# Patient Record
Sex: Female | Born: 1935 | Race: White | Hispanic: No | Smoking: Never smoker
Health system: Southern US, Community
[De-identification: ages and names within clinical notes are randomized; demographics above are authoritative.]

## PROBLEM LIST (undated history)

## (undated) DIAGNOSIS — I10 Essential (primary) hypertension: Secondary | ICD-10-CM

## (undated) DIAGNOSIS — G20A1 Parkinson's disease without dyskinesia, without mention of fluctuations: Secondary | ICD-10-CM

## (undated) DIAGNOSIS — G20 Parkinson's disease: Secondary | ICD-10-CM

## (undated) DIAGNOSIS — F0280 Dementia in other diseases classified elsewhere without behavioral disturbance: Secondary | ICD-10-CM

## (undated) DIAGNOSIS — M4800 Spinal stenosis, site unspecified: Secondary | ICD-10-CM

## (undated) DIAGNOSIS — F329 Major depressive disorder, single episode, unspecified: Secondary | ICD-10-CM

## (undated) DIAGNOSIS — Z8739 Personal history of other diseases of the musculoskeletal system and connective tissue: Secondary | ICD-10-CM

## (undated) DIAGNOSIS — M159 Polyosteoarthritis, unspecified: Secondary | ICD-10-CM

## (undated) DIAGNOSIS — F32A Depression, unspecified: Secondary | ICD-10-CM

## (undated) DIAGNOSIS — E039 Hypothyroidism, unspecified: Secondary | ICD-10-CM

## (undated) HISTORY — DX: Parkinson's disease without dyskinesia, without mention of fluctuations: G20.A1

## (undated) HISTORY — DX: Personal history of other diseases of the musculoskeletal system and connective tissue: Z87.39

## (undated) HISTORY — DX: Major depressive disorder, single episode, unspecified: F32.9

## (undated) HISTORY — DX: Parkinson's disease: G20

## (undated) HISTORY — DX: Dementia in other diseases classified elsewhere, unspecified severity, without behavioral disturbance, psychotic disturbance, mood disturbance, and anxiety: F02.80

## (undated) HISTORY — DX: Depression, unspecified: F32.A

## (undated) HISTORY — DX: Spinal stenosis, site unspecified: M48.00

## (undated) HISTORY — DX: Essential (primary) hypertension: I10

## (undated) HISTORY — DX: Hypothyroidism, unspecified: E03.9

## (undated) HISTORY — DX: Polyosteoarthritis, unspecified: M15.9

---

## 1975-01-21 HISTORY — PX: ABDOMINAL HYSTERECTOMY: SHX81

## 2004-01-21 HISTORY — PX: SPINE SURGERY: SHX786

## 2012-01-21 HISTORY — PX: SKIN GRAFT: SHX250

## 2012-01-21 HISTORY — PX: SKIN DEBRIDEMENT: SHX5235

## 2012-09-30 ENCOUNTER — Encounter: Payer: Self-pay | Admitting: *Deleted

## 2012-10-07 ENCOUNTER — Ambulatory Visit (INDEPENDENT_AMBULATORY_CARE_PROVIDER_SITE_OTHER): Payer: Medicare Other | Admitting: Internal Medicine

## 2012-10-07 ENCOUNTER — Encounter: Payer: Self-pay | Admitting: Internal Medicine

## 2012-10-07 VITALS — BP 160/78 | HR 82 | Temp 97.4°F | Resp 20 | Ht 63.19 in | Wt 217.8 lb

## 2012-10-07 DIAGNOSIS — M4800 Spinal stenosis, site unspecified: Secondary | ICD-10-CM

## 2012-10-07 DIAGNOSIS — F3289 Other specified depressive episodes: Secondary | ICD-10-CM

## 2012-10-07 DIAGNOSIS — F0280 Dementia in other diseases classified elsewhere without behavioral disturbance: Secondary | ICD-10-CM

## 2012-10-07 DIAGNOSIS — M159 Polyosteoarthritis, unspecified: Secondary | ICD-10-CM

## 2012-10-07 DIAGNOSIS — E039 Hypothyroidism, unspecified: Secondary | ICD-10-CM

## 2012-10-07 DIAGNOSIS — E669 Obesity, unspecified: Secondary | ICD-10-CM

## 2012-10-07 DIAGNOSIS — I10 Essential (primary) hypertension: Secondary | ICD-10-CM

## 2012-10-07 DIAGNOSIS — G20 Parkinson's disease: Secondary | ICD-10-CM

## 2012-10-07 DIAGNOSIS — L298 Other pruritus: Secondary | ICD-10-CM

## 2012-10-07 DIAGNOSIS — G20A1 Parkinson's disease without dyskinesia, without mention of fluctuations: Secondary | ICD-10-CM | POA: Insufficient documentation

## 2012-10-07 DIAGNOSIS — F329 Major depressive disorder, single episode, unspecified: Secondary | ICD-10-CM

## 2012-10-07 DIAGNOSIS — F32A Depression, unspecified: Secondary | ICD-10-CM

## 2012-10-07 DIAGNOSIS — F458 Other somatoform disorders: Secondary | ICD-10-CM

## 2012-10-07 DIAGNOSIS — Z23 Encounter for immunization: Secondary | ICD-10-CM

## 2012-10-07 NOTE — Progress Notes (Signed)
Patient ID: Andrea Dorsey, female   DOB: [birth year], 79 y.o.   MRN: [id number] Location:  [facility] / [place] [facility]  Code Status: DNR, daughter, Andrea Dorsey, is HCPOA and here with her today   Allergies  Allergen Reactions  . Ivp Dye [Iodinated Diagnostic Agents]     Only when intravenous, not on external skin.    Chief Complaint  Patient presents with  . Establish Care    itching in torso area,vaccine requested:flu, zostavax, pneumonia    HPI: Patient is a 79 y.o. black female seen in the office today to establish with the practice.  She previously lived in [place] and went to [facility].      Parkinson's 10-12 years ago.  Dementia only developed recently after hospitalization in March 2014 for necrotizing fasciitis--2 surgeries in 2 wks, then skin graft, then rehab for 4 mos--had considerable change in behavior.  Hallucinations--visual primarily.  Doesn't sleep hardly at all at night.  Some sundowning occurs.  Not falling lately--had 2 previous episodes of sliding down the side of the bed.  She has lived with her daughter and son in law off and on for several years.  She was staying off and on with her son, also living in [place].  Had cellulitis this past summer, as well in August--was in jaw cavity.  Had all teeth extracted.  Dr. Geryl Dorsey is dentist.  Had final check up last week.  Right now getting help with home health aide 3x weekly to help with bathing and dresssing.  PT/OT to come two times weekly.  Has nurse also coming to check on the wound which has healed.  Has made good progress with PT.  Mobility has improved by 50% per daughter, Andrea Dorsey.  Needs help dressing.  Is able to get out of lift chair and walk 50 feet on her own.  Andrea Dorsey is home health service.  Andrea Dorsey has taken her to a long term care facility to look.  She objects to this.  Her daughter is exhausted.  Biggest challenge is at night.  Does not want her on sleeping pills all night.  Having someone  there overnight and fresh resources in the day seem ideal.  House in [place] not set up for her needs, and caretaker full time could not offer adequate socialization.  Looked at [facility], [id number], [employer] in [place] (best option now--small, has memory care).    Has arthritis pain in knees.  Also has spinal stenosis--feels weak in her lower back--can't lay on her back.  Cannot pull herself up easily, but can get up using armrests.  Uses tylenol for pain.  Stopped Rx pain meds because they made her other problems (cognitive) worse.  Naproxen was affecting kidneys.    BP at home 128-132 systolic.  If over 140, is to take diovan.    Has been a while since cholesterol was checked.  Not on medication for it--had been borderline.  Sleeps only a few hours, then wakes up.  Has trouble truly sleeping again after that.  Her neurologist (Dr. Alma Dorsey at [facility]) prescribed a medication that started with c that would help her sleep.  Tried melatonin, but didn't help.  Occasionally trying to use melatonin.    Itching is diffuse.  Her daughter has tried different creams including antifungal, steroid, regular moisturizing cream of various brands without relief.  There is no rash.  There is a burning sensation with the itching and it moves around.  Her mood seems poor.  She does not have a diagnosis of depression up to this point.  I get the impression that she considers herself a burden to her family, but wants to move back to her original home and [place] and is not happy anywhere else.    Review of Systems:  Review of Systems  Constitutional: Positive for malaise/fatigue.  HENT: Positive for hearing loss. Negative for congestion and sore throat.   Eyes: Positive for blurred vision and photophobia. Negative for pain and redness.       New glasses 1.5 yrs ago "did not work" so she doesn't wear them, has excessive tearing that is worse in the mornings with light exposure  Respiratory:  Negative for cough and shortness of breath.   Cardiovascular: Positive for leg swelling. Negative for chest pain and palpitations.       Left greater than right  Gastrointestinal: Negative for heartburn, constipation, blood in stool and melena.  Genitourinary: Positive for urgency. Negative for dysuria and frequency.       Urge incontinence--uses depends sometimes--has good and bad days with urine control  Musculoskeletal: Positive for back pain and joint pain. Negative for myalgias and falls.  Skin: Positive for itching. Negative for rash.  Neurological: Positive for dizziness, tremors and weakness. Negative for sensory change, seizures, loss of consciousness and headaches.  Endo/Heme/Allergies: Does not bruise/bleed easily.  Psychiatric/Behavioral: Positive for depression and memory loss. The patient has insomnia. The patient is not nervous/anxious.     Past Medical History  Diagnosis Date  . Hypertension   . Thyroid disease   . Arthritis   . Parkinson disease   . Spinal stenosis     Past Surgical History  Procedure Laterality Date  . Skin debridement  2014    Brambhelt, MD  . Skin graft  2014    Brambhelt MD  . Spine surgery  2006    spinal stenosis  . Abdominal hysterectomy  1977    Social History:   reports that she has never smoked. She does not have any smokeless tobacco history on file. She reports that she does not drink alcohol or use illicit drugs.  Family History  Problem Relation Age of Onset  . Heart disease Mother   . Heart disease Sister   . Hypertension Sister   . Stroke Sister   . Heart disease Sister     heart attack  . Cancer Sister     colon    Medications: Patient's Medications  New Prescriptions   No medications on file  Previous Medications   ACETAMINOPHEN (TYLENOL) 500 MG TABLET    Take 500 mg by mouth every 6 (six) hours as needed for pain.   AMLODIPINE (NORVASC) 5 MG TABLET    Take 5 mg by mouth daily.   CARBIDOPA-LEVODOPA (SINEMET IR)  25-100 MG PER TABLET    Take 1 tablet by mouth 3 (three) times daily.   DM-APAP-CPM (CORICIDIN HBP FLU) 15-500-2 MG TABS    Take by mouth as needed.   ENTACAPONE (COMTAN) 200 MG TABLET    Take 200 mg by mouth 3 (three) times daily.   LEVOTHYROXINE (SYNTHROID, LEVOTHROID) 25 MCG TABLET    Take 25 mcg by mouth daily before breakfast.   METOPROLOL SUCCINATE (TOPROL-XL) 25 MG 24 HR TABLET    Take 25 mg by mouth daily.    METOPROLOL TARTRATE (LOPRESSOR) 25 MG TABLET    Take 25 mg by mouth daily.   NYSTATIN (MYCOSTATIN/NYSTOP) 100000 UNIT/GM POWD  Apply topically as needed.   RANITIDINE (ZANTAC) 150 MG CAPSULE    Take 150 mg by mouth as needed.    SILVER SULFADIAZINE (SILVADENE) 1 % CREAM    Apply topically as needed.    VALSARTAN (DIOVAN) 160 MG TABLET    Take 160 mg by mouth 2 (two) times daily.   Modified Medications   No medications on file  Discontinued Medications   MULTIPLE VITAMINS-MINERALS (MULTIVITAMIN WITH MINERALS) TABLET    Take 1 tablet by mouth daily.     Physical Exam: Filed Vitals:   10/07/12 0906  BP: 160/78  Pulse: 82  Temp: 97.4 F (36.3 C)  TempSrc: Oral  Resp: 20  Height: 5' 3.19" (1.605 m)  Weight: 217 lb 12.8 oz (98.793 kg)  SpO2: 99%  Physical Exam  Constitutional: No distress.  Obese black female in wheelchair here with her daughter  HENT:  Head: Normocephalic and atraumatic.  Right Ear: External ear normal.  Left Ear: External ear normal.  Nose: Nose normal.  Mouth/Throat: Oropharynx is clear and moist. No oropharyngeal exudate.  edentulous  Eyes: Conjunctivae and EOM are normal. Pupils are equal, round, and reactive to light. No scleral icterus.  Neck: Normal range of motion. No JVD present. No tracheal deviation present. No thyromegaly present.  Cardiovascular: Normal rate, regular rhythm, normal heart sounds and intact distal pulses.  Exam reveals no gallop and no friction rub.   No murmur heard. Pulmonary/Chest: Effort normal and breath sounds  normal. No respiratory distress.  Abdominal: Soft. Bowel sounds are normal. She exhibits no distension and no mass. There is no tenderness.  Musculoskeletal: Normal range of motion. She exhibits edema and tenderness.  Right leg with evidence of graft at ankle and tenderness there, left with 2+ pitting edema  Lymphadenopathy:    She has no cervical adenopathy.  Neurological: She is alert. No cranial nerve deficit. She exhibits abnormal muscle tone. Coordination abnormal.  Oriented to person and place, not time;  Mild cogwheeling rigidity of left upper extremity, resting tremor of left greater than right upper extremity, slight slowing and dyscoordination of left side vs. right  Skin: Skin is warm and dry. No rash noted. She is not diaphoretic.  Graft site right lower leg   Labs reviewed: No recent labs provided in records so far  Past Procedures: No recent procedures provided in records so far  Assessment/Plan 1. Parkinson disease - moderate at this time, is still able to ambulate within her home and in the office today, no dysphagia or hypersalivation - ? Whether her chronic pruritis is a neurogenic component related to her Parkinson's -referred to [facility] movement disorders specialist for chronic PD mgt--she is going to switch back to stalevo due to insurance costs at present and it is easier than two separate pills - CBC with Differential - Comprehensive metabolic panel - Vitamin D, 25-hydroxy - Ambulatory referral to Neurology  2. Spinal stenosis -chronic lumbar, worse in when lying down, no longer walks enough for it to bother her with increased activity -uses tylenol only due to adverse effects of other pain medications (see hpi)  3. Hypothyroidism -cont current synthroid dose and f/u TSH  4. Osteoarthritis, generalized -worst in knees, encouraged walking to help with pain  5. Benign essential hypertension -at goal at home--daughter monitors--advised to keep  <150/90 due to age, fall risk and suspected orthostatic hypotension at times  6. Depression - seems somewhat situational, but may also be due to her PD -not currently on medication  for this -no SI/HI -will monitor her moods due to risks of interactions with PD meds  7. Dementia in Parkinson's disease -will need to do MMSE at [facility] - Ambulatory referral to Neurology  -not on any dementia meds -has had visual hallucinations  8. Obesity (BMI 30-39.9) - will monitor weight--cannot do much exercise due to debility from PD, spinal stenosis and OA -does watch diet - Hemoglobin A1c - Lipid panel  9. Need for prophylactic vaccination and inoculation against influenza -given flu shot  10. Neurogenic pruritus -unclear etiology--seems she has no significant physical cause--I wonder if this is neurologic or psychologic at this point  Labs/tests ordered:  Cbc, cmp, flp, hba1c, tsh today Next appt: 6 wks for EV, MMSE

## 2012-10-07 NOTE — Progress Notes (Signed)
Patient ID: Andrea Dorsey, female   DOB: 15-Mar-1929, 79 y.o.   MRN: [id number] Location:  [facility]  Code Status:   Allergies  Allergen Reactions  . Ivp Dye [Iodinated Diagnostic Agents]     Only when intravenous, not on external skin.    Chief Complaint  Patient presents with  . Establish Care    itching in torso area,vaccine requested:flu, zostavax, pneumonia    HPI: Patient is a 79 y.o. black female seen in the office today to establish care.  Review of Systems:  ROS   Past Medical History  Diagnosis Date  . Hypertension   . Thyroid disease   . Arthritis   . Parkinson disease   . Spinal stenosis     Past Surgical History  Procedure Laterality Date  . Skin debridement  2014    Brambhelt, MD  . Skin graft  2014    Brambhelt MD  . Spine surgery  2006    spinal stenosis  . Abdominal hysterectomy  1977    Social History:   reports that she has never smoked. She does not have any smokeless tobacco history on file. She reports that she does not drink alcohol or use illicit drugs.  Family History  Problem Relation Age of Onset  . Heart disease Mother   . Heart disease Sister   . Hypertension Sister   . Stroke Sister   . Heart disease Sister     heart attack  . Cancer Sister     colon    Medications: Patient's Medications  New Prescriptions   No medications on file  Previous Medications   ACETAMINOPHEN (TYLENOL) 500 MG TABLET    Take 500 mg by mouth every 6 (six) hours as needed for pain.   AMLODIPINE (NORVASC) 5 MG TABLET    Take 5 mg by mouth daily.   CARBIDOPA-LEVODOPA (SINEMET IR) 25-100 MG PER TABLET    Take 1 tablet by mouth 3 (three) times daily.   DM-APAP-CPM (CORICIDIN HBP FLU) 15-500-2 MG TABS    Take by mouth as needed.   ENTACAPONE (COMTAN) 200 MG TABLET    Take 200 mg by mouth 3 (three) times daily.   LEVOTHYROXINE (SYNTHROID, LEVOTHROID) 25 MCG TABLET    Take 25 mcg by mouth daily before breakfast.    METOPROLOL SUCCINATE (TOPROL-XL) 25 MG 24 HR TABLET    Take 25 mg by mouth daily.    METOPROLOL TARTRATE (LOPRESSOR) 25 MG TABLET    Take 25 mg by mouth daily.   NYSTATIN (MYCOSTATIN/NYSTOP) 100000 UNIT/GM POWD    Apply topically as needed.   RANITIDINE (ZANTAC) 150 MG CAPSULE    Take 150 mg by mouth as needed.    SILVER SULFADIAZINE (SILVADENE) 1 % CREAM    Apply topically as needed.    VALSARTAN (DIOVAN) 160 MG TABLET    Take 160 mg by mouth 2 (two) times daily.   Modified Medications   No medications on file  Discontinued Medications   MULTIPLE VITAMINS-MINERALS (MULTIVITAMIN WITH MINERALS) TABLET    Take 1 tablet by mouth daily.     Physical Exam: Filed Vitals:   10/07/12 0906  BP: 160/78  Pulse: 82  Temp: 97.4 F (36.3 C)  TempSrc: Oral  Resp: 20  Height: 5' 3.19" (1.605 m)  Weight: 217 lb 12.8 oz (98.793 kg)  SpO2: 99%    Physical Exam   Labs reviewed:   Past Procedures:  Assessment/Plan No problem-specific assessment &  plan notes found for this encounter.    Labs/tests ordered: Next appt:

## 2012-10-07 NOTE — Patient Instructions (Signed)
May stop entacapone and sinemet and resume stalevo.   I recommend you establish with a neurologist here.   [facility] is a nice place to consider for long term care.   I recommend you only take diovan if systolic blood pressure is higher than 150 due to risk of orthostatic hypotension

## 2012-10-08 LAB — COMPREHENSIVE METABOLIC PANEL
ALT: 7 IU/L (ref 0–32)
AST: 15 IU/L (ref 0–40)
Albumin/Globulin Ratio: 1.8 (ref 1.1–2.5)
Albumin: 4 g/dL (ref 3.5–4.7)
Alkaline Phosphatase: 91 IU/L (ref 39–117)
BUN/Creatinine Ratio: 11 (ref 11–26)
BUN: 12 mg/dL (ref 8–27)
CO2: 22 mmol/L (ref 18–29)
Calcium: 9.9 mg/dL (ref 8.6–10.2)
Chloride: 104 mmol/L (ref 97–108)
Creatinine, Ser: 1.12 mg/dL — ABNORMAL HIGH (ref 0.57–1.00)
GFR calc Af Amer: 53 mL/min/{1.73_m2} — ABNORMAL LOW (ref >59)
GFR calc non Af Amer: 46 mL/min/{1.73_m2} — ABNORMAL LOW (ref >59)
Globulin, Total: 2.2 g/dL (ref 1.5–4.5)
Glucose: 105 mg/dL — ABNORMAL HIGH (ref 65–99)
Potassium: 4.2 mmol/L (ref 3.5–5.2)
Sodium: 142 mmol/L (ref 134–144)
Total Bilirubin: 0.9 mg/dL (ref 0.0–1.2)
Total Protein: 6.2 g/dL (ref 6.0–8.5)

## 2012-10-08 LAB — TSH: TSH: 3.7 u[IU]/mL (ref 0.450–4.500)

## 2012-10-08 LAB — LIPID PANEL
Chol/HDL Ratio: 3.5 ratio units (ref 0.0–4.4)
Cholesterol, Total: 207 mg/dL — ABNORMAL HIGH (ref 100–199)
HDL: 59 mg/dL (ref >39)
LDL Calculated: 133 mg/dL — ABNORMAL HIGH (ref 0–99)
Triglycerides: 74 mg/dL (ref 0–149)
VLDL Cholesterol Cal: 15 mg/dL (ref 5–40)

## 2012-10-08 LAB — CBC WITH DIFFERENTIAL/PLATELET
Basophils Absolute: 0 10*3/uL (ref 0.0–0.2)
Basos: 1 %
Eos: 3 %
Eosinophils Absolute: 0.2 10*3/uL (ref 0.0–0.4)
HCT: 39.1 % (ref 34.0–46.6)
Hemoglobin: 13.1 g/dL (ref 11.1–15.9)
Immature Grans (Abs): 0 10*3/uL (ref 0.0–0.1)
Immature Granulocytes: 0 %
Lymphocytes Absolute: 1.4 10*3/uL (ref 0.7–3.1)
Lymphs: 23 %
MCH: 31.2 pg (ref 26.6–33.0)
MCHC: 33.5 g/dL (ref 31.5–35.7)
MCV: 93 fL (ref 79–97)
Monocytes Absolute: 0.6 10*3/uL (ref 0.1–0.9)
Monocytes: 9 %
Neutrophils Absolute: 3.9 10*3/uL (ref 1.4–7.0)
Neutrophils Relative %: 64 %
RBC: 4.2 x10E6/uL (ref 3.77–5.28)
RDW: 14.7 % (ref 12.3–15.4)
WBC: 6.1 10*3/uL (ref 3.4–10.8)

## 2012-10-08 LAB — VITAMIN D 25 HYDROXY (VIT D DEFICIENCY, FRACTURES): Vit D, 25-Hydroxy: 21.6 ng/mL — ABNORMAL LOW (ref 30.0–100.0)

## 2012-10-08 LAB — HEMOGLOBIN A1C
Est. average glucose Bld gHb Est-mCnc: 114 mg/dL
Hgb A1c MFr Bld: 5.6 % (ref 4.8–5.6)

## 2012-10-11 ENCOUNTER — Ambulatory Visit (INDEPENDENT_AMBULATORY_CARE_PROVIDER_SITE_OTHER): Payer: Medicare Other | Admitting: Internal Medicine

## 2012-10-11 DIAGNOSIS — Z111 Encounter for screening for respiratory tuberculosis: Secondary | ICD-10-CM

## 2012-10-11 NOTE — Progress Notes (Signed)
Patient ID: Andrea Dorsey, female   DOB: 1929-04-16, 79 y.o.   MRN: [id number] PPD was placed today for TB screening by CMA.

## 2012-10-13 ENCOUNTER — Telehealth: Payer: Self-pay

## 2012-10-13 LAB — TB SKIN TEST
Induration: 0 mm
TB Skin Test: NEGATIVE

## 2012-10-13 NOTE — Telephone Encounter (Signed)
Please write pt's name and hospital bed on one of my blank scripts and I will sign this and add the diagnoses and reasons she needs it when I am there tomorrow so medicare will cover it.  They can pick up the prescription on Friday morning.

## 2012-10-13 NOTE — Telephone Encounter (Signed)
RX placed on ledge for provider. [name] (patient's daughter) aware to pick-up rx on Friday. [name] indicated she may decide to have rx faxed but she will call to let me know

## 2012-10-13 NOTE — Telephone Encounter (Signed)
Patient was here today for PPD Skin Test Reading. Patient's daughter request order for hospital bed. Patient's daughter would like to pick rx/order up when available. Please call, ok to leave detailed message on voicemail  Last OV 10/07/2012, please advise on request.

## 2012-10-14 NOTE — Telephone Encounter (Signed)
Spoke with patient's daughter, per patient's daughter- [facility]

## 2012-10-15 ENCOUNTER — Ambulatory Visit (INDEPENDENT_AMBULATORY_CARE_PROVIDER_SITE_OTHER): Payer: Medicare Other | Admitting: Neurology

## 2012-10-15 ENCOUNTER — Encounter: Payer: Self-pay | Admitting: Neurology

## 2012-10-15 VITALS — BP 154/72 | HR 87 | Temp 98.3°F | Ht 63.0 in | Wt 212.0 lb

## 2012-10-15 DIAGNOSIS — G4752 REM sleep behavior disorder: Secondary | ICD-10-CM

## 2012-10-15 DIAGNOSIS — G20A1 Parkinson's disease without dyskinesia, without mention of fluctuations: Secondary | ICD-10-CM

## 2012-10-15 DIAGNOSIS — G4733 Obstructive sleep apnea (adult) (pediatric): Secondary | ICD-10-CM

## 2012-10-15 DIAGNOSIS — F411 Generalized anxiety disorder: Secondary | ICD-10-CM

## 2012-10-15 DIAGNOSIS — F419 Anxiety disorder, unspecified: Secondary | ICD-10-CM

## 2012-10-15 DIAGNOSIS — F329 Major depressive disorder, single episode, unspecified: Secondary | ICD-10-CM

## 2012-10-15 DIAGNOSIS — F32A Depression, unspecified: Secondary | ICD-10-CM

## 2012-10-15 DIAGNOSIS — R413 Other amnesia: Secondary | ICD-10-CM

## 2012-10-15 DIAGNOSIS — G20 Parkinson's disease: Secondary | ICD-10-CM

## 2012-10-15 DIAGNOSIS — G4700 Insomnia, unspecified: Secondary | ICD-10-CM

## 2012-10-15 MED ORDER — QUETIAPINE FUMARATE 25 MG PO TABS: ORAL_TABLET | ORAL | Status: DC

## 2012-10-15 NOTE — Progress Notes (Signed)
Subjective:    Patient ID: Andrea Dorsey is a 79 y.o. female.  HPI  Huston Foley, MD, PhD [facility] [address] [address] [place], [place] [address]  Dear Dr. Renato Gails,  I saw your patient, Andrea Dorsey, upon your kind request in my neurologic clinic today for initial consultation of her Parkinson's disease. The patient is accompanied by her daughter, Andrea Dorsey, today. As you know, Ms. Lukasiewicz is a very pleasant 79 year old right-handed woman with an underlying medical history of spinal stenosis, hypothyroidism, osteoarthritis, hypertension, depression, obesity, who previously used to see doctors at [facility], when she lived in [place], [place]. She was diagnosed with Parkinson's about 12 years ago when she was still residing in [place]. and has had complications with memory loss, behavioral problems, hallucinations, sleep disruption. She needs assistance with her ADLs. She has been living with her daughter and son-in-law and they have looked into the possibility of a long-term care facility but the patient has been resistant. She has had problems at night including sundowning, confusion, inability to sleep. Her symptoms started on one side with tremors, but the patient is not sure which side. She is very quiet and states, she gets very nervous. She is initially not able to give much in the way of history at all, but then opens up. She has been on Stalevo for the past 2 years, and prior to that she was on C/L, and prior to that she was on Amantadine. It does not sound like she has tried a dopamine agonist or rasagiline in the past. She has been experiencing nausea with her PD medications and still has occasional nausea. In March she was in [place] visiting her son. She developed necrotizing fasciitis and needed debridement and grafting. She developed hallucinations at the time, but was on pain medications at the time, but the [facility] persisted beyond that. She also  started having memory loss then. She came back to [facility] in July, but developed cellulitis with complications in her jaw and needed all remaining teeth removed and had IV antibiotics. She has no FHx of PD, no dementia. She has no Hx of psychiatric premorbid illness. In 2006 she had back surgery. She has no exposure to chemicals, agent orange. She has been an anxious person. She is not able to sleep at night. She was tried on Ambien and amitriptyline. She has difficulty with sleep onset and sleep maintenance.  She has occasional urinary incontinence, occasional constipations. She snores, and needed to have a sleep study, but did not go. She has had some dream enactments and has slid out of bed.   She has had more anxiety and recently also some depression. She is very opposed to going into assisted living by her daughter and her son-in-law has visited multiple places and after her daughter took me aside later she revealed that they have actually finalized on an assisted living facility for the patient and that she will be transitioned in one week. The patient at times becomes tearful during her visit. She understands that she has a complex medical history and multiple issues and advanced Parkinson's disease. She understands that she is not safe to live by herself.  Her Past Medical History Is Significant For: Past Medical History  Diagnosis Date  . Benign essential hypertension   . Hypothyroidism   . Osteoarthritis, generalized   . Parkinson disease   . Spinal stenosis   . History of necrotizing fascIItis     left leg,  s/p debridement and graft  . Dementia in Parkinson's disease   . Depression     Her Past Surgical History Is Significant For: Past Surgical History  Procedure Laterality Date  . Skin debridement  2014    Brambhelt, MD  . Skin graft  2014    Brambhelt MD  . Spine surgery  2006    spinal stenosis  . Abdominal hysterectomy  1977    Her Family History Is Significant For: Family  History  Problem Relation Age of Onset  . Heart disease Mother   . Heart disease Sister   . Hypertension Sister   . Stroke Sister   . Heart disease Sister     heart attack  . Cancer Sister     colon    Her Social History Is Significant For: History   Social History  . Marital Status: Widowed    Spouse Name: N/A    Number of Children: N/A  . Years of Education: N/A   Social History Main Topics  . Smoking status: Never Smoker   . Smokeless tobacco: None  . Alcohol Use: No  . Drug Use: No  . Sexual Activity: None   Other Topics Concern  . None   Social History Narrative  . None    Her Allergies Are:  Allergies  Allergen Reactions  . Ivp Dye [Iodinated Diagnostic Agents]     Only when intravenous, not on external skin.  :   Her Current Medications Are:  Outpatient Encounter Prescriptions as of 10/15/2012  Medication Sig Dispense Refill  . acetaminophen (TYLENOL) 500 MG tablet Take 500 mg by mouth every 6 (six) hours as needed for pain.      Marland Kitchen amLODipine (NORVASC) 5 MG tablet Take 5 mg by mouth daily.      . carbidopa-levodopa-entacapone (STALEVO) 37.5-150-200 MG per tablet Take 1 tablet by mouth 4 (four) times daily.      Marland Kitchen levothyroxine (SYNTHROID, LEVOTHROID) 25 MCG tablet Take 25 mcg by mouth daily before breakfast.      . metoprolol succinate (TOPROL-XL) 25 MG 24 hr tablet Take 25 mg by mouth daily.       Marland Kitchen nystatin (MYCOSTATIN/NYSTOP) 100000 UNIT/GM POWD Apply topically as needed.      . ranitidine (ZANTAC) 150 MG capsule Take 150 mg by mouth as needed.       . entacapone (COMTAN) 200 MG tablet Take 200 mg by mouth 3 (three) times daily.      . metoprolol tartrate (LOPRESSOR) 25 MG tablet Take 25 mg by mouth daily.      . silver sulfADIAZINE (SILVADENE) 1 % cream Apply topically as needed.       . valsartan (DIOVAN) 160 MG tablet Take 160 mg by mouth 2 (two) times daily.       . [DISCONTINUED] carbidopa-levodopa (SINEMET IR) 25-100 MG per tablet Take 1 tablet  by mouth 3 (three) times daily.      . [DISCONTINUED] DM-APAP-CPM (CORICIDIN HBP FLU) 15-500-2 MG TABS Take by mouth as needed.       No facility-administered encounter medications on file as of 10/15/2012.  : Review of Systems:  Out of a complete 14 point review of systems, all are reviewed and negative with the exception of these symptoms as listed below:  Review of Systems  Constitutional: Positive for chills and activity change.  HENT: Positive for hearing loss.   Respiratory: Positive for shortness of breath.        Snoring  Cardiovascular: Positive  for leg swelling (ankles).  Gastrointestinal: Positive for nausea and constipation.       Incontinence  Endocrine: Positive for cold intolerance.  Genitourinary:       Incontinence  Musculoskeletal: Positive for myalgias, back pain, joint swelling, arthralgias and gait problem.  Skin: Positive for rash.  Neurological: Positive for dizziness, tremors and weakness.       Restless leg  Psychiatric/Behavioral: Positive for hallucinations, confusion, sleep disturbance and dysphoric mood. The patient is nervous/anxious.     Objective:  Neurologic Exam  Physical Exam Physical Examination:   Filed Vitals:   10/15/12 0830  BP: 154/72  Pulse: 87  Temp: 98.3 F (36.8 C)    General Examination: The patient is a very pleasant 79 y.o. female in no acute distress. She is obese.   HEENT: Normocephalic, atraumatic, pupils are equal, round and reactive to light and accommodation. Extraocular tracking shows moderate saccadic breakdown without nystagmus noted. There is limitation to upper gaze. There is mild decrease in eye blink rate. Hearing is impaired mildly. Tympanic membranes are clear bilaterally. Face is symmetric with moderate facial masking and normal facial sensation. There is no lip, neck or jaw tremor. Neck is moderately rigid with intact passive ROM. There are no carotid bruits on auscultation. Oropharynx exam reveals mild mouth  dryness. There is significant airway crowding noted d/t redundant soft palate and large tongue. Mallampati is class III. Tongue protrudes centrally and palate elevates symmetrically. There is mild drooling.   Chest: is clear to auscultation without wheezing, rhonchi or crackles noted.  Heart: sounds are regular and normal without murmurs, rubs or gallops noted.   Abdomen: is soft, non-tender and non-distended with normal bowel sounds appreciated on auscultation.  Extremities: There is 3+ pitting edema in the distal lower extremities bilaterally. Pedal pulses are intact. Chronic stasis-like changes are noted in the distal legs bilaterally with s/p skin grafting. There are no varicose veins.  Skin: is warm and dry with no trophic changes noted. Age-related changes are noted on the skin.   Musculoskeletal: exam reveals no obvious joint deformities, tenderness, joint swelling or erythema.  Neurologically:  Mental status: The patient is awake and alert, paying good  attention. She is able to partially provide the history. Her daughter provides details or most of the entire history. She is oriented to: person, place, time/date and situation. Her memory, attention, language and knowledge are impaired mildly. There is no aphasia, agnosia, apraxia or anomia. There is a moderate degree of bradyphrenia. Speech is moderately hypophonic with mild dysarthria noted. Mood is congruent and affect is normal.    Cranial nerves are as described above under HEENT exam. In addition, shoulder shrug is normal with equal shoulder height noted.  Motor exam: Normal bulk, and strength for age is noted. There are no dyskinesias noted.  Tone is mildly rigid with presence of cogwheeling in the right upper extremity. There is overall moderate bradykinesia. There is no drift or rebound.  There is an intermittent mild to moderate UE resting tremor.  Romberg is not tested.   Reflexes are 1+ in the upper extremities and trace in  the knees and absent in the ankle.   Fine motor skills exam: Finger taps are moderately impaired on the right and mildly impaired on the left. Hand movements are moderately impaired on the right and mildly impaired on the left. RAP (rapid alternating patting) is mildly impaired on the right and mildly impaired on the left. Foot taps are moderately impaired on the right  and moderately impaired on the left. Foot agility (in the form of heel stomping) is moderately impaired on the right and moderately impaired on the left.    Cerebellar testing shows no dysmetria or intention tremor on finger to nose testing. Heel to shin is unremarkable bilaterally. There is no truncal or gait ataxia.   Sensory exam is intact to light touch, pinprick, vibration, temperature sense and proprioception in the upper and lower extremities.   Gait, station and balance: She stands up from the seated position with mild difficulty and does need to push up with Her hands. She needs some assistance. No veering to one side is noted. She is not noted to lean to the side. Posture is moderately stooped. Stance is wide-based. She walks with decrease in stride length and pace and Has to rely on her rolling walker which she brought in today. Tandem walk is not possible. Balance is moderately impaired. She is not able to do a toe or heel stance.     Most of my 70 minute visit with them was spent in counseling and coordination of care, reviewing history and medications.  Assessment and Plan:   In summary, Heather Mckendree is a very pleasant 79 y.o.-year old female with an underlying medical history of spinal stenosis, hypothyroidism, osteoarthritis, hypertension, depression, obesity, necrotizing fasciitis of her right leg, history of cellulitis of her jaw, who presents to establish care for her Parkinson's disease which seems to be right-sided predominant and with symptoms dating back to about 12 years ago. Her PD is complicated by  hallucinations, mood disorder including anxiety and depression, sleep disorder including insomnia as well as obstructive sleep apnea as well as REM behavior disorder. I spent a very long time with the patient and her daughter today, discussing her symptoms, her advanced Parkinson's disease, or complications, were fall risk, her mood disorder and her sleep disorder at length. I suggested that she continue with Stalevo 4 times a day at this time. For her mood disorder I would like to hold off on any immune medication but have encouraged him to discuss with you next month during her appointment the possibility of getting started on low-dose antidepressant perhaps Lexapro or Effexor XR. In the interim, at this juncture, I think we are to try a small dose of Seroquel. Having said that, I am aware that antipsychotics have to be used very cautiously in the elderly. I advised her daughter in that regard as well including the slightly increased risk of death in the elderly when we use antipsychotic medications. Nevertheless, she is not a good candidate for Ambien and not a good candidate for a benzodiazepine I believe. To that end, I would like to introduce a cautious dose of Seroquel at 6.25 mg each night for a week then increase it to 12.5 mg each night thereafter. In a month or so we can consider increasing this to a whole pill which would be 25 mg. I have encouraged her daughter to call back in that regard. Down the road, we should embark on testing her for obstructive sleep apnea. She has a history of loud snoring and given her obesity and tight airway I do believe that she has some degree of obstructive sleep apnea. The patient in the past has been reluctant to pursue a sleep study. Before we pursue a sleep study at this point, I think we need to help her with her sleep. I believe the Seroquel could potentially help her with her nighttime hallucinations, her  RBD and her sleep consolidation at this moment. I would like  to see her back in 3 months from now, sooner if the need arises. She may have a difficult transition into her new living situation and that can potentially give her a setback as well. We have to be mindful of this. I do believe that she will benefit from an antidepressant with antianxiety her upper gaze. She and her daughter were in agreement. I answered all their questions today and have encouraged [name] to call in about a month to discuss her sleep and to potentially schedule a sleep study.  Thank you very much for allowing me to participate in the care of this nice patient. If I can be of any further assistance to you please do not hesitate to call me at [phone].  Sincerely,   Huston Foley, MD, PhD

## 2012-10-15 NOTE — Patient Instructions (Addendum)
I think overall you are doing fairly well but I do want to suggest a few things today:  Remember to drink plenty of fluid, eat healthy meals and do not skip any meals. Try to eat protein with a every meal and eat a healthy snack such as fruit or nuts in between meals. Try to keep a regular sleep-wake schedule and try to exercise daily, particularly in the form of walking, 20-30 minutes a day, if you can.   Engage in social activities in your community and with your family and try to keep up with current events by reading the newspaper or watching the news.   As far as your medications are concerned, I would like to suggest a trial of Seroquel 25 mg strength: Take 1/4 pill each night for 1 week, then 1/2 pill each night thereafter. We may go up to whole pill later. Side effects include sleepiness, confusion, personality changes, balance problems. Elderly on antipsychotics may have a 1.4 to 1.6 times higher change of death. Call in 1 month to report about sleep and scheduling a sleep study.   Based on your symptoms and your exam I believe you are at risk for obstructive sleep apnea or OSA, and I think we should proceed with a sleep study to determine whether you do or do not have OSA and how severe it is. If you have more than mild OSA, I want you to consider treatment with CPAP. Please remember, the risks and ramifications of moderate to severe obstructive sleep apnea or OSA are: Cardiovascular disease, including congestive heart failure, stroke, difficult to control hypertension, arrhythmias, and even type 2 diabetes has been linked to untreated OSA. Sleep apnea causes disruption of sleep and sleep deprivation in most cases, which, in turn, can cause recurrent headaches, problems with memory, mood, concentration, focus, and vigilance. Most people with untreated sleep apnea report excessive daytime sleepiness, which can affect their ability to drive. Please do not drive if you feel sleepy.  I will see you  back after your sleep study to go over the test results and where to go from there. We will call you after your sleep study and to set up an appointment at the time.    You may need an antidepressant, talk to Dr. Renato Gails about starting something like Lexapro or Effexor XR at your next visit on 11/15/12.   As far as diagnostic testing: we will do a sleep study in about a month.    I would like to see you back in 3 months, sooner if we need to. Please call us with any interim questions, concerns, problems, updates or refill requests.  Please also call us for any test results so we can go over those with you on the phone. Brett Canales is my clinical assistant and will answer any of your questions and relay your messages to me and also relay most of my messages to you.  Our phone number is [phone]. We also have an after hours call service for urgent matters and there is a physician on-call for urgent questions. For any emergencies you know to call 911 or go to the nearest emergency room.

## 2012-10-26 ENCOUNTER — Telehealth: Payer: Self-pay | Admitting: *Deleted

## 2012-10-26 NOTE — Telephone Encounter (Signed)
Patient daughter [name] called and wants to know if you finished the [place] Forms she dropped off to you last Thursday, she would like to pick these up.

## 2012-10-26 NOTE — Telephone Encounter (Signed)
I believe I did and I put them in the outgoing box.

## 2012-11-08 ENCOUNTER — Emergency Department (HOSPITAL_COMMUNITY)
Admission: EM | Admit: 2012-11-08 | Discharge: 2012-11-08 | Disposition: A | Discharge status: Home / Self Care | Payer: Medicare Other | Source: Home / Self Care | Attending: Family Medicine | Admitting: Family Medicine

## 2012-11-08 ENCOUNTER — Encounter (HOSPITAL_COMMUNITY): Payer: Self-pay | Admitting: Emergency Medicine

## 2012-11-08 ENCOUNTER — Emergency Department (HOSPITAL_COMMUNITY): Payer: Medicare Other

## 2012-11-08 ENCOUNTER — Inpatient Hospital Stay (HOSPITAL_COMMUNITY)
Admission: EM | Admit: 2012-11-08 | Discharge: 2012-11-11 | DRG: 292 | Disposition: A | Discharge status: Home Health | Payer: Medicare Other | Attending: Internal Medicine | Admitting: Internal Medicine

## 2012-11-08 DIAGNOSIS — F0280 Dementia in other diseases classified elsewhere without behavioral disturbance: Secondary | ICD-10-CM | POA: Diagnosis present

## 2012-11-08 DIAGNOSIS — B49 Unspecified mycosis: Secondary | ICD-10-CM | POA: Diagnosis present

## 2012-11-08 DIAGNOSIS — G20A1 Parkinson's disease without dyskinesia, without mention of fluctuations: Secondary | ICD-10-CM | POA: Diagnosis present

## 2012-11-08 DIAGNOSIS — F329 Major depressive disorder, single episode, unspecified: Secondary | ICD-10-CM

## 2012-11-08 DIAGNOSIS — I10 Essential (primary) hypertension: Secondary | ICD-10-CM | POA: Diagnosis present

## 2012-11-08 DIAGNOSIS — I5031 Acute diastolic (congestive) heart failure: Principal | ICD-10-CM

## 2012-11-08 DIAGNOSIS — I509 Heart failure, unspecified: Secondary | ICD-10-CM | POA: Diagnosis present

## 2012-11-08 DIAGNOSIS — N390 Urinary tract infection, site not specified: Secondary | ICD-10-CM | POA: Diagnosis present

## 2012-11-08 DIAGNOSIS — G20 Parkinson's disease: Secondary | ICD-10-CM | POA: Diagnosis present

## 2012-11-08 DIAGNOSIS — E039 Hypothyroidism, unspecified: Secondary | ICD-10-CM | POA: Diagnosis present

## 2012-11-08 DIAGNOSIS — E669 Obesity, unspecified: Secondary | ICD-10-CM

## 2012-11-08 DIAGNOSIS — F3289 Other specified depressive episodes: Secondary | ICD-10-CM | POA: Diagnosis present

## 2012-11-08 LAB — POCT URINALYSIS DIP (DEVICE)
Bilirubin Urine: NEGATIVE
Glucose, UA: NEGATIVE mg/dL
Ketones, ur: NEGATIVE mg/dL
Protein, ur: 30 mg/dL — AB
Specific Gravity, Urine: 1.025 (ref 1.005–1.030)
Urobilinogen, UA: 0.2 mg/dL (ref 0.0–1.0)
pH: 6.5 (ref 5.0–8.0)

## 2012-11-08 LAB — BASIC METABOLIC PANEL WITH GFR
BUN: 16 mg/dL (ref 6–23)
Calcium: 10.5 mg/dL (ref 8.4–10.5)
Chloride: 105 meq/L (ref 96–112)
Creatinine, Ser: 0.97 mg/dL (ref 0.50–1.10)
GFR calc Af Amer: 62 mL/min — ABNORMAL LOW (ref >90)
GFR calc non Af Amer: 53 mL/min — ABNORMAL LOW (ref >90)

## 2012-11-08 LAB — CREATININE, SERUM
Creatinine, Ser: 1.05 mg/dL (ref 0.50–1.10)
GFR calc Af Amer: 56 mL/min — ABNORMAL LOW (ref >90)
GFR calc non Af Amer: 48 mL/min — ABNORMAL LOW (ref >90)

## 2012-11-08 LAB — CBC
HCT: 38.4 % (ref 36.0–46.0)
Hemoglobin: 12.7 g/dL (ref 12.0–15.0)
MCH: 30 pg (ref 26.0–34.0)
MCH: 30.6 pg (ref 26.0–34.0)
MCHC: 33.1 g/dL (ref 30.0–36.0)
MCV: 90.6 fL (ref 78.0–100.0)
Platelets: 225 K/uL (ref 150–400)
Platelets: 205 10*3/uL (ref 150–400)
RBC: 4.24 MIL/uL (ref 3.87–5.11)
RBC: 3.92 MIL/uL (ref 3.87–5.11)
RDW: 15.1 % (ref 11.5–15.5)
WBC: 7.4 K/uL (ref 4.0–10.5)
WBC: 5.4 10*3/uL (ref 4.0–10.5)

## 2012-11-08 LAB — URINALYSIS, ROUTINE W REFLEX MICROSCOPIC
Bilirubin Urine: NEGATIVE
Glucose, UA: NEGATIVE mg/dL
Ketones, ur: NEGATIVE mg/dL
Nitrite: POSITIVE — AB
Protein, ur: NEGATIVE mg/dL
Urobilinogen, UA: 0.2 mg/dL (ref 0.0–1.0)
pH: 6.5 (ref 5.0–8.0)

## 2012-11-08 LAB — POCT I-STAT TROPONIN I: Troponin i, poc: 0.01 ng/mL (ref 0.00–0.08)

## 2012-11-08 LAB — BASIC METABOLIC PANEL
CO2: 21 mEq/L (ref 19–32)
Glucose, Bld: 102 mg/dL — ABNORMAL HIGH (ref 70–99)
Potassium: 4.4 mEq/L (ref 3.5–5.1)
Sodium: 139 mEq/L (ref 135–145)

## 2012-11-08 LAB — PRO B NATRIURETIC PEPTIDE: Pro B Natriuretic peptide (BNP): 1051 pg/mL — ABNORMAL HIGH (ref 0–450)

## 2012-11-08 LAB — URINE MICROSCOPIC-ADD ON

## 2012-11-08 LAB — TROPONIN I: Troponin I: <0.3 ng/mL (ref <0.30)

## 2012-11-08 MED ORDER — FUROSEMIDE 10 MG/ML IJ SOLN
20.0000 mg | Freq: Two times a day (BID) | INTRAMUSCULAR | Status: DC
  Administered 2012-11-09 – 2012-11-10 (×5): 20 mg via INTRAVENOUS
  Filled 2012-11-08 (×7): qty 2

## 2012-11-08 MED ORDER — DEXTROSE 5 % IV SOLN
1.0000 g | INTRAVENOUS | Status: DC
  Administered 2012-11-09 – 2012-11-10 (×2): 1 g via INTRAVENOUS
  Filled 2012-11-08 (×2): qty 10

## 2012-11-08 MED ORDER — SODIUM CHLORIDE 0.9 % IJ SOLN
3.0000 mL | Freq: Two times a day (BID) | INTRAMUSCULAR | Status: DC
  Administered 2012-11-09 – 2012-11-11 (×6): 3 mL via INTRAVENOUS

## 2012-11-08 MED ORDER — METOPROLOL SUCCINATE ER 25 MG PO TB24
25.0000 mg | ORAL_TABLET | Freq: Every day | ORAL | Status: DC
  Administered 2012-11-09 – 2012-11-11 (×3): 25 mg via ORAL
  Filled 2012-11-08 (×3): qty 1

## 2012-11-08 MED ORDER — SODIUM CHLORIDE 0.9 % IV BOLUS (SEPSIS): 1000.0000 mL | Freq: Once | INTRAVENOUS | Status: DC

## 2012-11-08 MED ORDER — ASPIRIN EC 81 MG PO TBEC
81.0000 mg | DELAYED_RELEASE_TABLET | Freq: Every day | ORAL | Status: DC
  Administered 2012-11-09 – 2012-11-11 (×3): 81 mg via ORAL
  Filled 2012-11-08 (×3): qty 1

## 2012-11-08 MED ORDER — CARBIDOPA-LEVODOPA-ENTACAPONE 37.5-150-200 MG PO TABS
1.0000 | ORAL_TABLET | Freq: Four times a day (QID) | ORAL | Status: DC
  Filled 2012-11-08 (×3): qty 1

## 2012-11-08 MED ORDER — LEVOTHYROXINE SODIUM 25 MCG PO TABS
25.0000 ug | ORAL_TABLET | Freq: Every day | ORAL | Status: DC
  Administered 2012-11-09 – 2012-11-11 (×3): 25 ug via ORAL
  Filled 2012-11-08 (×4): qty 1

## 2012-11-08 MED ORDER — ONDANSETRON HCL 4 MG/2ML IJ SOLN: 4.0000 mg | Freq: Four times a day (QID) | INTRAMUSCULAR | Status: DC | PRN

## 2012-11-08 MED ORDER — ACETAMINOPHEN 500 MG PO TABS
500.0000 mg | ORAL_TABLET | Freq: Four times a day (QID) | ORAL | Status: DC | PRN
  Administered 2012-11-10 – 2012-11-11 (×4): 500 mg via ORAL
  Filled 2012-11-08 (×4): qty 1

## 2012-11-08 MED ORDER — POTASSIUM CHLORIDE CRYS ER 20 MEQ PO TBCR: 20.0000 meq | EXTENDED_RELEASE_TABLET | Freq: Once | ORAL | Status: DC

## 2012-11-08 MED ORDER — SODIUM CHLORIDE 0.9 % IJ SOLN: 3.0000 mL | INTRAMUSCULAR | Status: DC | PRN

## 2012-11-08 MED ORDER — ENTACAPONE 200 MG PO TABS
200.0000 mg | ORAL_TABLET | Freq: Four times a day (QID) | ORAL | Status: DC
  Administered 2012-11-09 – 2012-11-11 (×10): 200 mg via ORAL
  Filled 2012-11-08 (×14): qty 1

## 2012-11-08 MED ORDER — SODIUM CHLORIDE 0.9 % IV SOLN: 250.0000 mL | INTRAVENOUS | Status: DC | PRN

## 2012-11-08 MED ORDER — ENOXAPARIN SODIUM 40 MG/0.4ML ~~LOC~~ SOLN
40.0000 mg | SUBCUTANEOUS | Status: DC
  Administered 2012-11-09 – 2012-11-10 (×3): 40 mg via SUBCUTANEOUS
  Filled 2012-11-08 (×4): qty 0.4

## 2012-11-08 MED ORDER — CARBIDOPA-LEVODOPA 25-100 MG PO TABS
1.5000 | ORAL_TABLET | Freq: Four times a day (QID) | ORAL | Status: DC
  Administered 2012-11-09 – 2012-11-11 (×11): 1.5 via ORAL
  Filled 2012-11-08 (×13): qty 1.5

## 2012-11-08 MED ORDER — FUROSEMIDE 10 MG/ML IJ SOLN
40.0000 mg | Freq: Once | INTRAMUSCULAR | Status: AC
  Administered 2012-11-08: 40 mg via INTRAVENOUS
  Filled 2012-11-08: qty 4

## 2012-11-08 MED ORDER — DEXTROSE 5 % IV SOLN
1.0000 g | INTRAVENOUS | Status: DC
  Filled 2012-11-08: qty 10

## 2012-11-08 MED ORDER — DEXTROSE 5 % IV SOLN
1.0000 g | Freq: Once | INTRAVENOUS | Status: AC
  Administered 2012-11-08: 1 g via INTRAVENOUS
  Filled 2012-11-08: qty 10

## 2012-11-08 NOTE — ED Notes (Signed)
C/o worsening BLE edema, SOB with minimal exertion x 1 week. Denies CP, cold, cough, fever, chills. Also reports dysuria & urinary frequency since Saturday.

## 2012-11-08 NOTE — ED Provider Notes (Signed)
Andrea Dorsey is a 79 y.o. female who presents to Urgent Care today for bilateral lower extremity edema associated with exertional dyspnea. Patient has had worsening lower extremity edema over the past 2 months. This week she developed exertional dyspnea. She denies any significant chest pain or shortness of breath at rest. Additionally she has had some urinary frequency urgency and burning with urination over the past week. She denies any fevers or chills nausea vomiting or diarrhea.  Her past medical history is significant due to dementia secondary to Parkinson's disease with a history of sundowning currently a resident of assisted-living.   She has had any echocardiogram in [place] approximately a year and a half ago which was reportedly normal. Additionally she has a history of necrotizing fasciitis   Past Medical History  Diagnosis Date  . Benign essential hypertension   . Hypothyroidism   . Osteoarthritis, generalized   . Parkinson disease   . Spinal stenosis   . History of necrotizing fascIItis     left leg, s/p debridement and graft  . Dementia in Parkinson's disease   . Depression    History  Substance Use Topics  . Smoking status: Never Smoker   . Smokeless tobacco: Not on file  . Alcohol Use: No   ROS as above Medications reviewed. No current facility-administered medications for this encounter.   Current Outpatient Prescriptions  Medication Sig Dispense Refill  . amLODipine (NORVASC) 5 MG tablet Take 5 mg by mouth daily.      . carbidopa-levodopa-entacapone (STALEVO) 37.5-150-200 MG per tablet Take 1 tablet by mouth 4 (four) times daily.      . entacapone (COMTAN) 200 MG tablet Take 200 mg by mouth 3 (three) times daily.      Marland Kitchen levothyroxine (SYNTHROID, LEVOTHROID) 25 MCG tablet Take 25 mcg by mouth daily before breakfast.      . metoprolol succinate (TOPROL-XL) 25 MG 24 hr tablet Take 25 mg by mouth daily.       . metoprolol tartrate (LOPRESSOR) 25 MG tablet Take  25 mg by mouth daily.      Marland Kitchen nystatin (MYCOSTATIN/NYSTOP) 100000 UNIT/GM POWD Apply topically as needed.      Marland Kitchen QUEtiapine (SEROQUEL) 25 MG tablet Take 1/4 pill each night for 1 week, then 1/2 pill each night thereafter.  30 tablet  5  . ranitidine (ZANTAC) 150 MG capsule Take 150 mg by mouth as needed.       . valsartan (DIOVAN) 160 MG tablet Take 160 mg by mouth 2 (two) times daily.       Marland Kitchen acetaminophen (TYLENOL) 500 MG tablet Take 500 mg by mouth every 6 (six) hours as needed for pain.      . silver sulfADIAZINE (SILVADENE) 1 % cream Apply topically as needed.         Exam:  BP 171/65  Pulse 77  Temp(Src) 98.1 F (36.7 C) (Oral)  Resp 20  SpO2 97% Gen: Well NAD HEENT: EOMI,  MMM Lungs: Slightly increased work of breathing. Bibasilar crackles are present no wheezing Heart: RRR no MRG Abd: NABS, NT, ND Exts: Bilateral 2+ lower extremity edema. The right lower extremity is abnormal and scarred secondary to fasciotomy due to necrotizing fasciitis.  Results for orders placed during the hospital encounter of 11/08/12 (from the past 24 hour(s))  POCT URINALYSIS DIP (DEVICE)     Status: Abnormal   Collection Time    11/08/12  1:38 PM      Result Value Range  Glucose, UA NEGATIVE  NEGATIVE mg/dL   Bilirubin Urine NEGATIVE  NEGATIVE   Ketones, ur NEGATIVE  NEGATIVE mg/dL   Specific Gravity, Urine 1.025  1.005 - 1.030   Hgb urine dipstick NEGATIVE  NEGATIVE   pH 6.5  5.0 - 8.0   Protein, ur 30 (*) NEGATIVE mg/dL   Urobilinogen, UA 0.2  0.0 - 1.0 mg/dL   Nitrite NEGATIVE  NEGATIVE   Leukocytes, UA TRACE (*) NEGATIVE   No results found.  Assessment and Plan: 79 y.o. female with  1) congestive heart failure symptoms. This is somewhat concerning.  I had a lengthy discussion with the patient, and her daughter.  I feel that she would benefit from a workup and evaluation in the emergency room. Ideally she probably would benefit from a proBNP, lab panel, and echo and EKG.  Additionally  she likely would benefit from a course of Lasix with followup with primary care provider.  2) patient's urinary symptoms are somewhat consistent with urinary tract infection.   Plan to transfer to the emergency room via shuttle as patient's vital signs are stable at rest.   Full CODE STATUS     Rodolph Bong, MD 11/08/12 1347

## 2012-11-08 NOTE — ED Provider Notes (Signed)
CSN: [id number]     Arrival date & time 11/08/12  1417 History   First MD Initiated Contact with Patient 11/08/12 1510     Chief Complaint  Patient presents with  . Shortness of Breath  . Urinary Tract Infection   (Consider location/radiation/quality/duration/timing/severity/associated sxs/prior Treatment) Patient is a 79 y.o. female presenting with shortness of breath. The history is provided by the patient, medical records and a caregiver (daughter). The history is limited by the condition of the patient (dementia).  Shortness of Breath Severity:  Moderate Onset quality:  Gradual Timing:  Intermittent Progression:  Partially resolved Chronicity:  Recurrent Context: activity   Relieved by:  Rest, sitting up and diuretics Worsened by:  Exertion Associated symptoms: no abdominal pain, no chest pain, no cough, no diaphoresis, no fever, no headaches, no hemoptysis, no neck pain, no rash, no sore throat, no sputum production, no vomiting and no wheezing   Risk factors: no hx of PE/DVT, no prolonged immobilization and no recent surgery     Past Medical History  Diagnosis Date  . Benign essential hypertension   . Hypothyroidism   . Osteoarthritis, generalized   . Parkinson disease   . Spinal stenosis   . History of necrotizing fascIItis     left leg, s/p debridement and graft  . Dementia in Parkinson's disease   . Depression    Past Surgical History  Procedure Laterality Date  . Skin debridement  2014    Brambhelt, MD  . Skin graft  2014    Brambhelt MD  . Spine surgery  2006    spinal stenosis  . Abdominal hysterectomy  1977   Family History  Problem Relation Age of Onset  . Heart disease Mother   . Heart disease Sister   . Hypertension Sister   . Stroke Sister   . Heart disease Sister     heart attack  . Cancer Sister     colon   History  Substance Use Topics  . Smoking status: Never Smoker   . Smokeless tobacco: Not on file  . Alcohol Use: No   OB History    Grav Para Term Preterm Abortions TAB SAB Ect Mult Living                 Review of Systems  Constitutional: Negative for fever and diaphoresis.  HENT: Negative for sore throat.   Respiratory: Positive for shortness of breath. Negative for cough, hemoptysis, sputum production and wheezing.   Cardiovascular: Negative for chest pain.  Gastrointestinal: Negative for vomiting and abdominal pain.  Musculoskeletal: Negative for arthralgias, myalgias, neck pain and neck stiffness.  Skin: Negative for color change, pallor, rash and wound.  Neurological: Negative for dizziness, weakness and headaches.  All other systems reviewed and are negative.    Allergies  Ivp dye  Home Medications   No current outpatient prescriptions on file. BP 121/52  Pulse 71  Temp(Src) 97.9 F (36.6 C) (Oral)  Resp 20  Ht 5\' 5"  (1.651 m)  Wt 233 lb 12.8 oz (106.051 kg)  BMI 38.91 kg/m2  SpO2 100% Physical Exam  Nursing note and vitals reviewed. Constitutional: She is oriented to person, place, and time. She appears well-developed. No distress.  Chronically ill appearing female in no apparent distress.  HENT:  Head: Normocephalic and atraumatic.  Mouth/Throat: Oropharynx is clear and moist. No oropharyngeal exudate.  Eyes: Conjunctivae and EOM are normal. Pupils are equal, round, and reactive to light.  Neck: Normal range of motion. Neck  supple.  Cardiovascular: Normal rate, regular rhythm and normal heart sounds.  Exam reveals no gallop and no friction rub.   No murmur heard. Pulmonary/Chest: Effort normal. No respiratory distress. She has decreased breath sounds in the right lower field and the left lower field. She has no wheezes. She has rales in the right lower field and the left lower field. She exhibits no tenderness.  Abdominal: Soft. She exhibits no distension. There is no tenderness.  Musculoskeletal: Normal range of motion. She exhibits edema. She exhibits no tenderness.  2+ edema to the  knees bilaterally. Postsurgical changes to the right lower extremity.  Lymphadenopathy:    She has no cervical adenopathy.  Neurological: She is alert and oriented to person, place, and time.  Skin: Skin is warm and dry. No rash noted. She is not diaphoretic.  Psychiatric: She has a normal mood and affect. Her behavior is normal. Judgment and thought content normal.    ED Course  Procedures (including critical care time) Labs Review Labs Reviewed  BASIC METABOLIC PANEL - Abnormal; Notable for the following:    Glucose, Bld 102 (*)    GFR calc non Af Amer 53 (*)    GFR calc Af Amer 62 (*)    All other components within normal limits  PRO B NATRIURETIC PEPTIDE - Abnormal; Notable for the following:    Pro B Natriuretic peptide (BNP) 1051.0 (*)    All other components within normal limits  URINALYSIS, ROUTINE W REFLEX MICROSCOPIC - Abnormal; Notable for the following:    Nitrite POSITIVE (*)    Leukocytes, UA SMALL (*)    All other components within normal limits  URINE MICROSCOPIC-ADD ON - Abnormal; Notable for the following:    Squamous Epithelial / LPF FEW (*)    Bacteria, UA MANY (*)    All other components within normal limits  CBC - Abnormal; Notable for the following:    HCT 35.4 (*)    All other components within normal limits  CREATININE, SERUM - Abnormal; Notable for the following:    GFR calc non Af Amer 48 (*)    GFR calc Af Amer 56 (*)    All other components within normal limits  CBC  TROPONIN I  TROPONIN I  TROPONIN I  BASIC METABOLIC PANEL  TSH  POCT I-STAT TROPONIN I   Imaging Review Dg Chest 2 View  11/08/2012   CLINICAL DATA:  Shortness of breath.  EXAM: CHEST  2 VIEW  COMPARISON:  None.  FINDINGS: The cardio pericardial silhouette is enlarged. Vascular congestion noted with associated interstitial pulmonary edema. Tiny bilateral pleural effusions. Imaged bony structures of the thorax are intact.  IMPRESSION: Cardiomegaly with interstitial pulmonary edema  and tiny bilateral pleural effusions.   Electronically Signed   By: Kennith Center M.D.   On: 11/08/2012 16:21    EKG Interpretation     Ventricular Rate:    PR Interval:    QRS Duration:   QT Interval:    QTC Calculation:   R Axis:     Text Interpretation:              MDM   1. CHF (congestive heart failure)   2. UTI (lower urinary tract infection)   3. Acute diastolic heart failure   4. Dementia in Parkinson's disease   5. Depression     79 year old female with a history of dementia, Parkinson's disease, hypertension who presents with dyspnea on exertion, lower extremity swelling, orthopnea, urinary frequency and  burning. Patient attempted to see her PCP, who was out of town. For that reason, she went to urgent care Center. At urgent care, point-of-care urinalysis was indeterminate. DG symptoms, she was felt to need evaluation for possible congestive heart failure. Patient does not carried a diagnosis of CHF, however during a admission earlier this year at a hospital in [place], she was started on Lasix at that time. At discharge, she was discontinued for unclear reasons. Since that time, she has had waxing and waning swelling of her lower extremities.Symptoms this particular time have been progressively worsening over the last 2 weeks.  On exam, decreased breath sounds bilaterally at the bases, however she has significant wheezes. No hypoxia on room air. 3+ pitting edema to the mid shin bilaterally. Based on symptoms, feel that CHF is most likely. Will evaluate with EKG, chest x-ray, labs. Patient is currently asymptomatic at rest, so the symptoms may be able to make be managed as an outpatient based on laboratory findings. No chest pain and doubt PE, ACS, pneumonia.  Chest x-ray returned showing cardiomegaly with interstitial edema. BNP of 1000 as well as chest x-ray reflective of CHF. Additionally, urinalysis shows nitrite positive urine concerning for UTI based on symptoms  and urinalysis. Rocephin given for UTI and Lasix IV given for CHF. Based on these findings as well as new onset CHF, consult to medicine for admission to the floor for new onset CHF. Admitted to the floor hemodynamically stable.  Patient was discussed with my attending, Dr. Oletta Lamas.   Dorna Leitz, MD 11/09/12 [phone]

## 2012-11-08 NOTE — ED Notes (Signed)
Admitting MD at bedside.

## 2012-11-08 NOTE — ED Provider Notes (Signed)
Medical screening examination/treatment/procedure(s) were conducted as a shared visit with non-physician practitioner(s) and myself.  I personally evaluated the patient during the encounter  Pt with exertional dyspnea, no CP, back pain.  Also symptoms of UTI.  Seen at urgent care and sent to the ED for further eval.  Pt is not actively hypoxic, but no prior h/o CHF.  Pt has cardiomegaly on CXR along with evidence of interstitial edema per radiologist.  Troponin is normal.  Slight UTI.  Plan is to give lasix for symptomatic treatment, would benefit from BP management, diuresis, abx for UTI and providing good follow up including cardiology evaluation at some point.  ECG at time 14:34 shows SR with PAC's and compensatory pause, at rate 70, normal axis, early R wave transition in lead V2, no ST or T wave abn's, acutely.    Gavin Pound. Oletta Lamas, MD 11/08/12 1610

## 2012-11-08 NOTE — ED Notes (Addendum)
Per daughter pt sent here from UC due to swelling to bilateral ankle and SOB x2 months that has increased x1 week, pt was taken to UC today due to burning with urination and urinary urgency and frequency starting Saturday. Pt is a resident of [facility], daughter took her to UC because her pcp was out of town and they were unable to get an order for a urine sample. Pt denies chest pain, fever, chills, or cough

## 2012-11-08 NOTE — ED Notes (Signed)
C/o urinary frequency and dysuria x couple of days.  Swelling in ankles x 1 wk. Sob/cough with walking/being active. Denies fever. Pt has increased h2o and drank cranberry juice with no relief.

## 2012-11-08 NOTE — H&P (Signed)
[facility] History and Physical  Andrea Dorsey ZOX:[id number] DOB: July 07, 1929 DOA: 11/08/2012  Referring physician: Dr Oletta Lamas PCP: Bufford Spikes, DO  Specialists: none  Chief Complaint: increase urinary frequency, SOB on exertion.   HPI: Andrea Dorsey is a 79 y.o. female with PMH significant for Hypertension, hypothyroidism, history of LE necrotizing fascitis S/P debridement and graft April 2014, Parkinson diseases, who presents complaining of worsening SOB for one 1 week, increase lower extremities edema, dyspnea on exertion, orthopnea. She denies chest pain, cough, fever. Patient use walker for ambulation, but has not been able to walk due to pain in her legs and dyspnea.  Patient has history of LE edema on and off for years. Swelling of lower extremities started again recently.  Patient also with increase urinary frequency, dysuria for last couples of days.  Per daughter patient has history of visual hallucinations and parkinson diseases.   Review of Systems: Negative except as per HPI.   Past Medical History  Diagnosis Date  . Benign essential hypertension   . Hypothyroidism   . Osteoarthritis, generalized   . Parkinson disease   . Spinal stenosis   . History of necrotizing fascIItis     left leg, s/p debridement and graft  . Dementia in Parkinson's disease   . Depression    Past Surgical History  Procedure Laterality Date  . Skin debridement  2014    Brambhelt, MD  . Skin graft  2014    Brambhelt MD  . Spine surgery  2006    spinal stenosis  . Abdominal hysterectomy  1977   Social History:  reports that she has never smoked. She does not have any smokeless tobacco history on file. She reports that she does not drink alcohol or use illicit drugs. she lives assistance living facility, [facility].   Allergies  Allergen Reactions  . Ivp Dye [Iodinated Diagnostic Agents]     Only when intravenous, not on external skin.    Family History  Problem Relation Age  of Onset  . Heart disease Mother   . Heart disease Sister   . Hypertension Sister   . Stroke Sister   . Heart disease Sister     heart attack  . Cancer Sister     colon    Prior to Admission medications   Medication Sig Start Date End Date Taking? Authorizing Provider  acetaminophen (TYLENOL) 500 MG tablet Take 500 mg by mouth every 6 (six) hours as needed for pain.   Yes Historical Provider, MD  amLODipine (NORVASC) 5 MG tablet Take 5 mg by mouth daily.   Yes Historical Provider, MD  carbidopa-levodopa-entacapone (STALEVO) 37.5-150-200 MG per tablet Take 1 tablet by mouth 4 (four) times daily.   Yes Historical Provider, MD  levothyroxine (SYNTHROID, LEVOTHROID) 25 MCG tablet Take 25 mcg by mouth daily before breakfast.   Yes Historical Provider, MD  metoprolol succinate (TOPROL-XL) 25 MG 24 hr tablet Take 25 mg by mouth daily.  08/22/12  Yes Historical Provider, MD  nystatin (MYCOSTATIN/NYSTOP) 100000 UNIT/GM POWD Apply topically as needed.   Yes Historical Provider, MD   Physical Exam: Filed Vitals:   11/08/12 1730  BP: 140/56  Pulse: 67  Temp:   Resp: 15   General Appearance:    Alert, cooperative, no distress, appears stated age  Head:    Normocephalic, without obvious abnormality, atraumatic  Eyes:    PERRL, conjunctiva/corneas clear, EOM's intact, fundi    benign, both eyes  Ears:    Normal TM's and  external ear canals, both ears  Nose:   Nares normal, septum midline, mucosa normal, no drainage    or sinus tenderness  Throat:   Lips, mucosa, and tongue normal; no dentition  Neck:   Supple, symmetrical, trachea midline, no adenopathy;    thyroid:  no enlargement/tenderness/nodules; no carotid   Bruit,  positive JVD plus 14  Back:     Symmetric, no curvature, ROM normal, no CVA tenderness  Lungs:      Bilateral crackles, sporadic wheezes, respirations unlabored  Chest Wall:    No tenderness or deformity   Heart:    Regular rate and rhythm, S1 and S2 normal, no murmur, rub    or gallop     Abdomen:     Soft, non-tender, bowel sounds active all four quadrants,    no masses, no organomegaly        Extremities:   Right LE with old scar, no erythema. Left LE plus 2 edema.   Pulses:   2+ and symmetric all extremities     Lymph nodes:   Cervical, supraclavicular, and axillary nodes normal  Neurologic:   CNII-XII intact, normal strength, sensation and reflexes    throughout      Labs on Admission:  Basic Metabolic Panel:  Recent Labs Lab 11/08/12 1532  NA 139  K 4.4  CL 105  CO2 21  GLUCOSE 102*  BUN 16  CREATININE 0.97  CALCIUM 10.5   Liver Function Tests: No results found for this basename: AST, ALT, ALKPHOS, BILITOT, PROT, ALBUMIN,  in the last 168 hours No results found for this basename: LIPASE, AMYLASE,  in the last 168 hours No results found for this basename: AMMONIA,  in the last 168 hours CBC:  Recent Labs Lab 11/08/12 1532  WBC 7.4  HGB 12.7  HCT 38.4  MCV 90.6  PLT 225   Cardiac Enzymes: No results found for this basename: CKTOTAL, CKMB, CKMBINDEX, TROPONINI,  in the last 168 hours  BNP (last 3 results)  Recent Labs  11/08/12 1532  PROBNP 1051.0*   CBG: No results found for this basename: GLUCAP,  in the last 168 hours  Radiological Exams on Admission: Dg Chest 2 View  11/08/2012   CLINICAL DATA:  Shortness of breath.  EXAM: CHEST  2 VIEW  COMPARISON:  None.  FINDINGS: The cardio pericardial silhouette is enlarged. Vascular congestion noted with associated interstitial pulmonary edema. Tiny bilateral pleural effusions. Imaged bony structures of the thorax are intact.  IMPRESSION: Cardiomegaly with interstitial pulmonary edema and tiny bilateral pleural effusions.   Electronically Signed   By: Kennith Center M.D.   On: 11/08/2012 16:21    EKG: Independently reviewed.  Assessment/Plan Active Problems:   Hypothyroidism   Benign essential hypertension   Dementia in Parkinson's disease   Acute diastolic heart  failure  1-Acute Heart Failure exacerbation: suspect diastolic. Presents with dyspnea, orthopnea, worsening LE edema, increase BNP at 1051, chest x ray with interstitial edema and pleural effusion. Admit to telemetry, cycle cardiac enzymes. IV lasix 20 mg BID. KCl as needed. Strict I and O, daily weight. Check ECHO. Hold Norvasc can worsen LE edema.   2-UTI: presents with increase urinary frequency, dysuria. UA with positive nitrates. Follow up urine culture. IV ceftriaxone.  3-Parkinson diseases: Continue with levodopa.  4-Hypothyroidism: continue with synthroid. Check TSH.  5-   Code Status: Full Code.  Family Communication: Care discussed with Daughter who is POA,.  Disposition Plan: expect 3 to 4  Days impatient.  Time spent: 65 minutes.   Payeton Germani [facility] Pager [phone]  If 7PM-7AM, please contact night-coverage [email or web address] Password [facility] 11/08/2012, 6:27 PM

## 2012-11-09 DIAGNOSIS — N390 Urinary tract infection, site not specified: Secondary | ICD-10-CM

## 2012-11-09 DIAGNOSIS — I10 Essential (primary) hypertension: Secondary | ICD-10-CM

## 2012-11-09 DIAGNOSIS — E669 Obesity, unspecified: Secondary | ICD-10-CM

## 2012-11-09 DIAGNOSIS — I379 Nonrheumatic pulmonary valve disorder, unspecified: Secondary | ICD-10-CM

## 2012-11-09 LAB — BASIC METABOLIC PANEL
CO2: 24 mEq/L (ref 19–32)
GFR calc Af Amer: 61 mL/min — ABNORMAL LOW (ref >90)
GFR calc non Af Amer: 53 mL/min — ABNORMAL LOW (ref >90)
Potassium: 3.9 mEq/L (ref 3.5–5.1)
Sodium: 142 mEq/L (ref 135–145)

## 2012-11-09 LAB — TROPONIN I: Troponin I: <0.3 ng/mL (ref <0.30)

## 2012-11-09 MED ORDER — HALOPERIDOL LACTATE 5 MG/ML IJ SOLN: 1.0000 mg | Freq: Four times a day (QID) | INTRAMUSCULAR | Status: DC | PRN

## 2012-11-09 MED ORDER — FUROSEMIDE 10 MG/ML IJ SOLN
INTRAMUSCULAR | Status: AC
  Filled 2012-11-09: qty 4

## 2012-11-09 MED ORDER — LISINOPRIL 5 MG PO TABS
5.0000 mg | ORAL_TABLET | Freq: Every day | ORAL | Status: DC
  Administered 2012-11-09 – 2012-11-11 (×3): 5 mg via ORAL
  Filled 2012-11-09 (×3): qty 1

## 2012-11-09 NOTE — Care Management Note (Signed)
    Page 1 of 2   11/09/2012     11:40:59 AM   CARE MANAGEMENT NOTE 11/09/2012  Patient:  [facility]   Account Number:  [id number]  Date Initiated:  11/09/2012  Documentation initiated by:  [facility]  Subjective/Objective Assessment:   79 yo female admitted with CHF//resident of [facility]     Action/Plan:   IV diurese// return to [facility] with HHPT through [facility]   Anticipated DC Date:  11/12/2012   Anticipated DC Plan:  HOME W HOME HEALTH SERVICES  In-house referral  Clinical Social Worker      DC Associate Professor  CM consult      [facility] Choice  HOME HEALTH   Choice offered to / List presented to:  C-1 Patient        HH arranged  HH-2 PT  HH-1 RN  HH-10 DISEASE MANAGEMENT      HH agency  [facility]   Status of service:  Completed, signed off Medicare Important Message given?   (If response is "NO", the following Medicare IM given date fields will be blank) Date Medicare IM given:   Date Additional Medicare IM given:    Discharge Disposition:    Per UR Regulation:    If discussed at Long Length of Stay Meetings, dates discussed:    Comments:  11/09/12 1115 Oletta Cohn, RN, BSN, [employer] [phone] Spoke with pt at bedside regarding discharge planning.  Pt a resident of [facility].  Physical Therapy reccommends HHPT; NCM added RN for disease management.  Pt given list of HH providers.  Pt states that she will accept preferred agency of ALF.  NCM called ALF to verify that preferred [facility] agency is [facility]  Corrie Dandy of [facility] notified.

## 2012-11-09 NOTE — Evaluation (Signed)
Physical Therapy Evaluation Patient Details Name: Andrea Dorsey MRN: [id number] DOB: 07-12-1929 Today's Date: 11/09/2012 Time: 7829-5621 PT Time Calculation (min): 32 min  PT Assessment / Plan / Recommendation History of Present Illness  Andrea Dorsey is a 79 y.o. female with PMH significant for Hypertension, hypothyroidism, history of LE necrotizing fascitis S/P debridement and graft April 2014, Parkinson diseases, who presents complaining of worsening SOB for one 1 week, increase lower extremities edema, dyspnea on exertion, orthopnea. She denies chest pain, cough, fever. Patient use walker for ambulation, but has not been able to walk due to pain in her legs and dyspnea.    Clinical Impression  Pt admitted with dyspnea and increase LE swelling. Pt currently with functional limitations due to the deficits listed below (see PT Problem List). Pt poor historian and unsure of level assistance provided at d/c.  Per RN pt is from ALF.  Will continue to assess best d/c plans. If ALF able to provide appropriate assistance then may return with HHPT.  Pt will benefit from skilled PT to increase their independence and safety with mobility to allow discharge to the venue listed below.      PT Assessment  Patient needs continued PT services    Follow Up Recommendations  Home health PT (@ ALF if accurate information) - need to further assess overall caregiver support    Equipment Recommendations  None recommended by PT    Frequency Min 3X/week    Precautions / Restrictions Precautions Precautions: Fall Restrictions Weight Bearing Restrictions: No   Pertinent Vitals/Pain C/o left flank pain but does not rate      Mobility  Bed Mobility Bed Mobility: Supine to Sit;Sitting - Scoot to Edge of Bed Supine to Sit: 4: Min assist;With rails;HOB elevated Sitting - Scoot to [place] of Bed: 3: Mod assist;With rail Details for Bed Mobility Assistance: (A) to elevate trunk OOB and (A) with pad to scoot  hips to EOB.  Transfers Transfers: Sit to Stand;Stand to Sit;Stand Pivot Transfers Sit to Stand: 3: Mod assist;From bed;From chair/3-in-1 Stand to Sit: 4: Min assist;To chair/3-in-1 Stand Pivot Transfers: 3: Mod assist;From elevated surface Details for Transfer Assistance: Pt able to improve with sit <> stand after several trials.  Pt initial mod (A) and able to improve to min (A).  Max cues for hand placement and proper technique.  Decrease (A) needed with higher surfaces.  Pt reports having lift chair at thome.  Ambulation/Gait Ambulation/Gait Assistance: 4: Min assist Ambulation Distance (Feet): 20 Feet Assistive device: Rolling walker Ambulation/Gait Assistance Details: (A) to maintain balance especially with turns and max cues for RW placement and body position within RW.  Gait Pattern: Step-through pattern;Decreased stride length;Wide base of support;Trunk flexed Gait velocity: decreased Stairs: No    Exercises     PT Diagnosis: Difficulty walking;Generalized weakness  PT Problem List: Decreased strength;Decreased activity tolerance;Decreased balance;Decreased mobility;Decreased knowledge of use of DME;Cardiopulmonary status limiting activity;Pain PT Treatment Interventions: DME instruction;Gait training;Functional mobility training;Therapeutic activities;Therapeutic exercise;Balance training;Cognitive remediation;Patient/family education     PT Goals(Current goals can be found in the care plan section) Acute Rehab PT Goals Patient Stated Goal: To get washed up.  PT Goal Formulation: Patient unable to participate in goal setting Time For Goal Achievement: 11/16/12 Potential to Achieve Goals: Good  Visit Information  Last PT Received On: 11/09/12 Assistance Needed: +1 History of Present Illness: Andrea Dorsey is a 80 y.o. female with PMH significant for Hypertension, hypothyroidism, history of LE necrotizing fascitis S/P debridement and graft April 2014,  Parkinson diseases,  who presents complaining of worsening SOB for one 1 week, increase lower extremities edema, dyspnea on exertion, orthopnea. She denies chest pain, cough, fever. Patient use walker for ambulation, but has not been able to walk due to pain in her legs and dyspnea.         Prior Functioning  Home Living Family/patient expects to be discharged to:: Assisted living Home Equipment: Walker - 2 wheels Additional Comments: Pt very inconsistent with home environment and PLOF.  No family present at this time.  Pt did report having and RN to assist her with ADLs and overall mobility.  Prior Function Level of Independence: Needs assistance ADL's / Homemaking Assistance Needed: Pt reports having RN to assist with ADLS.  However pt consistent with answers.  Communication Communication: No difficulties    Cognition  Cognition Arousal/Alertness: Awake/alert Behavior During Therapy: WFL for tasks assessed/performed Overall Cognitive Status: No family/caregiver present to determine baseline cognitive functioning (per chart dementia) Memory: Decreased short-term memory    Extremity/Trunk Assessment Lower Extremity Assessment Lower Extremity Assessment: Generalized weakness   Balance Balance Balance Assessed: Yes Static Standing Balance Static Standing - Balance Support: Bilateral upper extremity supported Static Standing - Level of Assistance: 5: Stand by assistance Static Standing - Comment/# of Minutes: ~1 minutes while pt needed total (A) to complete pericare  End of Session PT - End of Session Equipment Utilized During Treatment: Gait belt Activity Tolerance: Patient limited by fatigue Patient left: in chair;with call bell/phone within reach Nurse Communication: Mobility status  GP     [name] 11/09/2012, 11:17 AM  Jake Shark, PT DPT [phone]

## 2012-11-09 NOTE — Progress Notes (Signed)
[facility] PROGRESS NOTE Assessment/Plan:  Acute diastolic heart failure: - estimated dry weight 212 lb, now 225 lb cont daily weight. - cont lasix replete electrolytes as needed. - echo pending, no events on telemetry, strict I and o's daily weight. - cont metoprolol and lisinopril.  UTI: - Rocephin UC pending.  Hypothyroidism - cont synthroid.  Benign essential hypertension -  mildly high add lisinopril  Dementia in Parkinson's disease - cont meds    Code Status: Full Code.  Family Communication: Care discussed with Daughter who is POA,.  Disposition Plan: expect 3 to 4 Days impatient.     Consultants:  none  Procedures:  Echo pending  Antibiotics:  rocephin (indicate start date, and stop date if known)  HPI/Subjective: Breathing is better.  Objective: Filed Vitals:   11/08/12 1830 11/08/12 1939 11/09/12 0148 11/09/12 0504  BP: 140/63 121/52 125/62 138/54  Pulse: 70 71 68 64  Temp:  97.9 F (36.6 C)  98.5 F (36.9 C)  TempSrc:  Oral  Oral  Resp: 24 20 20 20   Height:  5\' 5"  (1.651 m)    Weight:  106.051 kg (233 lb 12.8 oz)  102.059 kg (225 lb)  SpO2: 96% 100% 98% 97%    Intake/Output Summary (Last 24 hours) at 11/09/12 0935 Last data filed at 11/09/12 0825  Gross per 24 hour  Intake    480 ml  Output    200 ml  Net    280 ml   Filed Weights   11/08/12 1437 11/08/12 1939 11/09/12 0504  Weight: 96.163 kg (212 lb) 106.051 kg (233 lb 12.8 oz) 102.059 kg (225 lb)    Exam:  General: Alert, awake, oriented x3, in no acute distress.  HEENT: No bruits, no goiter. No JVD Heart: Regular rate and rhythm, without murmurs, rubs, gallops.  Lungs: Good air movement, clear to auscultion  Abdomen: Soft, nontender, nondistended, positive bowel sounds.  Neuro: Grossly intact, nonfocal.   Data Reviewed: Basic Metabolic Panel:  Recent Labs Lab 11/08/12 1532 11/08/12 1940 11/09/12 0530  NA 139  --  142  K 4.4  --  3.9  CL 105  --  107  CO2  21  --  24  GLUCOSE 102*  --  108*  BUN 16  --  15  CREATININE 0.97 1.05 0.98  CALCIUM 10.5  --  9.4   Liver Function Tests: No results found for this basename: AST, ALT, ALKPHOS, BILITOT, PROT, ALBUMIN,  in the last 168 hours No results found for this basename: LIPASE, AMYLASE,  in the last 168 hours No results found for this basename: AMMONIA,  in the last 168 hours CBC:  Recent Labs Lab 11/08/12 1532 11/08/12 1940  WBC 7.4 5.4  HGB 12.7 12.0  HCT 38.4 35.4*  MCV 90.6 90.3  PLT 225 205   Cardiac Enzymes:  Recent Labs Lab 11/08/12 1827 11/09/12 0115 11/09/12 0530  TROPONINI <0.30 <0.30 <0.30   BNP (last 3 results)  Recent Labs  11/08/12 1532  PROBNP 1051.0*   CBG: No results found for this basename: GLUCAP,  in the last 168 hours  No results found for this or any previous visit (from the past 240 hour(s)).   Studies: Dg Chest 2 View  11/08/2012   CLINICAL DATA:  Shortness of breath.  EXAM: CHEST  2 VIEW  COMPARISON:  None.  FINDINGS: The cardio pericardial silhouette is enlarged. Vascular congestion noted with associated interstitial pulmonary edema. Tiny bilateral pleural effusions. Imaged bony structures of  the thorax are intact.  IMPRESSION: Cardiomegaly with interstitial pulmonary edema and tiny bilateral pleural effusions.   Electronically Signed   By: Kennith Center M.D.   On: 11/08/2012 16:21    Scheduled Meds: . aspirin EC  81 mg Oral Daily  . carbidopa-levodopa  1.5 tablet Oral QID   And  . entacapone  200 mg Oral QID  . cefTRIAXone (ROCEPHIN)  IV  1 g Intravenous Q24H  . enoxaparin (LOVENOX) injection  40 mg Subcutaneous Q24H  . furosemide      . furosemide  20 mg Intravenous BID  . levothyroxine  25 mcg Oral QAC breakfast  . metoprolol succinate  25 mg Oral Daily  . potassium chloride  20 mEq Oral Once  . sodium chloride  3 mL Intravenous Q12H   Continuous Infusions:    Marinda Elk  [facility] Pager [phone]. If 8PM-8AM,  please contact night-coverage at [email or web address], password [facility] 11/09/2012, 9:35 AM  LOS: 1 day

## 2012-11-09 NOTE — Progress Notes (Signed)
  Echocardiogram 2D Echocardiogram has been performed.  Andrea Dorsey 11/09/2012, 8:46 AM

## 2012-11-10 DIAGNOSIS — E039 Hypothyroidism, unspecified: Secondary | ICD-10-CM

## 2012-11-10 LAB — BASIC METABOLIC PANEL
BUN: 16 mg/dL (ref 6–23)
CO2: 26 mEq/L (ref 19–32)
Calcium: 9.5 mg/dL (ref 8.4–10.5)
Chloride: 106 mEq/L (ref 96–112)
Creatinine, Ser: 1.06 mg/dL (ref 0.50–1.10)
GFR calc Af Amer: 56 mL/min — ABNORMAL LOW (ref >90)
GFR calc non Af Amer: 48 mL/min — ABNORMAL LOW (ref >90)
Glucose, Bld: 101 mg/dL — ABNORMAL HIGH (ref 70–99)
Potassium: 3.6 mEq/L (ref 3.5–5.1)

## 2012-11-10 MED ORDER — TRIAMCINOLONE ACETONIDE 0.1 % EX CREA
TOPICAL_CREAM | Freq: Two times a day (BID) | CUTANEOUS | Status: DC
  Administered 2012-11-10 – 2012-11-11 (×2): via TOPICAL
  Filled 2012-11-10: qty 15

## 2012-11-10 NOTE — Progress Notes (Signed)
Physical Therapy Treatment Patient Details Name: Andrea Dorsey MRN: [id number] DOB: 20-May-1929 Today's Date: 11/10/2012 Time: 1015-1040 PT Time Calculation (min): 25 min  PT Assessment / Plan / Recommendation  History of Present Illness Andrea Dorsey is a 79 y.o. female with PMH significant for Hypertension, hypothyroidism, history of LE necrotizing fascitis S/P debridement and graft April 2014, Parkinson diseases, who presents complaining of worsening SOB for one 1 week, increase lower extremities edema, dyspnea on exertion, orthopnea. She denies chest pain, cough, fever. Patient use walker for ambulation, but has not been able to walk due to pain in her legs and dyspnea.     PT Comments   *Pt progressing with mobility, increased ambulation distance today. Performed BLE exercises. **  Follow Up Recommendations  Home health PT (@ ALF if accurate information)     Does the patient have the potential to tolerate intense rehabilitation     Barriers to Discharge        Equipment Recommendations  None recommended by PT    Recommendations for Other Services    Frequency Min 3X/week   Progress towards PT Goals Progress towards PT goals: Progressing toward goals  Plan Current plan remains appropriate    Precautions / Restrictions Precautions Precautions: Fall Restrictions Weight Bearing Restrictions: No   Pertinent Vitals/Pain *Pt reports B antecubital fossa (previous IV sites) pain and itching. RN notified. **    Mobility  Transfers Transfers: Sit to Stand;Stand to Sit Sit to Stand: From chair/3-in-1;4: Min assist Stand to Sit: 4: Min assist;To chair/3-in-1 Details for Transfer Assistance: Min A to rise and for balance, pt initially had posterior lean in standing with RW. Sit to stand x 2. VCs for hand placement. Ambulation/Gait Ambulation/Gait Assistance: 4: Min assist Ambulation Distance (Feet): 35 Feet Assistive device: Rolling walker Gait Pattern: Step-through  pattern;Decreased stride length;Wide base of support;Trunk flexed Gait velocity: decreased Stairs: No    Exercises General Exercises - Lower Extremity Ankle Circles/Pumps: AROM;Both;10 reps;Seated Long Arc Quad: AROM;Both;20 reps;Seated Hip Flexion/Marching: AROM;Both;10 reps;Seated   PT Diagnosis:    PT Problem List:   PT Treatment Interventions:     PT Goals (current goals can now be found in the care plan section) Acute Rehab PT Goals Patient Stated Goal: to get stronger PT Goal Formulation: With patient Time For Goal Achievement: 11/16/12 Potential to Achieve Goals: Good  Visit Information  Last PT Received On: 11/10/12 Assistance Needed: +1 History of Present Illness: Andrea Dorsey is a 79 y.o. female with PMH significant for Hypertension, hypothyroidism, history of LE necrotizing fascitis S/P debridement and graft April 2014, Parkinson diseases, who presents complaining of worsening SOB for one 1 week, increase lower extremities edema, dyspnea on exertion, orthopnea. She denies chest pain, cough, fever. Patient use walker for ambulation, but has not been able to walk due to pain in her legs and dyspnea.      Subjective Data  Patient Stated Goal: to get stronger   Cognition  Cognition Arousal/Alertness: Awake/alert Behavior During Therapy: WFL for tasks assessed/performed Overall Cognitive Status: Within Functional Limits for tasks assessed (per chart dementia)    Balance  Balance Balance Assessed: Yes Static Standing Balance Static Standing - Balance Support: Bilateral upper extremity supported Static Standing - Level of Assistance: 5: Stand by assistance Static Standing - Comment/# of Minutes: 1.5 minute with RW for pericare  End of Session PT - End of Session Equipment Utilized During Treatment: Gait belt Activity Tolerance: Patient limited by fatigue Patient left: in chair;with call bell/phone within reach  Nurse Communication: Mobility status   GP      Ralene Bathe Kistler 11/10/2012, 10:48 AM [phone]

## 2012-11-10 NOTE — Progress Notes (Signed)
[facility] PROGRESS NOTE Assessment/Plan:  Acute diastolic heart failure: -patient back to estimated dry weight 208-210 lb  -last day of IV lasix; continue electrolytes repletion as needed  -echo with preserved EF, no wall motion abnormalities and suggesting diastolic heart failure -cont metoprolol and lisinopril. -troponin neg X 3  UTI: -will continue Rocephin for now, while inpatient; will change to ceftin BID and complete 5-7 days of treatment total for non-complicated UTI -culture was never sent out  Hypothyroidism -cont synthroid. -TSH WNL  Benign essential hypertension -better control with new regimen -follow low sodoium diet -further adjustment in 2 weeks during follow up with PCP  Dementia in Parkinson's disease - cont home meds   Deconditioning -HHPT at ALF  Skin fungal infection -will treat with triamcinolone BID  Code Status: Full Code.  Family Communication: Care discussed with Daughter who is POA,.  Disposition Plan: back to ALF in am.    Consultants:  none  Procedures:  2- D echo: hypertrophy, with an appearance suggesting concentric remodeling (increased wall thickness with normal wall mass), consistent with hypertensive changes. Systolic function was normal. Features are consistent with a pseudonormal left ventricular filling pattern, with concomitant abnormal relaxation and increased filling pressure (grade 2 diastolic dysfunction).   Antibiotics:  rocephin (indicate start date, and stop date if known)  HPI/Subjective: Feeling better. Denies CP, abd pain, nausea, vomiting or fever. Breathing a lot better  Objective: Filed Vitals:   11/09/12 1456 11/09/12 2122 11/10/12 0557 11/10/12 1458  BP: 133/59 102/50 147/57 124/59  Pulse: 61 66 71 72  Temp: 97.1 F (36.2 C) 98.2 F (36.8 C) 98 F (36.7 C) 98.5 F (36.9 C)  TempSrc: Oral Oral Oral Oral  Resp: 20 18 18 18   Height:      Weight:   100.29 kg (221 lb 1.6 oz)   SpO2: 98%  96% 94% 97%    Intake/Output Summary (Last 24 hours) at 11/10/12 1636 Last data filed at 11/10/12 1548  Gross per 24 hour  Intake    462 ml  Output    850 ml  Net   -388 ml   Filed Weights   11/08/12 1939 11/09/12 0504 11/10/12 0557  Weight: 106.051 kg (233 lb 12.8 oz) 102.059 kg (225 lb) 100.29 kg (221 lb 1.6 oz)    Exam:  General: Alert, awake, oriented x3, in no acute distress.  HEENT: No bruits, no goiter. No JVD Heart: Regular rate and rhythm, without murmurs, rubs, gallops.  Lungs: Good air movement, clear to auscultion  Abdomen: Soft, nontender, nondistended, positive bowel sounds.  Neuro: Grossly intact, nonfocal.   Data Reviewed: Basic Metabolic Panel:  Recent Labs Lab 11/08/12 1532 11/08/12 1940 11/09/12 0530 11/10/12 0510  NA 139  --  142 142  K 4.4  --  3.9 3.6  CL 105  --  107 106  CO2 21  --  24 26  GLUCOSE 102*  --  108* 101*  BUN 16  --  15 16  CREATININE 0.97 1.05 0.98 1.06  CALCIUM 10.5  --  9.4 9.5   CBC:  Recent Labs Lab 11/08/12 1532 11/08/12 1940  WBC 7.4 5.4  HGB 12.7 12.0  HCT 38.4 35.4*  MCV 90.6 90.3  PLT 225 205   Cardiac Enzymes:  Recent Labs Lab 11/08/12 1827 11/09/12 0115 11/09/12 0530  TROPONINI <0.30 <0.30 <0.30   BNP (last 3 results)  Recent Labs  11/08/12 1532  PROBNP 1051.0*   Studies: No results found.  Scheduled Meds: .  aspirin EC  81 mg Oral Daily  . carbidopa-levodopa  1.5 tablet Oral QID   And  . entacapone  200 mg Oral QID  . cefTRIAXone (ROCEPHIN)  IV  1 g Intravenous Q24H  . enoxaparin (LOVENOX) injection  40 mg Subcutaneous Q24H  . furosemide  20 mg Intravenous BID  . levothyroxine  25 mcg Oral QAC breakfast  . lisinopril  5 mg Oral Daily  . metoprolol succinate  25 mg Oral Daily  . potassium chloride  20 mEq Oral Once  . sodium chloride  3 mL Intravenous Q12H  . triamcinolone cream   Topical BID   Continuous Infusions:    Fleming Prill  [facility] Pager [phone]. If  8PM-8AM, please contact night-coverage at [email or web address], password [facility] 11/10/2012, 4:36 PM  LOS: 2 days

## 2012-11-11 ENCOUNTER — Other Ambulatory Visit: Payer: Self-pay | Admitting: Internal Medicine

## 2012-11-11 DIAGNOSIS — I10 Essential (primary) hypertension: Secondary | ICD-10-CM

## 2012-11-11 LAB — CBC
Platelets: 196 10*3/uL (ref 150–400)
RBC: 3.66 MIL/uL — ABNORMAL LOW (ref 3.87–5.11)
RDW: 15 % (ref 11.5–15.5)
WBC: 5.4 10*3/uL (ref 4.0–10.5)

## 2012-11-11 LAB — BASIC METABOLIC PANEL
Calcium: 9.2 mg/dL (ref 8.4–10.5)
Creatinine, Ser: 1.04 mg/dL (ref 0.50–1.10)
GFR calc Af Amer: 57 mL/min — ABNORMAL LOW (ref >90)
GFR calc non Af Amer: 49 mL/min — ABNORMAL LOW (ref >90)
Glucose, Bld: 105 mg/dL — ABNORMAL HIGH (ref 70–99)
Sodium: 142 mEq/L (ref 135–145)

## 2012-11-11 MED ORDER — TRIAMCINOLONE ACETONIDE 0.1 % EX CREA: TOPICAL_CREAM | Freq: Two times a day (BID) | CUTANEOUS | Status: DC

## 2012-11-11 MED ORDER — CEFUROXIME AXETIL 500 MG PO TABS
500.0000 mg | ORAL_TABLET | Freq: Two times a day (BID) | ORAL | Status: AC
Start: 2012-11-11 — End: 2012-11-16

## 2012-11-11 MED ORDER — FUROSEMIDE 40 MG PO TABS
40.0000 mg | ORAL_TABLET | Freq: Every day | ORAL | Status: DC
  Administered 2012-11-11: 10:00:00 40 mg via ORAL
  Filled 2012-11-11: qty 1

## 2012-11-11 MED ORDER — POTASSIUM CHLORIDE CRYS ER 20 MEQ PO TBCR: 20.0000 meq | EXTENDED_RELEASE_TABLET | Freq: Every day | ORAL | Status: DC

## 2012-11-11 MED ORDER — DICLOFENAC SODIUM 1 % TD GEL: 4.0000 g | Freq: Four times a day (QID) | TRANSDERMAL | Status: DC

## 2012-11-11 MED ORDER — LISINOPRIL 5 MG PO TABS: 5.0000 mg | ORAL_TABLET | Freq: Every day | ORAL | Status: DC

## 2012-11-11 MED ORDER — TRAMADOL HCL 50 MG PO TABS: 50.0000 mg | ORAL_TABLET | Freq: Three times a day (TID) | ORAL | Status: DC | PRN

## 2012-11-11 MED ORDER — FUROSEMIDE 40 MG PO TABS: 40.0000 mg | ORAL_TABLET | Freq: Every day | ORAL | Status: DC

## 2012-11-11 MED ORDER — CEFUROXIME AXETIL 500 MG PO TABS
500.0000 mg | ORAL_TABLET | Freq: Two times a day (BID) | ORAL | Status: DC
  Filled 2012-11-11 (×2): qty 1

## 2012-11-11 MED ORDER — ASPIRIN 81 MG PO TBEC: 81.0000 mg | DELAYED_RELEASE_TABLET | Freq: Every day | ORAL | Status: DC

## 2012-11-11 NOTE — Progress Notes (Signed)
Pt d/c to assisted living, dc instructions reviewed with daughter, she verbalized understanding, pt left via wheelchair, pt stable

## 2012-11-11 NOTE — Discharge Summary (Signed)
Physician Discharge Summary  Andrea Dorsey UJW:[id number] DOB: [birth year] DOA: 11/08/2012  PCP: Bufford Spikes, DO  Admit date: 11/08/2012 Discharge date: 11/11/2012  Time spent: >30 minutes  Recommendations for Outpatient Follow-up:  1. BMET to follow electrolytes and kidney function 2. Reassess BP and adjust medications as needed   BNP    Component Value Date/Time   PROBNP 1051.0* 11/08/2012 1532   Filed Weights   11/09/12 0504 11/10/12 0557 11/11/12 0518  Weight: 102.059 kg (225 lb) 100.29 kg (221 lb 1.6 oz) 100.3 kg (221 lb 1.9 oz)     Discharge Diagnoses:  Active Problems:   Hypothyroidism   Benign essential hypertension   Dementia in Parkinson's disease   Acute diastolic heart failure UTI Skin fungal infection  Discharge Condition: stable and improved. Will discharge back to ALF with HHPT  Diet recommendation: heart healthy low sodium diet.  History of present illness:  79 y.o. female with PMH significant for Hypertension, hypothyroidism, history of LE necrotizing fascitis S/P debridement and graft April 2014, Parkinson diseases, who presents complaining of worsening SOB for one 1 week, increase lower extremities edema, dyspnea on exertion, orthopnea. She denies chest pain, cough, fever. Patient use walker for ambulation, but has not been able to walk due to pain in her legs and dyspnea.  Patient has history of LE edema on and off for years. Swelling of lower extremities started again recently.  Patient also with increase urinary frequency, dysuria for last couples of days.  Per daughter patient has history of visual hallucinations and parkinson diseases.    Hospital Course:  Acute diastolic heart failure:  -patient back to estimated dry weight 208-210 lb  -discharge on lasix 40mg  daily -echo with preserved EF, no wall motion abnormalities and demonstrating grade 2 diastolic heart failure  -cont metoprolol and now lisinopril.  -troponin neg X 3   UTI:  -will  continue Rocephin for now, while inpatient; will change to ceftin BID and complete 5-7 days of treatment total for non-complicated UTI  -culture was never sent out before starting abx's -patient afebrile and WBC's WNL  Hypothyroidism  -cont synthroid.  -TSH WNL   Benign essential hypertension  -better control with new regimen  -advise to follow low sodoium diet  -further adjustment in 10 days during follow up with PCP -now on amlodipine, metoprolol, lisinopril and lasix.   Dementia in Parkinson's disease  -cont home meds ; stable.  Deconditioning  -HHPT at ALF   Skin fungal infection  -will treat with triamcinolone BID -patient advise to keep area clean, dry and to avoid scratching   Procedures:  2- D echo: hypertrophy, with an appearance suggesting concentric remodeling (increased wall thickness with normal wall mass), consistent with hypertensive changes. Systolic function was normal. Features are consistent with a pseudonormal left ventricular filling pattern, with concomitant abnormal relaxation and increased filling pressure (grade 2 diastolic dysfunction).   Consultations:  PT  Discharge Exam: Filed Vitals:   11/11/12 1027  BP: 145/51  Pulse:   Temp:   Resp:    General: Alert, awake, oriented x3, in no acute distress.  HEENT: No bruits, no goiter. No JVD  Heart: Regular rate and rhythm, without murmurs, rubs, gallops.  Lungs: Good air movement, clear to auscultion  Abdomen: Soft, nontender, nondistended, positive bowel sounds.  Neuro: Grossly intact, nonfocal.   Discharge Instructions  Discharge Orders   Future Appointments Provider Department Dept Phone   11/15/2012 11:30 AM Kermit Balo, [facility] [phone]   Future Orders  Complete By Expires   Diet - low sodium heart healthy  As directed    Discharge instructions  As directed    Comments:     Take medications as prescribed Follow up with PCP in 10 days Follow a low sodium  diet (2-2.5 grams in 24 hours) Daily weight for 5 days (communicate with PCP if 3 pounds weight gain overnight or more than 5 pounds in 1 week)       Medication List         acetaminophen 500 MG tablet  Commonly known as:  TYLENOL  Take 500 mg by mouth every 6 (six) hours as needed for pain.     amLODipine 5 MG tablet  Commonly known as:  NORVASC  Take 5 mg by mouth daily.     aspirin 81 MG EC tablet  Take 1 tablet (81 mg total) by mouth daily.     carbidopa-levodopa-entacapone 37.5-150-200 MG per tablet  Commonly known as:  STALEVO  Take 1 tablet by mouth 4 (four) times daily.     cefUROXime 500 MG tablet  Commonly known as:  CEFTIN  Take 1 tablet (500 mg total) by mouth 2 (two) times daily with a meal.     furosemide 40 MG tablet  Commonly known as:  LASIX  Take 1 tablet (40 mg total) by mouth daily.     levothyroxine 25 MCG tablet  Commonly known as:  SYNTHROID, LEVOTHROID  Take 25 mcg by mouth daily before breakfast.     lisinopril 5 MG tablet  Commonly known as:  PRINIVIL,ZESTRIL  Take 1 tablet (5 mg total) by mouth daily.     metoprolol succinate 25 MG 24 hr tablet  Commonly known as:  TOPROL-XL  Take 25 mg by mouth daily.     nystatin 100000 UNIT/GM Powd  Apply topically as needed.     potassium chloride SA 20 MEQ tablet  Commonly known as:  K-DUR,KLOR-CON  Take 1 tablet (20 mEq total) by mouth daily.     triamcinolone cream 0.1 %  Commonly known as:  KENALOG  Apply topically 2 (two) times daily. Apply to elbow cubital crest BID       Allergies  Allergen Reactions  . Ivp Dye [Iodinated Diagnostic Agents]     Only when intravenous, not on external skin.       Follow-up Information   Follow up with REED, TIFFANY, DO. Schedule an appointment as soon as possible for a visit in 10 days.   Specialty:  Geriatric Medicine   Contact information:   [address] [place] [address] [phone]        The results of significant diagnostics from  this hospitalization (including imaging, microbiology, ancillary and laboratory) are listed below for reference.    Significant Diagnostic Studies: Dg Chest 2 View  11/08/2012   CLINICAL DATA:  Shortness of breath.  EXAM: CHEST  2 VIEW  COMPARISON:  None.  FINDINGS: The cardio pericardial silhouette is enlarged. Vascular congestion noted with associated interstitial pulmonary edema. Tiny bilateral pleural effusions. Imaged bony structures of the thorax are intact.  IMPRESSION: Cardiomegaly with interstitial pulmonary edema and tiny bilateral pleural effusions.   Electronically Signed   By: Kennith Center M.D.   On: 11/08/2012 16:21   Labs: Basic Metabolic Panel:  Recent Labs Lab 11/08/12 1532 11/08/12 1940 11/09/12 0530 11/10/12 0510 11/11/12 0403  NA 139  --  142 142 142  K 4.4  --  3.9 3.6 3.7  CL 105  --  107 106 109  CO2 21  --  24 26 24   GLUCOSE 102*  --  108* 101* 105*  BUN 16  --  15 16 16   CREATININE 0.97 1.05 0.98 1.06 1.04  CALCIUM 10.5  --  9.4 9.5 9.2   CBC:  Recent Labs Lab 11/08/12 1532 11/08/12 1940 11/11/12 0403  WBC 7.4 5.4 5.4  HGB 12.7 12.0 11.1*  HCT 38.4 35.4* 33.2*  MCV 90.6 90.3 90.7  PLT 225 205 196   Cardiac Enzymes:  Recent Labs Lab 11/08/12 1827 11/09/12 0115 11/09/12 0530  TROPONINI <0.30 <0.30 <0.30   BNP: BNP (last 3 results)  Recent Labs  11/08/12 1532  PROBNP 1051.0*    Signed:  Shelitha Magley  [facility] 11/11/2012, 11:42 AM

## 2012-11-11 NOTE — Progress Notes (Signed)
Patient complained of pain 10/10 to lower abdomen.  Tylenol given x1.  RN will continue to monitor. Louretta Parma, RN

## 2012-11-11 NOTE — Progress Notes (Signed)
Received fax from [facility] requesting scripts for voltaren gel and tramadol for her knee pain.  Orders and prescriptions faxed.

## 2012-11-11 NOTE — Progress Notes (Signed)
Utilization Review Completed Davonne Jarnigan J. Eluterio Seymour, RN, BSN, NCM [phone]  

## 2012-11-12 DIAGNOSIS — I5033 Acute on chronic diastolic (congestive) heart failure: Secondary | ICD-10-CM

## 2012-11-12 DIAGNOSIS — G20 Parkinson's disease: Secondary | ICD-10-CM

## 2012-11-12 DIAGNOSIS — M4800 Spinal stenosis, site unspecified: Secondary | ICD-10-CM

## 2012-11-12 DIAGNOSIS — G20A1 Parkinson's disease without dyskinesia, without mention of fluctuations: Secondary | ICD-10-CM

## 2012-11-12 DIAGNOSIS — R269 Unspecified abnormalities of gait and mobility: Secondary | ICD-10-CM

## 2012-11-12 NOTE — Clinical Social Work Psychosocial (Addendum)
    Clinical Social Work Department BRIEF PSYCHOSOCIAL ASSESSMENT 11/12/2012  Patient:  [facility]     Account Number:  [id number]     Admit date:  11/08/2012  Clinical Social Worker:  Tiburcio Pea  Date/Time:  11/09/2012 10:20 AM  Referred by:  Physician  Date Referred:  11/09/2012 Referred for  Other - See comment   Other Referral:   Return to Assisted Living   Interview type:  Patient Other interview type:    PSYCHOSOCIAL DATA Living Status:  FACILITY Admitted from facility:  [facility] Level of care:  Assisted Living Primary support name:  [name]  [phone] Primary support relationship to patient:  CHILD, ADULT Degree of support available:   Strong support    CURRENT CONCERNS Current Concerns  Post-Acute Placement   Other Concerns:   Return to ALF is anticipated    SOCIAL WORK ASSESSMENT / PLAN CSW met with this 79 year old female to discuss d/c needs. She currently resides at [facility] and states that she really enjoys living there. She wants to return there.  CSW spoke to patient about possible need for SNF short term if physical therapy and MD recommended; she stated that she would agree but would prefer to return to ALF.  Fl2 placed on chart for MD's signauture.  CSW spoke to Admissions at [facility]- she stated that the plan to accept patient back and will review FL2 when ready for d/c.   Assessment/plan status:  Psychosocial Support/Ongoing Assessment of Needs Other assessment/ plan:   Information/referral to community resources:   None at this time.    PATIENT'S/FAMILY'S RESPONSE TO PLAN OF CARE: Patient is alert and very pleasant. She is hoping to return to the Assisted Living facility. Patient requests that her daughter be kept notified of d/c plan and states that her daughter is very supportive. Patient was appreciative of CSW's assistance.  Lorri Frederick. Briarrose Shor, LCSWA  [phone]

## 2012-11-12 NOTE — Progress Notes (Signed)
Ok per MD for patient to d/c today back to [facility]. CSW completed Fl2 and sent with d/c summary to facility for review. Ok per [place] for patient to return back to the facility today.  Patient's daughter [name] was notified and she will transport patient via car. Nursing notified. Patient is pleased with return to facility. No further CSW needs identified. CSW signing off.  Lorri Frederick. [place]  [phone]

## 2012-11-15 ENCOUNTER — Ambulatory Visit (INDEPENDENT_AMBULATORY_CARE_PROVIDER_SITE_OTHER): Payer: Medicare Other | Admitting: Internal Medicine

## 2012-11-15 ENCOUNTER — Encounter: Payer: Self-pay | Admitting: Internal Medicine

## 2012-11-15 VITALS — BP 126/68 | HR 65 | Temp 98.8°F | Wt 225.0 lb

## 2012-11-15 DIAGNOSIS — G20 Parkinson's disease: Secondary | ICD-10-CM

## 2012-11-15 DIAGNOSIS — G20A1 Parkinson's disease without dyskinesia, without mention of fluctuations: Secondary | ICD-10-CM

## 2012-11-15 DIAGNOSIS — I10 Essential (primary) hypertension: Secondary | ICD-10-CM

## 2012-11-15 DIAGNOSIS — M159 Polyosteoarthritis, unspecified: Secondary | ICD-10-CM

## 2012-11-15 DIAGNOSIS — F0280 Dementia in other diseases classified elsewhere without behavioral disturbance: Secondary | ICD-10-CM

## 2012-11-15 DIAGNOSIS — M4800 Spinal stenosis, site unspecified: Secondary | ICD-10-CM

## 2012-11-15 DIAGNOSIS — R0989 Other specified symptoms and signs involving the circulatory and respiratory systems: Secondary | ICD-10-CM

## 2012-11-15 DIAGNOSIS — E039 Hypothyroidism, unspecified: Secondary | ICD-10-CM

## 2012-11-15 MED ORDER — LISINOPRIL 5 MG PO TABS: 10.0000 mg | ORAL_TABLET | Freq: Every day | ORAL | Status: DC

## 2012-11-15 NOTE — Progress Notes (Signed)
Patient ID: Andrea Dorsey, female   DOB: 03/18/1929, 79 y.o.   MRN: [id number] Location:  [facility]  Code Status: full code   Allergies  Allergen Reactions  . Ivp Dye [Iodinated Diagnostic Agents]     Only when intravenous, not on external skin.  . Clindamycin Rash    Unclear whether patient has an actual allergy to clindamycin. On beta-lactam at the same time.    Chief Complaint  Patient presents with  . Medical Managment of Chronic Issues    6 week f/u, labs printed.  [facility] Hospitalization Follow-up    Released last Thursday from the hospital for CHF and UTI   . Pharmacy Change    patient is currently at [facility] and uses [facility]   . Medication Management    ? if ok to take 2 (500 mg) Tylenol vs 1    HPI: Patient is a 79 y.o. black female seen in the office today for f/u from hospitalization.  Urgent care and ED visit with admission.  Was getting dyspnea on exertion.  Also had symptoms of UTI.  Had rattling sound like wheezing in chest.  Wanted to admit her to check echo.  Did have cardiomegaly and some fluid at the bases.  Got IV lasix for a few days and changed to po lasix.  Completed abx for UTI.  Wheezing cleared up after a couple of days.  [facility] nurse still felt she had edema.  Monitoring her weight with parameters.  No difficulty with shortness of breath any longer.    At [facility] since 10/18/12.    Is on 4 different bp meds at low dose and her daughter wonders if all are needed.  Amlodipine was originally her only bp med.  Now on toprol-xl, lisinopril, lasix and amlodipine.    Seroquel was prescribed by neurologist to help with sleep.  [facility] nurse discouraged it saying it would cause confusion and hallucinations.  I explained that it actually treats these things.      At night, she moans and does not sleep through the night.    Review of Systems:  Review of Systems  Constitutional: Negative for fever and  chills.  HENT: Negative for congestion.   Eyes: Negative for blurred vision.  Respiratory: Negative for shortness of breath.   Cardiovascular: Negative for chest pain and palpitations.  Gastrointestinal: Negative for constipation.  Genitourinary: Negative for dysuria.  Musculoskeletal: Positive for back pain and joint pain. Negative for falls.  Skin: Positive for itching. Negative for rash.  Neurological: Positive for tremors.  Psychiatric/Behavioral: Positive for depression and memory loss.    Past Medical History  Diagnosis Date  . Benign essential hypertension   . Hypothyroidism   . Osteoarthritis, generalized   . Parkinson disease   . Spinal stenosis   . History of necrotizing fascIItis     left leg, s/p debridement and graft  . Dementia in Parkinson's disease   . Depression     Past Surgical History  Procedure Laterality Date  . Skin debridement  2014    Brambhelt, MD  . Skin graft  2014    Brambhelt MD  . Spine surgery  2006    spinal stenosis  . Abdominal hysterectomy  1977    Social History:   reports that she has never smoked. She does not have any smokeless tobacco history on file. She reports that she does not drink alcohol or use illicit drugs.  Family History  Problem Relation Age of Onset  . Heart disease Mother   . Heart disease Sister   . Hypertension Sister   . Stroke Sister   . Heart disease Sister     heart attack  . Cancer Sister     colon    Medications: Patient's Medications  New Prescriptions   No medications on file  Previous Medications   ACETAMINOPHEN (TYLENOL) 500 MG TABLET    Take 500 mg by mouth every 6 (six) hours as needed for pain.   AMLODIPINE (NORVASC) 5 MG TABLET    Take 5 mg by mouth daily.   ASPIRIN 81 MG EC TABLET    Take 1 tablet (81 mg total) by mouth daily.   CARBIDOPA-LEVODOPA-ENTACAPONE (STALEVO) 37.5-150-200 MG PER TABLET    Take 1 tablet by mouth 4 (four) times daily.   CEFUROXIME (CEFTIN) 500 MG TABLET    Take  1 tablet (500 mg total) by mouth 2 (two) times daily with a meal.   CHOLECALCIFEROL (VITAMIN D3) 2000 UNITS TABS    Take by mouth daily.   DICLOFENAC SODIUM (VOLTAREN) 1 % GEL    Apply 4 g topically 4 (four) times daily.   FUROSEMIDE (LASIX) 40 MG TABLET    Take 1 tablet (40 mg total) by mouth daily.   LEVOTHYROXINE (SYNTHROID, LEVOTHROID) 25 MCG TABLET    Take 25 mcg by mouth daily before breakfast.   LISINOPRIL (PRINIVIL,ZESTRIL) 5 MG TABLET    Take 1 tablet (5 mg total) by mouth daily.   METOPROLOL SUCCINATE (TOPROL-XL) 25 MG 24 HR TABLET    Take 25 mg by mouth daily.    NYSTATIN (MYCOSTATIN/NYSTOP) 100000 UNIT/GM POWD    Apply topically as needed.   POTASSIUM CHLORIDE SA (K-DUR,KLOR-CON) 20 MEQ TABLET    Take 1 tablet (20 mEq total) by mouth daily.   TRAMADOL (ULTRAM) 50 MG TABLET    Take 1 tablet (50 mg total) by mouth every 8 (eight) hours as needed for pain.   TRIAMCINOLONE CREAM (KENALOG) 0.1 %    Apply topically 2 (two) times daily. Apply to elbow cubital crest BID  Modified Medications   No medications on file  Discontinued Medications   No medications on file   Physical Exam: Filed Vitals:   11/15/12 1156  BP: 126/68  Pulse: 65  Temp: 98.8 F (37.1 C)  TempSrc: Oral  Weight: 225 lb (102.059 kg)  SpO2: 94%  Physical Exam  Constitutional: She appears well-developed and well-nourished. No distress.  Overweight black female  HENT:  Head: Normocephalic and atraumatic.  Neck: No JVD present.  Cardiovascular: Intact distal pulses.   Irregularly irregular on exam, but NSR on EKG  Pulmonary/Chest: Effort normal and breath sounds normal. No respiratory distress. She has no rales.  Musculoskeletal: Normal range of motion. She exhibits edema.  Neurological: She is alert.  drowsy  Skin: Skin is warm and dry.  Psychiatric:  Flat affect    Labs reviewed: Basic Metabolic Panel:  Recent Labs  16/10/96 1032  11/08/12 1940 11/09/12 0530 11/10/12 0510 11/11/12 0403  NA 142   < >  --  142 142 142  K 4.2  < >  --  3.9 3.6 3.7  CL 104  < >  --  107 106 109  CO2 22  < >  --  24 26 24   GLUCOSE 105*  < >  --  108* 101* 105*  BUN 12  < >  --  15 16 16   CREATININE 1.12*  < >  1.05 0.98 1.06 1.04  CALCIUM 9.9  < >  --  9.4 9.5 9.2  TSH 3.700  --  4.929*  --   --   --   < > = values in this interval not displayed. Liver Function Tests:  Recent Labs  10/07/12 1032  AST 15  ALT 7  ALKPHOS 91  BILITOT 0.9  PROT 6.2  CBC:  Recent Labs  10/07/12 1032 11/08/12 1532 11/08/12 1940 11/11/12 0403  WBC 6.1 7.4 5.4 5.4  NEUTROABS 3.9  --   --   --   HGB 13.1 12.7 12.0 11.1*  HCT 39.1 38.4 35.4* 33.2*  MCV 93 90.6 90.3 90.7  PLT  --  225 205 196   Lipid Panel:  Recent Labs  10/07/12 1032  HDL 59  LDLCALC 133*  TRIG 74  CHOLHDL 3.5   Lab Results  Component Value Date   HGBA1C 5.6 10/07/2012   Assessment/Plan 1. Parkinson disease -is now following with Dr. Frances Furbish at [facility] -stable at present  2. Dementia in Parkinson's disease -will benefit from seroquel as prescribed by Dr. Sherian Rein written for [facility] and explained benefit to daughter and that the nurse was misinformed who told her it would make her more confused and hallucinate (it will treat this in her case)  3. Spinal stenosis -stable--may use tylenol es 2 tabs po tid  4. Benign essential hypertension -d/c amlodipine -increase lisinopril to 10mg  daily -continue toprol  5. Hypothyroidism -TSH normal in sept  6. Osteoarthritis, generalized -again, may increase tylenol to 3g per day total  7.  Irregular heart rhythm -EKG turned out to be normal sinus rhythm so must have been PACs as she had during hospitalization  Labs/tests ordered:  EKG done today for irregular rhythm Next appt:  6 wks

## 2012-11-15 NOTE — Patient Instructions (Signed)
[facility]

## 2012-11-18 ENCOUNTER — Other Ambulatory Visit: Payer: Self-pay | Admitting: *Deleted

## 2012-11-18 ENCOUNTER — Telehealth: Payer: Self-pay

## 2012-11-18 NOTE — Telephone Encounter (Signed)
Message left on Triage VM: Call was placed on Monday with no return call. Please call to give verbal orders on Teaching Education on UTI/CHF, and to show patient how to use breathing device to exercise lungs.  Per protocol from Dr.Reed ok to give verbal orders on all active patients.  I called Debbie and gave verbal.

## 2012-12-10 ENCOUNTER — Telehealth: Payer: Self-pay

## 2012-12-10 NOTE — Telephone Encounter (Signed)
Message was reviewed Man Nedra Hai, NP. Ok'd for message to be reviewed by PCP as a FYI. No additional instructions given at this time

## 2012-12-10 NOTE — Telephone Encounter (Signed)
Message left on triage VM from nurse Harvie Heck with Andrea Dorsey), patient's B/P was 178/88 (patient was upset about PT being discontinued, patient also upset with weight)  weight was 221 (previously 214, 2 days ago). Harvie Heck is questioning the accuracy of the scale. Please advise of any recommendations   I called Harvie Heck patient was asymptomatic with B/P, Harvie Heck thinks it was due to patient being upset because her B/P is usually within normal range.

## 2012-12-12 NOTE — Telephone Encounter (Signed)
Please find out from [place] home health nurse what pt's weights were over the weekend since she'd had a weight gain (? Real vs inaccurate scale or weight at a different time that prior day).  Has she had increased edema or shortness of breath?

## 2012-12-13 ENCOUNTER — Other Ambulatory Visit: Payer: Self-pay | Admitting: Internal Medicine

## 2012-12-13 DIAGNOSIS — I10 Essential (primary) hypertension: Secondary | ICD-10-CM

## 2012-12-13 MED ORDER — LISINOPRIL 20 MG PO TABS: 20.0000 mg | ORAL_TABLET | Freq: Every day | ORAL | Status: DC

## 2012-12-13 NOTE — Telephone Encounter (Signed)
Harvie Heck from [facility] left message about Andrea Dorsey weight down 219.8, BP 176/82 today, this morning 160/90 at [facility]

## 2012-12-13 NOTE — Telephone Encounter (Signed)
Noted.  Will increase lisinopril to 20mg  daily.

## 2012-12-14 MED ORDER — AMBULATORY NON FORMULARY MEDICATION: Status: DC

## 2012-12-14 NOTE — Telephone Encounter (Signed)
Left message on VM for Randy @ [phone] (to inform him of Dr.Reeds response, called home number (which is patient's daughter's number) and informed daughter of change. I then called [facility] and was informed to send an order that indicated to D/C 10 mg tab of Lisinopril and increase to 20 mg. Order was faxed to [facility] [phone]  RX was printed and faxed yesterday

## 2012-12-20 ENCOUNTER — Ambulatory Visit (INDEPENDENT_AMBULATORY_CARE_PROVIDER_SITE_OTHER): Payer: Medicare Other | Admitting: Internal Medicine

## 2012-12-20 ENCOUNTER — Encounter: Payer: Self-pay | Admitting: Internal Medicine

## 2012-12-20 VITALS — BP 128/72 | HR 87 | Temp 98.2°F | Wt 211.4 lb

## 2012-12-20 DIAGNOSIS — G20 Parkinson's disease: Secondary | ICD-10-CM

## 2012-12-20 DIAGNOSIS — R0982 Postnasal drip: Secondary | ICD-10-CM | POA: Insufficient documentation

## 2012-12-20 DIAGNOSIS — E785 Hyperlipidemia, unspecified: Secondary | ICD-10-CM

## 2012-12-20 DIAGNOSIS — F458 Other somatoform disorders: Secondary | ICD-10-CM

## 2012-12-20 DIAGNOSIS — I509 Heart failure, unspecified: Secondary | ICD-10-CM

## 2012-12-20 DIAGNOSIS — I5032 Chronic diastolic (congestive) heart failure: Secondary | ICD-10-CM

## 2012-12-20 DIAGNOSIS — K5900 Constipation, unspecified: Secondary | ICD-10-CM

## 2012-12-20 DIAGNOSIS — K5909 Other constipation: Secondary | ICD-10-CM

## 2012-12-20 DIAGNOSIS — F329 Major depressive disorder, single episode, unspecified: Secondary | ICD-10-CM

## 2012-12-20 DIAGNOSIS — M159 Polyosteoarthritis, unspecified: Secondary | ICD-10-CM

## 2012-12-20 DIAGNOSIS — L298 Other pruritus: Secondary | ICD-10-CM

## 2012-12-20 DIAGNOSIS — H6121 Impacted cerumen, right ear: Secondary | ICD-10-CM

## 2012-12-20 DIAGNOSIS — F32A Depression, unspecified: Secondary | ICD-10-CM

## 2012-12-20 DIAGNOSIS — H6120 Impacted cerumen, unspecified ear: Secondary | ICD-10-CM

## 2012-12-20 DIAGNOSIS — G20A1 Parkinson's disease without dyskinesia, without mention of fluctuations: Secondary | ICD-10-CM

## 2012-12-20 DIAGNOSIS — F3289 Other specified depressive episodes: Secondary | ICD-10-CM

## 2012-12-20 NOTE — Progress Notes (Signed)
Patient ID: Andrea Dorsey, female   DOB: 1929/03/17, 79 y.o.   MRN: [id number]   Location:  [facility]  Code Status: DNR   Allergies  Allergen Reactions  . Ivp Dye [Iodinated Diagnostic Agents]     Only when intravenous, not on external skin.  . Clindamycin Rash    Unclear whether patient has an actual allergy to clindamycin. On beta-lactam at the same time.    Chief Complaint  Patient presents with  . Medical Managment of Chronic Issues    6 week f/u   . Immunizations    will check records for Tdap  . other    on-going pains in the neck, shoulders & last Dexa has been several yrs ago.    HPI: Patient is a 79 y.o. female seen in the office today for medical mgt of chronic conditions.    Pt cannot get dexa due to inability to lay flat on xray table and move herself onto the table.    Weight is down 12-13 lbs.   Did well over the holiday.   Has to urinate frequently.  Feels the lasix drains her and makes her want to go back to bed.   Really wants her lasix reduced.  Is on potassium supplement also from hospital.  Taking the seroquel.  Says she still doesn't sleep at night-sleeps 10pm till 2-3am, then wide awake up in chair.  Itching is better.    Doesn't like to be around too many people.  Doesn't like to wait for someone to come put her voltaren on her knees.    She complains of postnasal drip and says that causes her to cough and stay awake.    Was discharged from [facility].  Trying to get [facility] therapy to work with her.    Review of Systems:  Review of Systems  Constitutional: Positive for weight loss and malaise/fatigue. Negative for fever, chills and diaphoresis.  HENT: Positive for ear pain and hearing loss.        Right ear also itchy;  Postnasal drip bothersome at night  Respiratory: Negative for shortness of breath.   Cardiovascular: Negative for chest pain and leg swelling.  Gastrointestinal: Negative for  heartburn.  Genitourinary: Positive for frequency. Negative for dysuria.  Musculoskeletal: Positive for joint pain and myalgias. Negative for falls.  Skin: Negative for rash.  Neurological: Positive for weakness. Negative for loss of consciousness.  Psychiatric/Behavioral: Positive for depression and memory loss.    Past Medical History  Diagnosis Date  . Benign essential hypertension   . Hypothyroidism   . Osteoarthritis, generalized   . Parkinson disease   . Spinal stenosis   . History of necrotizing fascIItis     left leg, s/p debridement and graft  . Dementia in Parkinson's disease   . Depression     Past Surgical History  Procedure Laterality Date  . Skin debridement  2014    Brambhelt, MD  . Skin graft  2014    Brambhelt MD  . Spine surgery  2006    spinal stenosis  . Abdominal hysterectomy  1977    Social History:   reports that she has never smoked. She does not have any smokeless tobacco history on file. She reports that she does not drink alcohol or use illicit drugs.  Family History  Problem Relation Age of Onset  . Heart disease Mother   . Heart disease Sister   . Hypertension Sister   .  Stroke Sister   . Heart disease Sister     heart attack  . Cancer Sister     colon    Medications: Patient's Medications  New Prescriptions   No medications on file  Previous Medications   ACETAMINOPHEN (TYLENOL) 500 MG TABLET    Take 500 mg by mouth every 6 (six) hours as needed for pain.   ASPIRIN 81 MG EC TABLET    Take 1 tablet (81 mg total) by mouth daily.   CARBIDOPA-LEVODOPA-ENTACAPONE (STALEVO) 37.5-150-200 MG PER TABLET    Take 1 tablet by mouth 4 (four) times daily.   CHOLECALCIFEROL (VITAMIN D3) 2000 UNITS TABS    Take by mouth daily.   DICLOFENAC SODIUM (VOLTAREN) 1 % GEL    Apply 4 g topically 4 (four) times daily.   FUROSEMIDE (LASIX) 40 MG TABLET    Take 1 tablet (40 mg total) by mouth daily.   LEVOTHYROXINE (SYNTHROID, LEVOTHROID) 25 MCG TABLET     Take 25 mcg by mouth daily before breakfast.   LISINOPRIL (PRINIVIL,ZESTRIL) 20 MG TABLET    Take 1 tablet (20 mg total) by mouth daily.   METOPROLOL SUCCINATE (TOPROL-XL) 25 MG 24 HR TABLET    Take 25 mg by mouth daily.    NYSTATIN (MYCOSTATIN/NYSTOP) 100000 UNIT/GM POWD    Apply topically as needed.   POTASSIUM CHLORIDE SA (K-DUR,KLOR-CON) 20 MEQ TABLET    Take 1 tablet (20 mEq total) by mouth daily.   TRAMADOL (ULTRAM) 50 MG TABLET    Take 1 tablet (50 mg total) by mouth every 8 (eight) hours as needed for pain.  Modified Medications   No medications on file  Discontinued Medications   AMBULATORY NON FORMULARY MEDICATION    Medication Name: Discontinue Lisinopril 10 mg tab and start increased dosage of Lisinopril 20 mg (Sent to [facility] [address])   TRIAMCINOLONE CREAM (KENALOG) 0.1 %    Apply topically 2 (two) times daily. Apply to elbow cubital crest BID     Physical Exam: Filed Vitals:   12/20/12 1337  BP: 128/72  Pulse: 87  Temp: 98.2 F (36.8 C)  TempSrc: Oral  Weight: 211 lb 6.4 oz (95.89 kg)  SpO2: 97%  Physical Exam  Constitutional: She appears well-developed and well-nourished. No distress.  HENT:  Cerumen impaction right  Cardiovascular: Normal rate, regular rhythm, normal heart sounds and intact distal pulses.   Pulmonary/Chest: Effort normal and breath sounds normal. No respiratory distress.  Abdominal: Soft. Bowel sounds are normal. She exhibits no distension. There is no tenderness.  Musculoskeletal: Normal range of motion. She exhibits no edema and no tenderness.  Neurological: She is alert.  Drowsy today  Skin: Skin is warm and dry.    Labs reviewed: Basic Metabolic Panel:  Recent Labs  84/13/24 1032  11/08/12 1940 11/09/12 0530 11/10/12 0510 11/11/12 0403  NA 142  < >  --  142 142 142  K 4.2  < >  --  3.9 3.6 3.7  CL 104  < >  --  107 106 109  CO2 22  < >  --  24 26 24   GLUCOSE 105*  < >  --  108* 101* 105*  BUN 12  < >  --  15 16 16     CREATININE 1.12*  < > 1.05 0.98 1.06 1.04  CALCIUM 9.9  < >  --  9.4 9.5 9.2  TSH 3.700  --  4.929*  --   --   --   < > =  values in this interval not displayed. Liver Function Tests:  Recent Labs  10/07/12 1032  AST 15  ALT 7  ALKPHOS 91  BILITOT 0.9  PROT 6.2   No results found for this basename: LIPASE, AMYLASE,  in the last 8760 hours No results found for this basename: AMMONIA,  in the last 8760 hours CBC:  Recent Labs  10/07/12 1032 11/08/12 1532 11/08/12 1940 11/11/12 0403  WBC 6.1 7.4 5.4 5.4  NEUTROABS 3.9  --   --   --   HGB 13.1 12.7 12.0 11.1*  HCT 39.1 38.4 35.4* 33.2*  MCV 93 90.6 90.3 90.7  PLT  --  225 205 196   Lipid Panel:  Recent Labs  10/07/12 1032  HDL 59  LDLCALC 133*  TRIG 74  CHOLHDL 3.5   Lab Results  Component Value Date   HGBA1C 5.6 10/07/2012    Assessment/Plan 1. Postnasal drip -given robitussin for cough at bedtime -avoid anticholinergics/antihistamines  2. Parkinson disease -cont medications per neurology -notes her tremors are worse when she is fatigued  3. Depression -stable, still does not sleep well -having a bad day today--exhausted after lots of family events over Thanksgiving holiday  4. Osteoarthritis, generalized -notes she is not getting her voltaren gel 4x daily as ordered--daughter is going to talk with nurses about this at [facility]  5. Chronic diastolic CHF (congestive heart failure) -will try to reduce lasix from 40mg  to 20 mg, but explained to pt and her daughter that it is crucial to continue fluid restriction, daily weights and the facility is to let me know of weight gain of 3# in 1 day or 5# in 1 wk -cont potassium - check Basic metabolic panel due to pt's increased fatigue that she is attributing to urinary frequency from lasix and possible dehydration  6. Neurogenic pruritus -has improved with seroquel at hs  7. Hyperlipidemia LDL goal < 100 -not currently on statin therapy--pt and her  daughter prefer to try to watch her diet a little bit more rather than taking meds so we will try this   8. Cerumen impaction, right - Ear wax removal was performed with benefit (was having itching and irritation of right ear before removal)  9. Chronic constipation -add senna s 2 tabs daily prn constipation  Labs/tests ordered:  Bmp today Next appt:  3 mos

## 2012-12-21 LAB — BASIC METABOLIC PANEL
BUN/Creatinine Ratio: 12 (ref 11–26)
BUN: 15 mg/dL (ref 8–27)
CO2: 25 mmol/L (ref 18–29)
Calcium: 10.2 mg/dL (ref 8.6–10.2)
Chloride: 103 mmol/L (ref 97–108)
Creatinine, Ser: 1.23 mg/dL — ABNORMAL HIGH (ref 0.57–1.00)
GFR calc Af Amer: 48 mL/min/{1.73_m2} — ABNORMAL LOW (ref >59)
GFR calc non Af Amer: 41 mL/min/{1.73_m2} — ABNORMAL LOW (ref >59)
Glucose: 114 mg/dL — ABNORMAL HIGH (ref 65–99)
Potassium: 4.3 mmol/L (ref 3.5–5.2)
Sodium: 142 mmol/L (ref 134–144)

## 2013-01-07 ENCOUNTER — Encounter: Payer: Self-pay | Admitting: Internal Medicine

## 2013-01-07 ENCOUNTER — Ambulatory Visit (INDEPENDENT_AMBULATORY_CARE_PROVIDER_SITE_OTHER): Payer: Medicare Other | Admitting: Internal Medicine

## 2013-01-07 VITALS — BP 142/80 | HR 68 | Temp 99.3°F | Resp 12 | Wt 216.0 lb

## 2013-01-07 DIAGNOSIS — J0100 Acute maxillary sinusitis, unspecified: Secondary | ICD-10-CM

## 2013-01-07 DIAGNOSIS — I10 Essential (primary) hypertension: Secondary | ICD-10-CM

## 2013-01-07 DIAGNOSIS — G20A1 Parkinson's disease without dyskinesia, without mention of fluctuations: Secondary | ICD-10-CM

## 2013-01-07 DIAGNOSIS — G20 Parkinson's disease: Secondary | ICD-10-CM

## 2013-01-07 MED ORDER — AZITHROMYCIN 250 MG PO TABS: ORAL_TABLET | ORAL | Status: DC

## 2013-01-07 MED ORDER — METOPROLOL SUCCINATE ER 25 MG PO TB24: 50.0000 mg | ORAL_TABLET | Freq: Two times a day (BID) | ORAL | Status: DC

## 2013-01-07 NOTE — Progress Notes (Signed)
Patient ID: Andrea Dorsey, female   DOB: 12/01/1929, 79 y.o.   MRN: [id number]   Location:  [facility]  Code Status: DNR   Allergies  Allergen Reactions  . Ivp Dye [Iodinated Diagnostic Agents]     Only when intravenous, not on external skin.  . Clindamycin Rash    Unclear whether patient has an actual allergy to clindamycin. On beta-lactam at the same time.    Chief Complaint  Patient presents with  . URI    Cough and head congestion x 1 week    HPI: Patient is a 79 y.o. black female seen in the office today for acute visit due to coughing and being more congested Going on since Monday Has been getting robitussin Bringing up phlegm Persistent Everyone around is coughing Temp 99.3 today No chills Did have flu shot Feels bad in the head One side of throat has junk in it in middle of night Had diarrhea, but not since finished her abx a month ago Neck, shoulders and head achey  Review of Systems:  Review of Systems  Constitutional: Negative for chills.       Low grade temp for her  HENT: Positive for congestion and sore throat. Negative for ear pain.   Eyes: Positive for redness.  Respiratory: Positive for cough and sputum production. Negative for shortness of breath and wheezing.   Cardiovascular: Negative for chest pain, palpitations and leg swelling.  Gastrointestinal: Negative for heartburn, nausea, vomiting, abdominal pain and diarrhea.  Genitourinary: Negative for dysuria.  Musculoskeletal: Positive for myalgias.       Of neck and shoulders  Skin: Negative for rash.  Neurological: Positive for tingling, weakness and headaches.  Endo/Heme/Allergies: Does not bruise/bleed easily.  Psychiatric/Behavioral: Positive for depression and memory loss.     Past Medical History  Diagnosis Date  . Benign essential hypertension   . Hypothyroidism   . Osteoarthritis, generalized   . Parkinson disease   . Spinal stenosis   .  History of necrotizing fascIItis     left leg, s/p debridement and graft  . Dementia in Parkinson's disease   . Depression     Past Surgical History  Procedure Laterality Date  . Skin debridement  2014    Brambhelt, MD  . Skin graft  2014    Brambhelt MD  . Spine surgery  2006    spinal stenosis  . Abdominal hysterectomy  1977    Social History:   reports that she has never smoked. She does not have any smokeless tobacco history on file. She reports that she does not drink alcohol or use illicit drugs.  Family History  Problem Relation Age of Onset  . Heart disease Mother   . Heart disease Sister   . Hypertension Sister   . Stroke Sister   . Heart disease Sister     heart attack  . Cancer Sister     colon    Medications: Patient's Medications  New Prescriptions   No medications on file  Previous Medications   ACETAMINOPHEN (TYLENOL) 500 MG TABLET    Take 500 mg by mouth every 6 (six) hours as needed for pain.   ASPIRIN 81 MG EC TABLET    Take 1 tablet (81 mg total) by mouth daily.   CARBIDOPA-LEVODOPA-ENTACAPONE (STALEVO) 37.5-150-200 MG PER TABLET    Take 1 tablet by mouth 4 (four) times daily.   CHOLECALCIFEROL (VITAMIN D3) 2000 UNITS TABS    Take by  mouth daily.   DICLOFENAC SODIUM (VOLTAREN) 1 % GEL    Apply 4 g topically 4 (four) times daily.   FUROSEMIDE (LASIX) 20 MG TABLET    Take 20 mg by mouth daily.   LEVOTHYROXINE (SYNTHROID, LEVOTHROID) 25 MCG TABLET    Take 25 mcg by mouth daily before breakfast.   LISINOPRIL (PRINIVIL,ZESTRIL) 20 MG TABLET    Take 1 tablet (20 mg total) by mouth daily.   METOPROLOL SUCCINATE (TOPROL-XL) 25 MG 24 HR TABLET    Take 25 mg by mouth 2 (two) times daily.    NYSTATIN (MYCOSTATIN/NYSTOP) 100000 UNIT/GM POWD    Apply topically as needed.   POTASSIUM CHLORIDE SA (K-DUR,KLOR-CON) 20 MEQ TABLET    Take 1 tablet (20 mEq total) by mouth daily.   TRAMADOL (ULTRAM) 50 MG TABLET    Take 1 tablet (50 mg total) by mouth every 8 (eight)  hours as needed for pain.  Modified Medications   No medications on file  Discontinued Medications   FUROSEMIDE (LASIX) 40 MG TABLET    Take 1 tablet (40 mg total) by mouth daily.     Physical Exam: Filed Vitals:   01/07/13 1018  BP: 142/80  Pulse: 68  Temp: 99.3 F (37.4 C)  TempSrc: Oral  Resp: 12  Weight: 216 lb (97.977 kg)  SpO2: 97%  Physical Exam  Constitutional: No distress.  HENT:  Head: Normocephalic and atraumatic.  Mouth/Throat: Oropharynx is clear and moist.  Tender over left maxillary sinus; small glob of cerumen still in left ear canal  Eyes: EOM are normal. Pupils are equal, round, and reactive to light.  Erythematous conjunctiva  Neck: Neck supple.  Left neck tenderness  Cardiovascular: Intact distal pulses.   irreg irreg  Pulmonary/Chest: Effort normal and breath sounds normal. No respiratory distress. She has no rales.  Moist cough  Lymphadenopathy:    She has no cervical adenopathy.  Neurological: She is alert.  More conversive today  Skin: Skin is warm and dry.     Labs reviewed: Basic Metabolic Panel:  Recent Labs  95/62/13 1032  11/08/12 1940  11/10/12 0510 11/11/12 0403 12/20/12 1429  NA 142  < >  --   < > 142 142 142  K 4.2  < >  --   < > 3.6 3.7 4.3  CL 104  < >  --   < > 106 109 103  CO2 22  < >  --   < > 26 24 25   GLUCOSE 105*  < >  --   < > 101* 105* 114*  BUN 12  < >  --   < > 16 16 15   CREATININE 1.12*  < > 1.05  < > 1.06 1.04 1.23*  CALCIUM 9.9  < >  --   < > 9.5 9.2 10.2  TSH 3.700  --  4.929*  --   --   --   --   < > = values in this interval not displayed. Liver Function Tests:  Recent Labs  10/07/12 1032  AST 15  ALT 7  ALKPHOS 91  BILITOT 0.9  PROT 6.2  CBC:  Recent Labs  10/07/12 1032 11/08/12 1532 11/08/12 1940 11/11/12 0403  WBC 6.1 7.4 5.4 5.4  NEUTROABS 3.9  --   --   --   HGB 13.1 12.7 12.0 11.1*  HCT 39.1 38.4 35.4* 33.2*  MCV 93 90.6 90.3 90.7  PLT  --  225 205 196   Lipid  Panel:  Recent Labs  10/07/12 1032  HDL 59  LDLCALC 133*  TRIG 74  CHOLHDL 3.5   Lab Results  Component Value Date   HGBA1C 5.6 10/07/2012    Assessment/Plan 1. Acute maxillary sinusitis -seems she has this--no evidence of cellulitis of face - azithromycin (ZITHROMAX) 250 MG tablet; 2 tabs today and one tab daily for 4 days  Dispense: 6 tablet; Refill: 0 - also advised to use a probiotic otc to make sure she doesn't get diarrhea -may continue robitussin cough syrup to help with symptoms  2. Essential hypertension, benign -I had increased her metoprolol dose between visits from 25mg  to 50mg  - metoprolol  Take 2 tablets (50 mg total) by mouth 2 (two) times daily.  Dispense: 60 tablet; Refill: 0  3. Parkinson disease -moderate at this point--has classic presentation -continue sinemet and f/u with [facility]  Next appt:  As scheduled in march unless she is not improving over the next week

## 2013-01-11 DIAGNOSIS — G20 Parkinson's disease: Secondary | ICD-10-CM

## 2013-01-11 DIAGNOSIS — I5033 Acute on chronic diastolic (congestive) heart failure: Secondary | ICD-10-CM

## 2013-01-11 DIAGNOSIS — R269 Unspecified abnormalities of gait and mobility: Secondary | ICD-10-CM

## 2013-01-11 DIAGNOSIS — M4800 Spinal stenosis, site unspecified: Secondary | ICD-10-CM

## 2013-01-11 DIAGNOSIS — G20A1 Parkinson's disease without dyskinesia, without mention of fluctuations: Secondary | ICD-10-CM

## 2013-02-02 ENCOUNTER — Telehealth: Payer: Self-pay | Admitting: *Deleted

## 2013-02-02 NOTE — Telephone Encounter (Signed)
Patient has been scheduled/ confirmed with daughter

## 2013-02-02 NOTE — Telephone Encounter (Signed)
I suggest, we discuss this during a followup appointment. Please arrange for an appointment in a followup slot, thx

## 2013-02-03 ENCOUNTER — Ambulatory Visit (INDEPENDENT_AMBULATORY_CARE_PROVIDER_SITE_OTHER): Payer: Medicare Other | Admitting: Neurology

## 2013-02-03 ENCOUNTER — Encounter (INDEPENDENT_AMBULATORY_CARE_PROVIDER_SITE_OTHER): Payer: Self-pay

## 2013-02-03 ENCOUNTER — Encounter: Payer: Self-pay | Admitting: Neurology

## 2013-02-03 VITALS — BP 182/84 | HR 60 | Temp 97.5°F | Ht 63.0 in | Wt 218.0 lb

## 2013-02-03 DIAGNOSIS — R413 Other amnesia: Secondary | ICD-10-CM

## 2013-02-03 DIAGNOSIS — G4752 REM sleep behavior disorder: Secondary | ICD-10-CM

## 2013-02-03 DIAGNOSIS — G4700 Insomnia, unspecified: Secondary | ICD-10-CM

## 2013-02-03 DIAGNOSIS — G20 Parkinson's disease: Secondary | ICD-10-CM

## 2013-02-03 MED ORDER — CLONAZEPAM 0.5 MG PO TABS: 0.2500 mg | ORAL_TABLET | Freq: Every day | ORAL | Status: DC

## 2013-02-03 MED ORDER — CARBIDOPA-LEVODOPA-ENTACAPONE 37.5-150-200 MG PO TABS: 1.0000 | ORAL_TABLET | Freq: Every day | ORAL | Status: DC

## 2013-02-03 NOTE — Patient Instructions (Signed)
I think your Parkinson's disease has remained fairly stable, which is reassuring. Nevertheless, as you know, this disease does progress with time. It can affect your balance, your memory, your mood, your bowel and bladder function, your posture, balance and walking. Overall you are doing fairly well but I do want to suggest a few things today:  Remember to drink plenty of fluid, eat healthy meals and do not skip any meals. Try to eat protein with a every meal and eat a healthy snack such as fruit or nuts in between meals. Try to keep a regular sleep-wake schedule and try to exercise daily, particularly in the form of walking, 10 to 20 minutes a day, if you can, divided and use your walker at all times.   Taking your medication on schedule is key.   Try to stay active physically and mentally. Engage in social activities in your community and with your family and try to keep up with current events by reading the newspaper or watching the news. Try to do word puzzles and you may like to do word puzzles and brain games on the computer such as on [email or web address]   As far as your medications are concerned, I would like to suggest that you take your current medication with the following additional changes: stop seroquel. Star clonazepam at night 0.5 mg 1/2 pill at bedtime. And we will increase the Stalevo to 5 times a day, at 8 AM, 11 AM, 2 PM, 5 PM and 8 PM.    As far as diagnostic testing, I will order: no new test.  I would like to see you back in 3 months, sooner if we need to. Please call us with any interim questions, concerns, problems, updates or refill requests.  Please also call us for any test results so we can go over those with you on the phone. Richardson Landry is my clinical assistant and will answer any of your questions and relay your messages to me and also relay most of my messages to you.  Our phone number is [phone]. We also have an after hours call service for urgent matters and there is a  physician on-call for urgent questions, that cannot wait till the next work day. For any emergencies you know to call 911 or go to the nearest emergency room.

## 2013-02-03 NOTE — Progress Notes (Signed)
Subjective:    Patient ID: Andrea Dorsey is a 80 y.o. female.  HPI  Interim history:   Andrea Dorsey is a very pleasant 80 year old right-handed woman with an underlying medical history of spinal stenosis, hypothyroidism, osteoarthritis, hypertension, depression, obesity, who presents for followup consultation of her right-sided predominant Parkinson's disease, complicated by hallucinations, memory loss, mood disorder including anxiety and depression, sleep disorder including insomnia, OSA and RBD. She is accompanied by her daughter again today. I first met her on 10/15/2012, at which time a continued her Stalevo. I suggested a small dose of Seroquel to help her sleep and tone down the REM behavior disorder and encouraged him to discuss with her primary care physician the addition of an antidepressant. She presents with a complaint of worsening tremors.  Today, her daughter states, that the Seroquel is not helping and she is still on 1/2 pill at night. She has been in [facility]F, [facility] [address] since 10/18/12. She staff reports that patient is not snoring, per daughter. She has had more tremors now also on the L. She has been using a rolling walker for 3 years. She has b/l knee pain, and has had X rays before. She is on tramodol, which is worsening her constipation. She uses Vitamin E cream to her R leg, which was grafted. She has exercises 2 times a week at her ALF. She had some benefit from water exercises in the past, but the chlorine caused itchy skin. She has not fallen recently. She still does not sleep well at night. She does not rest well. She still has vivid dreams and tends to act out in her sleep.  She previously used to see a neurologist at [facility], when she lived in [place], [place]. She was diagnosed with PD about 12 years ago when she was still residing in [place]. She needs assistance with her ADLs. She has been living with her daughter and son-in-law and they have looked into  the possibility of a long-term care facility but the patient has been resistant. She has had problems at night including sundowning, confusion, inability to sleep.  Her symptoms started on one side with tremors, but the patient was not sure which side. She has been on Stalevo for the past 2 years, and prior to that she was on C/L, and prior to that she was on Amantadine. She may not have tried a dopamine agonist or rasagiline in the past. She has been experiencing nausea with her PD medications and still has occasional nausea. In March 2014 she developed necrotizing fasciitis and needed debridement and grafting. She developed hallucinations at the time, but was on pain medications at the time, but the [facility] persisted beyond that. She also started having memory loss then. She developed cellulitis with complications in her jaw and needed all remaining teeth removed and had IV antibiotics in mid-2014. She has no FHx of PD or dementia. She has no Hx of psychiatric premorbid illness. In 2006 she had back surgery. She has no exposure to chemicals, or agent orange. She has been an anxious person. She is not able to sleep at night. She was tried on Ambien and amitriptyline. She has difficulty with sleep onset and sleep maintenance.  She has occasional urinary incontinence, occasional constipation. She snores, and needed to have a sleep study, but did not go. She has had some dream enactments and has slid out of bed.  She has been very opposed to going into assisted living, but  understood that she has a complex medical history and multiple issues and advanced Parkinson's disease and that she was not safe to live by herself.   Her Past Medical History Is Significant For: Past Medical History  Diagnosis Date  . Benign essential hypertension   . Hypothyroidism   . Osteoarthritis, generalized   . Parkinson disease   . Spinal stenosis   . History of necrotizing fascIItis     left leg, s/p debridement and graft  .  Dementia in Parkinson's disease   . Depression     Her Past Surgical History Is Significant For: Past Surgical History  Procedure Laterality Date  . Skin debridement  2014    Brambhelt, MD  . Skin graft  2014    Brambhelt MD  . Spine surgery  2006    spinal stenosis  . Abdominal hysterectomy  1977    Her Family History Is Significant For: Family History  Problem Relation Age of Onset  . Heart disease Mother   . Heart disease Sister   . Hypertension Sister   . Stroke Sister   . Heart disease Sister     heart attack  . Cancer Sister     colon    Her Social History Is Significant For: History   Social History  . Marital Status: Widowed    Spouse Name: N/A    Number of Children: N/A  . Years of Education: N/A   Social History Main Topics  . Smoking status: Never Smoker   . Smokeless tobacco: None  . Alcohol Use: No  . Drug Use: No  . Sexual Activity: Not Currently   Other Topics Concern  . None   Social History Narrative  . None    Her Allergies Are:  Allergies  Allergen Reactions  . Ivp Dye [Iodinated Diagnostic Agents]     Only when intravenous, not on external skin.  . Clindamycin Rash    Unclear whether patient has an actual allergy to clindamycin. On beta-lactam at the same time.  :   Her Current Medications Are:  Outpatient Encounter Prescriptions as of 02/03/2013  Medication Sig  . acetaminophen (TYLENOL) 500 MG tablet Take 500 mg by mouth every 6 (six) hours as needed for pain.  Marland Kitchen aspirin 81 MG EC tablet Take 1 tablet (81 mg total) by mouth daily.  . carbidopa-levodopa-entacapone (STALEVO) 37.5-150-200 MG per tablet Take 1 tablet by mouth 4 (four) times daily.  . Cholecalciferol (VITAMIN D3) 2000 UNITS TABS Take by mouth daily.  . diclofenac sodium (VOLTAREN) 1 % GEL Apply 4 g topically 4 (four) times daily.  . furosemide (LASIX) 20 MG tablet Take 20 mg by mouth daily.  Marland Kitchen guaiFENesin-dextromethorphan (ROBITUSSIN DM) 100-10 MG/5ML syrup Take 5  mLs by mouth every 4 (four) hours as needed for cough.  . levothyroxine (SYNTHROID, LEVOTHROID) 25 MCG tablet Take 25 mcg by mouth daily before breakfast.  . lisinopril (PRINIVIL,ZESTRIL) 20 MG tablet Take 1 tablet (20 mg total) by mouth daily.  . metoprolol succinate (TOPROL-XL) 25 MG 24 hr tablet Take 2 tablets (50 mg total) by mouth 2 (two) times daily.  Marland Kitchen nystatin (MYCOSTATIN/NYSTOP) 100000 UNIT/GM POWD Apply topically as needed.  . potassium chloride SA (K-DUR,KLOR-CON) 20 MEQ tablet Take 1 tablet (20 mEq total) by mouth daily.  . QUEtiapine Fumarate (SEROQUEL PO) Take 0.5 tablets by mouth at bedtime.  . senna (SENOKOT) 8.6 MG tablet Take 1 tablet by mouth as needed for constipation.  . traMADol (ULTRAM) 50 MG tablet  Take 1 tablet (50 mg total) by mouth every 8 (eight) hours as needed for pain.  . [DISCONTINUED] azithromycin (ZITHROMAX) 250 MG tablet 2 tabs today and one tab daily for 4 days   Review of Systems:  Out of a complete 14 point review of systems, all are reviewed and negative with the exception of these symptoms as listed below:  Review of Systems  Constitutional: Negative.   HENT: Positive for hearing loss and rhinorrhea.   Eyes: Negative.   Respiratory: Positive for cough.   Cardiovascular:       Congestive heart failure  Gastrointestinal: Positive for nausea and constipation.  Endocrine: Negative.   Genitourinary: Positive for frequency.  Musculoskeletal: Positive for arthralgias and back pain.  Skin: Negative.   Allergic/Immunologic: Negative.   Neurological: Negative.   Hematological: Negative.   Psychiatric/Behavioral: Positive for sleep disturbance (insomnia, apnea, frequent waking).    Objective:  Neurologic Exam  Physical Exam Physical Examination:   Filed Vitals:   02/03/13 1226  BP: 182/84  Pulse: 60  Temp:    General Examination: The patient is a very pleasant 80 y.o. female in no acute distress. She is obese. She appears frail and  deconditioned. She does not provide much in the way of history today.  HEENT: Normocephalic, atraumatic, pupils are equal, round and reactive to light and accommodation. Extraocular tracking shows moderate saccadic breakdown without nystagmus noted. There is limitation to upper gaze. There is mild decrease in eye blink rate. Hearing is impaired mildly. Tympanic membranes are clear bilaterally. Face is symmetric with moderate facial masking and normal facial sensation. There is no lip, neck or jaw tremor. Neck is moderately rigid with intact passive ROM. There are no carotid bruits on auscultation. Oropharynx exam reveals mild mouth dryness. There is significant airway crowding noted d/t redundant soft palate and large tongue. Mallampati is class III. Tongue protrudes centrally and palate elevates symmetrically. There is mild drooling.   Chest: is clear to auscultation without wheezing, rhonchi or crackles noted.  Heart: sounds are regular and normal without murmurs, rubs or gallops noted.   Abdomen: is soft, non-tender and non-distended with normal bowel sounds appreciated on auscultation.  Extremities: There is 3+ pitting edema in the distal lower extremities bilaterally. Pedal pulses are intact. Chronic stasis-like changes are noted in the distal legs bilaterally with s/p skin grafting. There are no varicose veins.  Skin: is warm and dry with no trophic changes noted. Age-related changes are noted on the skin.   Musculoskeletal: exam reveals no obvious joint deformities, tenderness, joint swelling or erythema.  Neurologically:  Mental status: The patient is awake and alert, paying good  attention. She is able to partially provide the history. Her daughter provides details or most of the entire history. She is oriented to: person, place, time/date and situation. Her memory, attention, language and knowledge are impaired mildly. There is no aphasia, agnosia, apraxia or anomia. There is a moderate  degree of bradyphrenia. Speech is moderately hypophonic with mild dysarthria noted. Mood is congruent and affect is normal.    Cranial nerves are as described above under HEENT exam. In addition, shoulder shrug is normal with equal shoulder height noted.  Motor exam: Normal bulk, and strength for age is noted. There are no dyskinesias noted.  Tone is mildly rigid with presence of cogwheeling in the right upper extremity. There is overall moderate bradykinesia. There is no drift or rebound.  There is an intermittent mild to moderate UE resting tremor on the right  and a slight intermittent resting tremor in the left upper extremity.  Romberg is not tested.   Reflexes are 1+ in the upper extremities and trace in the knees and absent in the ankle.   Fine motor skills exam: Finger taps are moderately impaired on the right and mildly impaired on the left. Hand movements are moderately impaired on the right and mildly impaired on the left. RAP (rapid alternating patting) is mildly impaired on the right and mildly impaired on the left. Foot taps are moderately impaired on the right and moderately impaired on the left. Foot agility (in the form of heel stomping) is moderately impaired on the right and moderately impaired on the left.    Cerebellar testing shows no dysmetria or intention tremor on finger to nose testing. Heel to shin is unremarkable bilaterally. There is no truncal or gait ataxia.   Sensory exam is intact to light touch, pinprick, vibration, temperature sense and proprioception in the upper and lower extremities.   Gait, station and balance: She stands up from the seated position with significant difficulty and needs to push herself up with her hands and also requires assistance. No veers backwards and to the right side. She is noted to lean to the R side. Posture is moderately stooped. Stance is wide-based. She walks with significant decrease in stride length and pace and has to rely on her  rolling walker which she brought in today. Tandem walk is not possible. Balance is moderately impaired. She is not able to do a toe or heel stance.     Assessment and Plan:   In summary, Andrea Dorsey is a very pleasant 80 year old female with an underlying medical history of spinal stenosis, hypothyroidism, osteoarthritis, hypertension, depression, obesity, necrotizing fasciitis of her right leg, history of cellulitis of her jaw, who presents for FU consultation of her right sided predominant Parkinson's, complicated by hallucinations, anxiety, depression, insomnia and RBD and overall deconditioning. She has been in assisted living since late September of last year. I again spent a long time with the patient and her daughter today, discussing her symptoms, her advanced Parkinson's disease, her complications, her fall risk, her mood disorder and her sleep disorder. I suggested that we try to increase Stalevo to 5 times a day, namely at 8, 11, 2 PM, 5 PM, and 8 PM. I did ask her daughter to watch for side effects including increase in hallucinations, increase in sedation, balance problems, drop in blood pressure values. I also asked her to stop the Seroquel. For her insomnia and RBD we can try very low-dose clonazepam. She will start at 0.5 mg strength with half a pill each night. I provided him prescriptions as well as an order for the assisted living facility to execute these changes. Even though we have to be cautious with a benzodiazepine, I do think that a very small dose of clonazepam may help relax her at night and cause vivid dream activity to reduce. She is advised to use a rolling walker at all times. I would like to see her back in 3 months from now, sooner if the need arises. I answered all their questions today and have encouraged them with any questions, concerns, or problems in the interim.

## 2013-02-07 ENCOUNTER — Other Ambulatory Visit: Payer: Self-pay | Admitting: *Deleted

## 2013-02-07 ENCOUNTER — Telehealth: Payer: Self-pay | Admitting: Neurology

## 2013-02-07 MED ORDER — CLONAZEPAM 0.5 MG PO TABS: ORAL_TABLET | ORAL | Status: DC

## 2013-02-07 NOTE — Telephone Encounter (Signed)
NEEDS QUANITY FOR CLONAZEPAM

## 2013-02-07 NOTE — Telephone Encounter (Signed)
The quantity is on the Rx, as the system will not allow [place] to prescribe meds without a qnty.   I called back.  Spoke with [employer].  He transferred me to [facility].  She will fill the Rx as prescribed.

## 2013-02-09 ENCOUNTER — Other Ambulatory Visit: Payer: Self-pay | Admitting: *Deleted

## 2013-02-09 DIAGNOSIS — I10 Essential (primary) hypertension: Secondary | ICD-10-CM

## 2013-02-09 MED ORDER — TRAMADOL HCL 50 MG PO TABS: 50.0000 mg | ORAL_TABLET | Freq: Three times a day (TID) | ORAL | Status: DC | PRN

## 2013-02-09 MED ORDER — NYSTATIN 100000 UNIT/GM EX POWD: CUTANEOUS | Status: DC

## 2013-02-09 MED ORDER — LEVOTHYROXINE SODIUM 25 MCG PO TABS: 25.0000 ug | ORAL_TABLET | Freq: Every day | ORAL | Status: DC

## 2013-02-09 MED ORDER — FUROSEMIDE 20 MG PO TABS
20.0000 mg | ORAL_TABLET | Freq: Every day | ORAL | Status: DC
Start: 2013-02-09 — End: 2013-03-13

## 2013-02-09 MED ORDER — LISINOPRIL 20 MG PO TABS: 20.0000 mg | ORAL_TABLET | Freq: Every day | ORAL | Status: DC

## 2013-02-09 MED ORDER — METOPROLOL SUCCINATE ER 25 MG PO TB24: 50.0000 mg | ORAL_TABLET | Freq: Two times a day (BID) | ORAL | Status: DC

## 2013-02-09 MED ORDER — DICLOFENAC SODIUM 1 % TD GEL: 4.0000 g | Freq: Four times a day (QID) | TRANSDERMAL | Status: DC

## 2013-02-15 ENCOUNTER — Other Ambulatory Visit: Payer: Self-pay | Admitting: *Deleted

## 2013-02-15 MED ORDER — AMPICILLIN 500 MG PO CAPS: ORAL_CAPSULE | ORAL | Status: DC

## 2013-02-17 ENCOUNTER — Ambulatory Visit (INDEPENDENT_AMBULATORY_CARE_PROVIDER_SITE_OTHER): Payer: Medicare Other | Admitting: Internal Medicine

## 2013-02-17 ENCOUNTER — Encounter: Payer: Self-pay | Admitting: Internal Medicine

## 2013-02-17 VITALS — BP 162/80 | HR 58 | Temp 96.2°F | Wt 219.0 lb

## 2013-02-17 DIAGNOSIS — I10 Essential (primary) hypertension: Secondary | ICD-10-CM

## 2013-02-17 DIAGNOSIS — G20 Parkinson's disease: Secondary | ICD-10-CM

## 2013-02-17 DIAGNOSIS — M542 Cervicalgia: Secondary | ICD-10-CM

## 2013-02-17 DIAGNOSIS — G20A1 Parkinson's disease without dyskinesia, without mention of fluctuations: Secondary | ICD-10-CM

## 2013-02-17 MED ORDER — VALSARTAN 40 MG PO TABS: 40.0000 mg | ORAL_TABLET | Freq: Every day | ORAL | Status: DC

## 2013-02-17 NOTE — Progress Notes (Signed)
Patient ID: Andrea Dorsey, female   DOB: 1929/07/09, 80 y.o.   MRN: [id number]   Location:  [facility]  Allergies  Allergen Reactions  . Ivp Dye [Iodinated Diagnostic Agents]     Only when intravenous, not on external skin.  . Clindamycin Rash    Unclear whether patient has an actual allergy to clindamycin. On beta-lactam at the same time.    Chief Complaint  Patient presents with  . Acute Visit    elevated BP 180/66- 180/80, other than that 140/60 - 164/94    HPI: Patient is a 80 y.o. female seen in the office today for an acute visit due to persistent hypertension.  Unfortunately, she has a h/o orthostatic hypotension related to her Parkinson's disease, as well.  Has been high past couple of days especially.  Knees were very stiff and painful.  Was also anxious due to the pain.    Review of Systems:  Review of Systems  Constitutional: Positive for malaise/fatigue.  HENT: Negative for congestion.   Eyes: Negative for blurred vision.  Respiratory: Negative for shortness of breath.   Cardiovascular: Negative for chest pain.  Gastrointestinal: Positive for constipation.  Genitourinary: Negative for dysuria.  Musculoskeletal: Positive for back pain, falls and joint pain.  Skin: Negative for rash.       H/o necrotizing fasciitis right leg  Neurological: Positive for tremors, sensory change and weakness.  Psychiatric/Behavioral: Positive for depression and memory loss.    Past Medical History  Diagnosis Date  . Benign essential hypertension   . Hypothyroidism   . Osteoarthritis, generalized   . Parkinson disease   . Spinal stenosis   . History of necrotizing fascIItis     left leg, s/p debridement and graft  . Dementia in Parkinson's disease   . Depression     Past Surgical History  Procedure Laterality Date  . Skin debridement  2014    Brambhelt, MD  . Skin graft  2014    Brambhelt MD  . Spine surgery  2006    spinal  stenosis  . Abdominal hysterectomy  1977    Social History:   reports that she has never smoked. She does not have any smokeless tobacco history on file. She reports that she does not drink alcohol or use illicit drugs.  Family History  Problem Relation Age of Onset  . Heart disease Mother   . Heart disease Sister   . Hypertension Sister   . Stroke Sister   . Heart disease Sister     heart attack  . Cancer Sister     colon    Medications: Patient's Medications  New Prescriptions   No medications on file  Previous Medications   ACETAMINOPHEN (TYLENOL) 500 MG TABLET    Take 500 mg by mouth every 6 (six) hours as needed for pain.   AMPICILLIN (PRINCIPEN) 500 MG CAPSULE    Take one tablet three time daily for infection for one week   ASPIRIN 81 MG EC TABLET    Take 1 tablet (81 mg total) by mouth daily.   CARBIDOPA-LEVODOPA-ENTACAPONE (STALEVO) 37.5-150-200 MG PER TABLET    Take 1 tablet by mouth 5 (five) times daily. At 8 AM, 11 AM, 2 PM, 5 PM, and 8 PM   CHOLECALCIFEROL (VITAMIN D3) 2000 UNITS TABS    Take by mouth daily.   CLONAZEPAM (KLONOPIN) 0.5 MG TABLET    Take 1/2 tablet by mouth at bedtime   FUROSEMIDE (LASIX)  20 MG TABLET    Take 1 tablet (20 mg total) by mouth daily.   GUAIFENESIN-DEXTROMETHORPHAN (ROBITUSSIN DM) 100-10 MG/5ML SYRUP    Take 5 mLs by mouth every 4 (four) hours as needed for cough.   LEVOTHYROXINE (SYNTHROID, LEVOTHROID) 25 MCG TABLET    Take 1 tablet (25 mcg total) by mouth daily before breakfast.   LISINOPRIL (PRINIVIL,ZESTRIL) 20 MG TABLET    Take 1 tablet (20 mg total) by mouth daily.   METOPROLOL SUCCINATE (TOPROL-XL) 25 MG 24 HR TABLET    Take 2 tablets (50 mg total) by mouth 2 (two) times daily.   NYSTATIN (MYCOSTATIN/NYSTOP) 100000 UNIT/GM POWD    Apply topically as needed   POTASSIUM CHLORIDE SA (K-DUR,KLOR-CON) 20 MEQ TABLET    Take 1 tablet (20 mEq total) by mouth daily.   QUETIAPINE FUMARATE (SEROQUEL PO)    Take 0.5 tablets by mouth at bedtime.    SENNA (SENOKOT) 8.6 MG TABLET    Take 1 tablet by mouth as needed for constipation.   TRAMADOL (ULTRAM) 50 MG TABLET    Take 1 tablet (50 mg total) by mouth every 8 (eight) hours as needed.  Modified Medications   Modified Medication Previous Medication   DICLOFENAC SODIUM (VOLTAREN) 1 % GEL diclofenac sodium (VOLTAREN) 1 % GEL      4 g. Use twice a daily, morning & evening for knee pain    Apply 4 g topically 4 (four) times daily.  Discontinued Medications   No medications on file     Physical Exam: Filed Vitals:   02/17/13 0859  BP: 162/80  Pulse: 58  Temp: 96.2 F (35.7 C)  TempSrc: Oral  Weight: 219 lb (99.338 kg)  SpO2: 98%  Physical Exam  Constitutional: She appears well-developed and well-nourished. No distress.  HENT:  Head: Normocephalic and atraumatic.  Cardiovascular: Normal rate, regular rhythm, normal heart sounds and intact distal pulses.   Pulmonary/Chest: Effort normal and breath sounds normal. She has no rales.  Abdominal: Soft. Bowel sounds are normal. She exhibits no distension and no mass. There is no tenderness.  Musculoskeletal: She exhibits edema.   Comes in wheelchair  Neurological: She is alert.  Resting tremors, cogwheeling rigidity  Skin:  Right lower leg skin graft  Psychiatric:  Flat affect, hypophonia    Labs reviewed: Basic Metabolic Panel:  Recent Labs  10/07/12 1032  11/08/12 1940  11/10/12 0510 11/11/12 0403 12/20/12 1429  NA 142  < >  --   < > 142 142 142  K 4.2  < >  --   < > 3.6 3.7 4.3  CL 104  < >  --   < > 106 109 103  CO2 22  < >  --   < > 26 24 25   GLUCOSE 105*  < >  --   < > 101* 105* 114*  BUN 12  < >  --   < > 16 16 15   CREATININE 1.12*  < > 1.05  < > 1.06 1.04 1.23*  CALCIUM 9.9  < >  --   < > 9.5 9.2 10.2  TSH 3.700  --  4.929*  --   --   --   --   < > = values in this interval not displayed. Liver Function Tests:  Recent Labs  10/07/12 1032  AST 15  ALT 7  ALKPHOS 91  BILITOT 0.9  PROT 6.2   CBC:  Recent Labs  10/07/12 1032 11/08/12 1532 11/08/12  1940 11/11/12 0403  WBC 6.1 7.4 5.4 5.4  NEUTROABS 3.9  --   --   --   HGB 13.1 12.7 12.0 11.1*  HCT 39.1 38.4 35.4* 33.2*  MCV 93 90.6 90.3 90.7  PLT  --  225 205 196   Lipid Panel:  Recent Labs  10/07/12 1032  HDL 59  LDLCALC 133*  TRIG 74  CHOLHDL 3.5   Lab Results  Component Value Date   HGBA1C 5.6 10/07/2012    Assessment/Plan 1. Essential hypertension, benign -was here for acute visit due to bps running over 419 systolic (some reading as high as 180s), but diovan for use over 150 was not being given despite orders -pain not well controlled and this seems to be a large contributor to her bp control -see below -decrease bp checks to once a day -must not lower her bp too much due to her orthostatic hypotension and fall history  2. Parkinson disease -follows with [facility] -cont sinemet, seroquel, clonazepam per neuro  3. Muscle pain, cervical -seems to be contributing to her elevated bps -use tramadol for knee pain -use pillow around neck to help with muscle pains -tylenol prn mild pain  Labs/tests ordered:  No orders of the defined types were placed in this encounter.    Next appt:  Keep as scheduled

## 2013-03-01 ENCOUNTER — Encounter: Payer: Self-pay | Admitting: Internal Medicine

## 2013-03-07 LAB — FECAL OCCULT BLOOD, GUAIAC: Fecal Occult Blood: NEGATIVE

## 2013-03-10 ENCOUNTER — Ambulatory Visit (INDEPENDENT_AMBULATORY_CARE_PROVIDER_SITE_OTHER): Payer: Medicare Other | Admitting: Internal Medicine

## 2013-03-10 ENCOUNTER — Encounter: Payer: Self-pay | Admitting: Internal Medicine

## 2013-03-10 VITALS — BP 164/86 | HR 63 | Temp 98.0°F | Resp 16 | Wt 214.0 lb

## 2013-03-10 DIAGNOSIS — I509 Heart failure, unspecified: Secondary | ICD-10-CM

## 2013-03-10 DIAGNOSIS — I5032 Chronic diastolic (congestive) heart failure: Secondary | ICD-10-CM

## 2013-03-10 DIAGNOSIS — R319 Hematuria, unspecified: Secondary | ICD-10-CM

## 2013-03-10 DIAGNOSIS — F0280 Dementia in other diseases classified elsewhere without behavioral disturbance: Secondary | ICD-10-CM

## 2013-03-10 DIAGNOSIS — R410 Disorientation, unspecified: Secondary | ICD-10-CM

## 2013-03-10 DIAGNOSIS — N3289 Other specified disorders of bladder: Secondary | ICD-10-CM

## 2013-03-10 DIAGNOSIS — G20A1 Parkinson's disease without dyskinesia, without mention of fluctuations: Secondary | ICD-10-CM

## 2013-03-10 DIAGNOSIS — R404 Transient alteration of awareness: Secondary | ICD-10-CM

## 2013-03-10 DIAGNOSIS — G20 Parkinson's disease: Secondary | ICD-10-CM

## 2013-03-10 LAB — POCT URINALYSIS DIPSTICK

## 2013-03-10 MED ORDER — PHENAZOPYRIDINE HCL 200 MG PO TABS: 200.0000 mg | ORAL_TABLET | Freq: Three times a day (TID) | ORAL | Status: AC

## 2013-03-10 NOTE — Progress Notes (Signed)
Patient ID: Andrea Dorsey, female   DOB: 11/27/1929, 80 y.o.   MRN: [id number]   Location:  [facility]  Code Status: DNR   Allergies  Allergen Reactions  . Ivp Dye [Iodinated Diagnostic Agents]     Only when intravenous, not on external skin.  . Clindamycin Rash    Unclear whether patient has an actual allergy to clindamycin. On beta-lactam at the same time.    Chief Complaint  Patient presents with  . Acute Visit    confusion, frequent urination, lower abdominal/pelvic pain x 2 days    HPI: Patient is a 80 y.o. black female seen in the office today for an acute visit due to increased confusion, urinary frequency and lower abdominal and pelvic pain for 2 days.   Feels weak getting in and out of the bed.  Finished ampicillin for a UTI.  Does not see a urologist.  Today's urine sample shows only hematuria.  Had urinated 10x back to back yesterday.  Then slowed down.  Has been drinking the cranberry juice.  Has a continuous pressure sensation in her suprapubic area.    BP has been up off and on over the course of the week.  High this am--164/86 and repeat almost identical at 162/84.  Stalevo seems to be helping tremors, but pt otherwise declining with more difficulty with urinary retention and frequency as above.    Review of Systems:  Review of Systems  Constitutional: Negative for fever and chills.  HENT: Negative for congestion.   Respiratory: Negative for shortness of breath.   Cardiovascular: Negative for chest pain and leg swelling.       Edema much improved with lasix 20mg  daily  Gastrointestinal: Positive for abdominal pain. Negative for constipation.  Genitourinary: Positive for frequency and hematuria. Negative for dysuria, urgency and flank pain.       Suprapubic pain  Musculoskeletal: Positive for back pain. Negative for falls and myalgias.  Skin: Negative for rash.  Neurological: Negative for dizziness and loss of  consciousness.  Psychiatric/Behavioral: Positive for depression and memory loss.    Past Medical History  Diagnosis Date  . Benign essential hypertension   . Hypothyroidism   . Osteoarthritis, generalized   . Parkinson disease   . Spinal stenosis   . History of necrotizing fascIItis     left leg, s/p debridement and graft  . Dementia in Parkinson's disease   . Depression     Past Surgical History  Procedure Laterality Date  . Skin debridement  2014    Brambhelt, MD  . Skin graft  2014    Brambhelt MD  . Spine surgery  2006    spinal stenosis  . Abdominal hysterectomy  1977    Social History:   reports that she has never smoked. She does not have any smokeless tobacco history on file. She reports that she does not drink alcohol or use illicit drugs.  Family History  Problem Relation Age of Onset  . Heart disease Mother   . Heart disease Sister   . Hypertension Sister   . Stroke Sister   . Heart disease Sister     heart attack  . Cancer Sister     colon    Medications: Patient's Medications  New Prescriptions   No medications on file  Previous Medications   ACETAMINOPHEN (TYLENOL) 500 MG TABLET    Take 500 mg by mouth every 6 (six) hours as needed for pain.  AMPICILLIN (PRINCIPEN) 500 MG CAPSULE    Take one tablet three time daily for infection for one week   ASPIRIN 81 MG EC TABLET    Take 1 tablet (81 mg total) by mouth daily.   CARBIDOPA-LEVODOPA-ENTACAPONE (STALEVO) 37.5-150-200 MG PER TABLET    Take 1 tablet by mouth 5 (five) times daily. At 8 AM, 11 AM, 2 PM, 5 PM, and 8 PM   CHOLECALCIFEROL (VITAMIN D3) 2000 UNITS TABS    Take by mouth daily.   CLONAZEPAM (KLONOPIN) 0.5 MG TABLET    Take 1/2 tablet by mouth at bedtime   DICLOFENAC SODIUM (VOLTAREN) 1 % GEL    4 g. Use twice a daily, morning & evening for knee pain   FUROSEMIDE (LASIX) 20 MG TABLET    Take 1 tablet (20 mg total) by mouth daily.   GUAIFENESIN-DEXTROMETHORPHAN (ROBITUSSIN DM) 100-10 MG/5ML  SYRUP    Take 5 mLs by mouth every 4 (four) hours as needed for cough.   LEVOTHYROXINE (SYNTHROID, LEVOTHROID) 25 MCG TABLET    Take 1 tablet (25 mcg total) by mouth daily before breakfast.   LISINOPRIL (PRINIVIL,ZESTRIL) 20 MG TABLET    Take 1 tablet (20 mg total) by mouth daily.   METOPROLOL SUCCINATE (TOPROL-XL) 25 MG 24 HR TABLET    Take 2 tablets (50 mg total) by mouth 2 (two) times daily.   NYSTATIN (MYCOSTATIN/NYSTOP) 100000 UNIT/GM POWD    Apply topically as needed   POTASSIUM CHLORIDE SA (K-DUR,KLOR-CON) 20 MEQ TABLET    Take 1 tablet (20 mEq total) by mouth daily.   RANITIDINE (ZANTAC) 150 MG TABLET    Take 150 mg by mouth as needed for heartburn. Take 1 tablet by mouth as needed for acid reflux.   SENNA (SENOKOT) 8.6 MG TABLET    Take 1 tablet by mouth as needed for constipation.   TRAMADOL (ULTRAM) 50 MG TABLET    Take 1 tablet (50 mg total) by mouth every 8 (eight) hours as needed.   VALSARTAN (DIOVAN) 40 MG TABLET    Take 1 tablet (40 mg total) by mouth daily. As need for systolic blood pressure over 150  Modified Medications   No medications on file  Discontinued Medications   QUETIAPINE FUMARATE (SEROQUEL PO)    Take 0.5 tablets by mouth at bedtime.     Physical Exam: Filed Vitals:   03/10/13 1043  BP: 164/86  Pulse: 63  Temp: 98 F (36.7 C)  TempSrc: Oral  Resp: 16  Weight: 214 lb (97.07 kg)  SpO2: 98%  Physical Exam  Constitutional: No distress.  Cardiovascular: Normal rate, regular rhythm, normal heart sounds and intact distal pulses.   Pulmonary/Chest: Effort normal and breath sounds normal. She has no rales.  Abdominal: Soft. Bowel sounds are normal. She exhibits no distension and no mass. There is tenderness.  Neurological: She is alert.  Resting tremor  Skin: Skin is warm and dry.  Psychiatric:  Flat affect     Labs reviewed: Basic Metabolic Panel:  Recent Labs  10/07/12 1032  11/08/12 1940  11/10/12 0510 11/11/12 0403 12/20/12 1429  NA 142  <  >  --   < > 142 142 142  K 4.2  < >  --   < > 3.6 3.7 4.3  CL 104  < >  --   < > 106 109 103  CO2 22  < >  --   < > 26 24 25   GLUCOSE 105*  < >  --   < >  101* 105* 114*  BUN 12  < >  --   < > 16 16 15   CREATININE 1.12*  < > 1.05  < > 1.06 1.04 1.23*  CALCIUM 9.9  < >  --   < > 9.5 9.2 10.2  TSH 3.700  --  4.929*  --   --   --   --   < > = values in this interval not displayed. Liver Function Tests:  Recent Labs  10/07/12 1032  AST 15  ALT 7  ALKPHOS 91  BILITOT 0.9  PROT 6.2  CBC:  Recent Labs  10/07/12 1032 11/08/12 1532 11/08/12 1940 11/11/12 0403  WBC 6.1 7.4 5.4 5.4  NEUTROABS 3.9  --   --   --   HGB 13.1 12.7 12.0 11.1*  HCT 39.1 38.4 35.4* 33.2*  MCV 93 90.6 90.3 90.7  PLT  --  225 205 196   Lipid Panel:  Recent Labs  10/07/12 1032  HDL 59  LDLCALC 133*  TRIG 74  CHOLHDL 3.5   Lab Results  Component Value Date   HGBA1C 5.6 10/07/2012   Assessment/Plan 1. Delirium -seems her baseline at visit today, but confusion noted at facility over past couple of days -no uti at this time, but just treated for one and having hematuria and bladder spasms at this time  2. Blood in urine - Culture, Urine -f/u urinalysis at facility in 1 wk - phenazopyridine (PYRIDIUM) 200 MG tablet; Take 1 tablet (200 mg total) by mouth 3 (three) times daily with meals. For bladder spasms  Dispense: 12 tablet; Refill: 0  3. Chronic diastolic CHF (congestive heart failure) -will reduce lasix 20mg  to every other day, but continue daily weights with notification of myself for weight gain of 3# in 1 day or 5# in 1 wk -I am concerned she will gain fluid wait as soon as this reduction is done, but her daughter and pt are hoping it will help with her urinary frequency  4. Dementia in Parkinson's disease -currently worse--just finished ampicillin therapy for uti and now having similar symptoms, but ua only shows hematuria  5. Parkinson disease -cont stalevo, f/u with [name] as  planned -klonopin helping sleep   6. Bladder spasms - phenazopyridine (PYRIDIUM) 200 MG tablet; Take 1 tablet (200 mg total) by mouth 3 (three) times daily with meals. For bladder spasms  Dispense: 12 tablet; Refill: 0--only for short course due to risk of affecting renal function   Labs/tests ordered: UA repeat at facility in 1 wk for hematuria Next appt:  Keep in march

## 2013-03-11 ENCOUNTER — Telehealth: Payer: Self-pay | Admitting: *Deleted

## 2013-03-11 NOTE — Telephone Encounter (Signed)
Daughter, [name], called and stated that her mothers symptoms are no better. Told her that we are waiting on the urine culture and for her to increase her fluids and take the pyridium that Dr. [name] prescribed. She agreed.

## 2013-03-12 DIAGNOSIS — I5033 Acute on chronic diastolic (congestive) heart failure: Secondary | ICD-10-CM

## 2013-03-12 DIAGNOSIS — N390 Urinary tract infection, site not specified: Secondary | ICD-10-CM

## 2013-03-12 DIAGNOSIS — G20A1 Parkinson's disease without dyskinesia, without mention of fluctuations: Secondary | ICD-10-CM

## 2013-03-12 DIAGNOSIS — R269 Unspecified abnormalities of gait and mobility: Secondary | ICD-10-CM

## 2013-03-12 DIAGNOSIS — G20 Parkinson's disease: Secondary | ICD-10-CM

## 2013-03-12 DIAGNOSIS — F0390 Unspecified dementia without behavioral disturbance: Secondary | ICD-10-CM

## 2013-03-12 DIAGNOSIS — M4800 Spinal stenosis, site unspecified: Secondary | ICD-10-CM

## 2013-03-12 LAB — URINE CULTURE

## 2013-03-13 ENCOUNTER — Emergency Department (HOSPITAL_COMMUNITY)
Admission: EM | Admit: 2013-03-13 | Discharge: 2013-03-13 | Disposition: A | Discharge status: Home / Self Care | Payer: Medicare Other | Attending: Emergency Medicine | Admitting: Emergency Medicine

## 2013-03-13 ENCOUNTER — Encounter (HOSPITAL_COMMUNITY): Payer: Self-pay | Admitting: Emergency Medicine

## 2013-03-13 DIAGNOSIS — N390 Urinary tract infection, site not specified: Secondary | ICD-10-CM

## 2013-03-13 DIAGNOSIS — Z7982 Long term (current) use of aspirin: Secondary | ICD-10-CM | POA: Insufficient documentation

## 2013-03-13 DIAGNOSIS — IMO0001 Reserved for inherently not codable concepts without codable children: Secondary | ICD-10-CM

## 2013-03-13 DIAGNOSIS — M159 Polyosteoarthritis, unspecified: Secondary | ICD-10-CM | POA: Insufficient documentation

## 2013-03-13 DIAGNOSIS — Z79899 Other long term (current) drug therapy: Secondary | ICD-10-CM | POA: Insufficient documentation

## 2013-03-13 DIAGNOSIS — B372 Candidiasis of skin and nail: Secondary | ICD-10-CM | POA: Insufficient documentation

## 2013-03-13 DIAGNOSIS — E039 Hypothyroidism, unspecified: Secondary | ICD-10-CM | POA: Insufficient documentation

## 2013-03-13 DIAGNOSIS — Z792 Long term (current) use of antibiotics: Secondary | ICD-10-CM | POA: Insufficient documentation

## 2013-03-13 DIAGNOSIS — F329 Major depressive disorder, single episode, unspecified: Secondary | ICD-10-CM | POA: Insufficient documentation

## 2013-03-13 DIAGNOSIS — F3289 Other specified depressive episodes: Secondary | ICD-10-CM | POA: Insufficient documentation

## 2013-03-13 DIAGNOSIS — I10 Essential (primary) hypertension: Secondary | ICD-10-CM | POA: Insufficient documentation

## 2013-03-13 DIAGNOSIS — F0390 Unspecified dementia without behavioral disturbance: Secondary | ICD-10-CM | POA: Insufficient documentation

## 2013-03-13 DIAGNOSIS — G20 Parkinson's disease: Secondary | ICD-10-CM | POA: Insufficient documentation

## 2013-03-13 DIAGNOSIS — G20A1 Parkinson's disease without dyskinesia, without mention of fluctuations: Secondary | ICD-10-CM | POA: Insufficient documentation

## 2013-03-13 DIAGNOSIS — R030 Elevated blood-pressure reading, without diagnosis of hypertension: Secondary | ICD-10-CM

## 2013-03-13 LAB — CBC WITH DIFFERENTIAL/PLATELET
Basophils Absolute: 0 10*3/uL (ref 0.0–0.1)
Basophils Relative: 1 % (ref 0–1)
Eosinophils Absolute: 0.2 10*3/uL (ref 0.0–0.7)
Eosinophils Relative: 2 % (ref 0–5)
HCT: 43.7 % (ref 36.0–46.0)
Hemoglobin: 15.1 g/dL — ABNORMAL HIGH (ref 12.0–15.0)
Lymphocytes Relative: 21 % (ref 12–46)
Lymphs Abs: 1.4 10*3/uL (ref 0.7–4.0)
MCH: 29.6 pg (ref 26.0–34.0)
MCHC: 34.6 g/dL (ref 30.0–36.0)
MCV: 85.7 fL (ref 78.0–100.0)
Monocytes Absolute: 0.6 10*3/uL (ref 0.1–1.0)
Monocytes Relative: 9 % (ref 3–12)
Neutro Abs: 4.4 10*3/uL (ref 1.7–7.7)
Neutrophils Relative %: 67 % (ref 43–77)
Platelets: 201 10*3/uL (ref 150–400)
RBC: 5.1 MIL/uL (ref 3.87–5.11)
RDW: 15.7 % — ABNORMAL HIGH (ref 11.5–15.5)
WBC: 6.5 10*3/uL (ref 4.0–10.5)

## 2013-03-13 LAB — URINALYSIS, ROUTINE W REFLEX MICROSCOPIC
Glucose, UA: NEGATIVE mg/dL
Hgb urine dipstick: NEGATIVE
Ketones, ur: 15 mg/dL — AB
Nitrite: POSITIVE — AB
Protein, ur: >300 mg/dL — AB
Specific Gravity, Urine: 1.028 (ref 1.005–1.030)
Urobilinogen, UA: 1 mg/dL (ref 0.0–1.0)
pH: 6 (ref 5.0–8.0)

## 2013-03-13 LAB — HEPATIC FUNCTION PANEL
ALT: 5 U/L (ref 0–35)
AST: 21 U/L (ref 0–37)
Albumin: 3.6 g/dL (ref 3.5–5.2)
Alkaline Phosphatase: 99 U/L (ref 39–117)
Bilirubin, Direct: <0.2 mg/dL (ref 0.0–0.3)
Total Bilirubin: 0.9 mg/dL (ref 0.3–1.2)
Total Protein: 7.3 g/dL (ref 6.0–8.3)

## 2013-03-13 LAB — BASIC METABOLIC PANEL
BUN: 16 mg/dL (ref 6–23)
CO2: 24 mEq/L (ref 19–32)
Calcium: 10.1 mg/dL (ref 8.4–10.5)
Chloride: 104 mEq/L (ref 96–112)
Creatinine, Ser: 0.9 mg/dL (ref 0.50–1.10)
GFR calc Af Amer: 67 mL/min — ABNORMAL LOW (ref >90)
GFR calc non Af Amer: 58 mL/min — ABNORMAL LOW (ref >90)
Glucose, Bld: 143 mg/dL — ABNORMAL HIGH (ref 70–99)
Potassium: 4.9 mEq/L (ref 3.7–5.3)
Sodium: 140 mEq/L (ref 137–147)

## 2013-03-13 LAB — URINE MICROSCOPIC-ADD ON

## 2013-03-13 MED ORDER — NYSTATIN 100000 UNIT/GM EX CREA: TOPICAL_CREAM | CUTANEOUS | Status: DC

## 2013-03-13 MED ORDER — CEPHALEXIN 500 MG PO CAPS: 500.0000 mg | ORAL_CAPSULE | Freq: Two times a day (BID) | ORAL | Status: DC

## 2013-03-13 MED ORDER — LIDOCAINE HCL (PF) 1 % IJ SOLN
INTRAMUSCULAR | Status: AC
  Administered 2013-03-13: 2.1 mL via INTRAMUSCULAR
  Filled 2013-03-13: qty 5

## 2013-03-13 MED ORDER — CEFTRIAXONE SODIUM 1 G IJ SOLR
1.0000 g | INTRAMUSCULAR | Status: DC
  Administered 2013-03-13: 1 g via INTRAMUSCULAR
  Filled 2013-03-13: qty 10

## 2013-03-13 NOTE — ED Notes (Signed)
Family reports concerned for pt elevated BP, bladder issues and nausea. Pt seen by PMD this week, is currently being treated for bladder spasms. Pt denies pain at present.

## 2013-03-13 NOTE — ED Provider Notes (Signed)
CSN: [id number]     Arrival date & time 03/13/13  1916 History   First MD Initiated Contact with Patient 03/13/13 [phone]     Chief Complaint  Patient presents with  . Hypertension  . Abdominal Pain  . Nausea     (Consider location/radiation/quality/duration/timing/severity/associated sxs/prior Treatment) HPI Pt is an 80yo female with hx of HTN, Parkinson disease with dementia, necrotizing fascitis to right lower leg, brought in by family member for c/o persistent elevated BP. Family member reports pressure today was 180s-200s/90s.  Reports pt takes lisinopril and metoprolol daily and adds Diovan when systolic pressure >140.  Family member also mentions concern for change in appearance of right lower skin graft that started today. Reports increased redness and "red spots" around graft. Pt does reports some increased itching of right lower leg. Denies warmth or drainage.  Pt was seen by PCP on Thursday, 2/19 for tx of bladder spams  Past Medical History  Diagnosis Date  . Benign essential hypertension   . Hypothyroidism   . Osteoarthritis, generalized   . Parkinson disease   . Spinal stenosis   . History of necrotizing fascIItis     left leg, s/p debridement and graft  . Dementia in Parkinson's disease   . Depression    Past Surgical History  Procedure Laterality Date  . Skin debridement  2014    Brambhelt, MD  . Skin graft  2014    Brambhelt MD  . Spine surgery  2006    spinal stenosis  . Abdominal hysterectomy  1977   Family History  Problem Relation Age of Onset  . Heart disease Mother   . Heart disease Sister   . Hypertension Sister   . Stroke Sister   . Heart disease Sister     heart attack  . Cancer Sister     colon   History  Substance Use Topics  . Smoking status: Never Smoker   . Smokeless tobacco: Not on file  . Alcohol Use: No   OB History   Grav Para Term Preterm Abortions TAB SAB Ect Mult Living                 Review of Systems  Unable to perform  ROS: Dementia  Skin:       Changes to skin graft on right lower leg      Allergies  Ivp dye and Clindamycin  Home Medications   Current Outpatient Rx  Name  Route  Sig  Dispense  Refill  . acetaminophen (TYLENOL) 500 MG tablet   Oral   Take 500 mg by mouth every 6 (six) hours as needed for mild pain.          Marland Kitchen aspirin 81 MG EC tablet   Oral   Take 1 tablet (81 mg total) by mouth daily.   30 tablet   0   . carbidopa-levodopa-entacapone (STALEVO) 37.5-150-200 MG per tablet   Oral   Take 1 tablet by mouth 5 (five) times daily. At 8 AM, 11 AM, 2 PM, 5 PM, and 8 PM         . Cholecalciferol (VITAMIN D3) 2000 UNITS TABS   Oral   Take 2,000 Units by mouth daily.          . clonazePAM (KLONOPIN) 0.5 MG tablet   Oral   Take 0.5 mg by mouth at bedtime.         . diclofenac sodium (VOLTAREN) 1 % GEL   Topical  Apply 4 g topically 2 (two) times daily. for knee pain         . furosemide (LASIX) 20 MG tablet   Oral   Take 20 mg by mouth every other day.         . levothyroxine (SYNTHROID, LEVOTHROID) 25 MCG tablet   Oral   Take 1 tablet (25 mcg total) by mouth daily before breakfast.   90 tablet   3   . lisinopril (PRINIVIL,ZESTRIL) 20 MG tablet   Oral   Take 1 tablet (20 mg total) by mouth daily.   90 tablet   3   . metoprolol succinate (TOPROL-XL) 25 MG 24 hr tablet   Oral   Take 50 mg by mouth daily.         . potassium chloride SA (K-DUR,KLOR-CON) 20 MEQ tablet   Oral   Take 1 tablet (20 mEq total) by mouth daily.   30 tablet   0   . ranitidine (ZANTAC) 150 MG tablet   Oral   Take 150 mg by mouth as needed for heartburn. Take 1 tablet by mouth as needed for acid reflux.         . senna (SENOKOT) 8.6 MG tablet   Oral   Take 1 tablet by mouth daily as needed for constipation.          . traMADol (ULTRAM) 50 MG tablet   Oral   Take 1 tablet (50 mg total) by mouth every 8 (eight) hours as needed.   90 tablet   0   . valsartan  (DIOVAN) 40 MG tablet   Oral   Take 1 tablet (40 mg total) by mouth daily. As need for systolic blood pressure over 150   30 tablet   3   . cephALEXin (KEFLEX) 500 MG capsule   Oral   Take 1 capsule (500 mg total) by mouth 2 (two) times daily.   14 capsule   0   . nystatin cream (MYCOSTATIN)      Apply to affected area 2 times daily   30 g   0    BP 180/77  Pulse 63  Temp(Src) 98.4 F (36.9 C) (Oral)  Resp 20  Ht 5\' 6"  (1.676 m)  Wt 217 lb (98.431 kg)  BMI 35.04 kg/m2  SpO2 98% Physical Exam  Nursing note and vitals reviewed. Constitutional: She appears well-developed and well-nourished. No distress.  HENT:  Head: Normocephalic and atraumatic.  Eyes: Conjunctivae are normal. No scleral icterus.  Neck: Normal range of motion.  Cardiovascular: Normal rate, regular rhythm and normal heart sounds.   Pulmonary/Chest: Effort normal and breath sounds normal. No respiratory distress. She has no wheezes. She has no rales. She exhibits no tenderness.  Abdominal: Soft. Bowel sounds are normal. She exhibits no distension and no mass. There is no tenderness. There is no rebound and no guarding.  Musculoskeletal: Normal range of motion.  Neurological: She is alert.  Skin: Skin is warm and dry. Rash noted. She is not diaphoretic. There is erythema. No pallor.  Skin grafting to right lower leg and foot. Skin graft on right lower leg-erythematous with papular rash over graft with excoriation as well as papular rash above and below graft. No discharge. No red streaking or warmth    ED Course  Procedures (including critical care time) Labs Review Labs Reviewed  CBC WITH DIFFERENTIAL - Abnormal; Notable for the following:    Hemoglobin 15.1 (*)    RDW 15.7 (*)  All other components within normal limits  BASIC METABOLIC PANEL - Abnormal; Notable for the following:    Glucose, Bld 143 (*)    GFR calc non Af Amer 58 (*)    GFR calc Af Amer 67 (*)    All other components within  normal limits  URINALYSIS, ROUTINE W REFLEX MICROSCOPIC - Abnormal; Notable for the following:    Color, Urine ORANGE (*)    APPearance CLOUDY (*)    Bilirubin Urine SMALL (*)    Ketones, ur 15 (*)    Protein, ur >300 (*)    Nitrite POSITIVE (*)    Leukocytes, UA MODERATE (*)    All other components within normal limits  URINE MICROSCOPIC-ADD ON - Abnormal; Notable for the following:    Bacteria, UA MANY (*)    All other components within normal limits  URINE CULTURE  HEPATIC FUNCTION PANEL   Imaging Review No results found.  EKG Interpretation   None       MDM   Final diagnoses:  Elevated blood pressure  Yeast infection of the skin  UTI (lower urinary tract infection)    Pt with hx of Parkinson's disease with dementia brought in by family member with concern of rash on right lower leg skin graft as well as elevated BP despite taking home BP medication as prescribed.   Pt c/o nausea and itching of right lower leg. No fever, vomiting or diarrhea.  Denies chest pain or SOB. Pt also currently being tx for bladder spasms believed to be due to pt's Parkinson's disease.  Pt appears well, non-toxic. Hemodynamically stable.   CBC and BMP: consistent with previous UA: evidence of UTI with moderate leukocytes, positive for nitrites, WBC-TMTC, Bacteria-many  Discussed pt with Dr. Wilson Singer who also examined right lower leg.  Will start pt on 1g Rocephin as well as discharge home with Keflex and topical nystatin.  No intervention of pt's BP indicated at this time.  Advised pt and family member to have her f/u with PCP later this week. Return precautions provided. Pt and family member verbalized understanding and agreement with tx plan.      Noland Fordyce, PA-C 03/13/13 [place], PA-C 03/13/13 [phone]

## 2013-03-14 ENCOUNTER — Telehealth: Payer: Self-pay | Admitting: Neurology

## 2013-03-14 NOTE — Telephone Encounter (Signed)
Patient's daughter calling to state that there was hold up on the mail order pharmacy for patient's Clonazepam refill, was wondering if a temporary refill could be sent to the [facility] on [address].

## 2013-03-14 NOTE — Telephone Encounter (Signed)
Re-faxed original order to local pharmacy since patient is having issues with mail order.  I called Andrea Dorsey.  She is aware.

## 2013-03-15 ENCOUNTER — Emergency Department (HOSPITAL_COMMUNITY): Payer: Medicare Other

## 2013-03-15 ENCOUNTER — Emergency Department (HOSPITAL_COMMUNITY)
Admission: EM | Admit: 2013-03-15 | Discharge: 2013-03-15 | Disposition: A | Discharge status: Home / Self Care | Payer: Medicare Other | Attending: Emergency Medicine | Admitting: Emergency Medicine

## 2013-03-15 ENCOUNTER — Telehealth: Payer: Self-pay | Admitting: *Deleted

## 2013-03-15 ENCOUNTER — Encounter (HOSPITAL_COMMUNITY): Payer: Self-pay | Admitting: Emergency Medicine

## 2013-03-15 DIAGNOSIS — G20A1 Parkinson's disease without dyskinesia, without mention of fluctuations: Secondary | ICD-10-CM | POA: Insufficient documentation

## 2013-03-15 DIAGNOSIS — M159 Polyosteoarthritis, unspecified: Secondary | ICD-10-CM | POA: Insufficient documentation

## 2013-03-15 DIAGNOSIS — Z872 Personal history of diseases of the skin and subcutaneous tissue: Secondary | ICD-10-CM | POA: Insufficient documentation

## 2013-03-15 DIAGNOSIS — F329 Major depressive disorder, single episode, unspecified: Secondary | ICD-10-CM | POA: Insufficient documentation

## 2013-03-15 DIAGNOSIS — Z79899 Other long term (current) drug therapy: Secondary | ICD-10-CM | POA: Insufficient documentation

## 2013-03-15 DIAGNOSIS — F3289 Other specified depressive episodes: Secondary | ICD-10-CM | POA: Insufficient documentation

## 2013-03-15 DIAGNOSIS — R109 Unspecified abdominal pain: Secondary | ICD-10-CM | POA: Insufficient documentation

## 2013-03-15 DIAGNOSIS — Z792 Long term (current) use of antibiotics: Secondary | ICD-10-CM | POA: Insufficient documentation

## 2013-03-15 DIAGNOSIS — R110 Nausea: Secondary | ICD-10-CM | POA: Insufficient documentation

## 2013-03-15 DIAGNOSIS — Z7982 Long term (current) use of aspirin: Secondary | ICD-10-CM | POA: Insufficient documentation

## 2013-03-15 DIAGNOSIS — R3915 Urgency of urination: Secondary | ICD-10-CM | POA: Insufficient documentation

## 2013-03-15 DIAGNOSIS — I10 Essential (primary) hypertension: Secondary | ICD-10-CM | POA: Insufficient documentation

## 2013-03-15 DIAGNOSIS — G20 Parkinson's disease: Secondary | ICD-10-CM | POA: Insufficient documentation

## 2013-03-15 DIAGNOSIS — K5900 Constipation, unspecified: Secondary | ICD-10-CM | POA: Insufficient documentation

## 2013-03-15 DIAGNOSIS — F0280 Dementia in other diseases classified elsewhere without behavioral disturbance: Secondary | ICD-10-CM | POA: Insufficient documentation

## 2013-03-15 DIAGNOSIS — R350 Frequency of micturition: Secondary | ICD-10-CM | POA: Insufficient documentation

## 2013-03-15 DIAGNOSIS — Z90710 Acquired absence of both cervix and uterus: Secondary | ICD-10-CM | POA: Insufficient documentation

## 2013-03-15 DIAGNOSIS — E039 Hypothyroidism, unspecified: Secondary | ICD-10-CM | POA: Insufficient documentation

## 2013-03-15 DIAGNOSIS — R300 Dysuria: Secondary | ICD-10-CM | POA: Insufficient documentation

## 2013-03-15 LAB — URINE MICROSCOPIC-ADD ON

## 2013-03-15 LAB — URINALYSIS, ROUTINE W REFLEX MICROSCOPIC
Bilirubin Urine: NEGATIVE
Glucose, UA: NEGATIVE mg/dL
Hgb urine dipstick: NEGATIVE
Ketones, ur: NEGATIVE mg/dL
Nitrite: NEGATIVE
Protein, ur: 100 mg/dL — AB
Specific Gravity, Urine: 1.019 (ref 1.005–1.030)
UROBILINOGEN UA: 0.2 mg/dL (ref 0.0–1.0)
pH: 6 (ref 5.0–8.0)

## 2013-03-15 LAB — CBC WITH DIFFERENTIAL/PLATELET
BASOS PCT: 1 % (ref 0–1)
Basophils Absolute: 0 10*3/uL (ref 0.0–0.1)
EOS PCT: 3 % (ref 0–5)
Eosinophils Absolute: 0.2 10*3/uL (ref 0.0–0.7)
HEMATOCRIT: 45.6 % (ref 36.0–46.0)
HEMOGLOBIN: 15.7 g/dL — AB (ref 12.0–15.0)
LYMPHS PCT: 23 % (ref 12–46)
Lymphs Abs: 1.7 10*3/uL (ref 0.7–4.0)
MCH: 29.7 pg (ref 26.0–34.0)
MCHC: 34.4 g/dL (ref 30.0–36.0)
MCV: 86.2 fL (ref 78.0–100.0)
MONO ABS: 0.8 10*3/uL (ref 0.1–1.0)
MONOS PCT: 12 % (ref 3–12)
NEUTROS ABS: 4.4 10*3/uL (ref 1.7–7.7)
Neutrophils Relative %: 62 % (ref 43–77)
Platelets: 222 10*3/uL (ref 150–400)
RBC: 5.29 MIL/uL — ABNORMAL HIGH (ref 3.87–5.11)
RDW: 16 % — ABNORMAL HIGH (ref 11.5–15.5)
WBC: 7.1 10*3/uL (ref 4.0–10.5)

## 2013-03-15 LAB — COMPREHENSIVE METABOLIC PANEL
ALT: <5 U/L (ref 0–35)
AST: 16 U/L (ref 0–37)
Albumin: 3.5 g/dL (ref 3.5–5.2)
Alkaline Phosphatase: 93 U/L (ref 39–117)
BUN: 16 mg/dL (ref 6–23)
CO2: 23 meq/L (ref 19–32)
CREATININE: 0.96 mg/dL (ref 0.50–1.10)
Calcium: 10 mg/dL (ref 8.4–10.5)
Chloride: 103 mEq/L (ref 96–112)
GFR, EST AFRICAN AMERICAN: 62 mL/min — AB (ref >90)
GFR, EST NON AFRICAN AMERICAN: 54 mL/min — AB (ref >90)
Glucose, Bld: 111 mg/dL — ABNORMAL HIGH (ref 70–99)
Potassium: 4.6 mEq/L (ref 3.7–5.3)
Sodium: 141 mEq/L (ref 137–147)
Total Bilirubin: 0.8 mg/dL (ref 0.3–1.2)
Total Protein: 7 g/dL (ref 6.0–8.3)

## 2013-03-15 MED ORDER — SODIUM CHLORIDE 0.9 % IV BOLUS (SEPSIS)
1000.0000 mL | Freq: Once | INTRAVENOUS | Status: AC
  Administered 2013-03-15: 1000 mL via INTRAVENOUS

## 2013-03-15 MED ORDER — MORPHINE SULFATE 4 MG/ML IJ SOLN
2.0000 mg | Freq: Once | INTRAMUSCULAR | Status: AC
  Administered 2013-03-15: 2 mg via INTRAVENOUS
  Filled 2013-03-15: qty 1

## 2013-03-15 MED ORDER — ONDANSETRON HCL 4 MG/2ML IJ SOLN
4.0000 mg | Freq: Once | INTRAMUSCULAR | Status: AC
  Administered 2013-03-15: 4 mg via INTRAVENOUS
  Filled 2013-03-15: qty 2

## 2013-03-15 NOTE — ED Notes (Signed)
Family at bedside. 

## 2013-03-15 NOTE — ED Provider Notes (Signed)
Care assumed from [facility], PA-C    Andrea Dorsey is a 80 y.o. female pt presents with HTN (mostly diastolic) that is being treated by the PCP with a plan to deal with this.  Also with 1 week of lower abd pain and dysuria, nausea.  Constipation at baseline.  Mild tenderness on abd exam with otherwise soft abd.  Plan: cath urine and CT abd pelvis pending.  Pt to be d/c if all else is negative.  Pt can be treated as OP if UTI present.    Face to face Exam:   General: Awake  HEENT: Atraumatic  Resp: Normal effort  Cardiac: RRR Abd: Nondistended, soft and nontender (after morphine)  Neuro:No focal weakness  Lymph: No adenopathy  4:49 PM  UA with evidence of clearing UTI as she is now nitrite negative and only 0-2 WBCs as compared to the sample on 03/13/13.  Pt without leukocytosis and does not meet the SIRS or sepsis criteria.  CMP reassuring.  CT with probable sludge in the gallbladder and small umbilical hernia, but no other concerning abd findings.    I personally reviewed the imaging tests through PACS system.   I reviewed available ER/hospitalization records through the EMR.    Record review shows that PCP recommended a bladder scan to assess for neurogenic bladder 2/2 to her parkinson's disease.  The daughter has declined the test at this time.    Pt continues to rest comfortably at this time and remains without continued abd pain.  Pt's daughter continues to express concern over her mother's elevated BPs.  Further record review indicates that PCP has been reluctant to increased BP medications due to some concern for morning orthostasis and dizziness.  Last BP on my assessment was 177/65.  Pt without evidence of elevated diastolic numbers throughout her stay here in the ED.  I discussed with the daughter the need to have BP medications changed and closely monitored by the PCP.    It has been determined that no acute conditions requiring further emergency intervention are  present at this time. The patient/guardian have been advised of the diagnosis and plan. We have discussed signs and symptoms that warrant return to the ED, such as changes or worsening in symptoms.   Vital signs are stable at discharge.   BP 199/65  Pulse 70  Temp(Src) 98.7 F (37.1 C) (Oral)  Resp 14  Wt 215 lb (97.523 kg)  SpO2 95%  Patient/guardian has voiced understanding and agreed to follow-up with the PCP or specialist.           Abigail Butts, PA-C 03/15/13 1710

## 2013-03-15 NOTE — ED Provider Notes (Signed)
Medical screening examination/treatment/procedure(s) were performed by non-physician practitioner and as supervising physician I was immediately available for consultation/collaboration.  EKG Interpretation   None        Threasa Beards, MD 03/15/13 [phone]

## 2013-03-15 NOTE — ED Provider Notes (Signed)
CSN: [id number]     Arrival date & time 03/15/13  1229 History   First MD Initiated Contact with Patient 03/15/13 1307     Chief Complaint  Patient presents with  . Hypertension     (Consider location/radiation/quality/duration/timing/severity/associated sxs/prior Treatment) HPI Comments: Patient is an 80 yo M PMHx significant for HTN, Hypothyroidism, Parkinson Disease, Spinal stenosis, hx of necrotizing fasciitis s/p graft placement, dementia, depression presenting to the ED for two complaints. Patient's first complaint is mild to moderate lower abdominal pain that began eight days ago with associated nausea and constipation (but without change from baseline). Patient is unable to qualify her pain. She endorses associated dysuria, frequency, and urgency. No alleviating or aggravating factors. Patient's second complaint is hypertension. The family member is concerned for persistent elevated BP. Family member reports pressure today was 180s-200s/90s.  Reports pt takes lisinopril and metoprolol daily and adds Diovan when systolic pressure >403. The family has been discussing this with the PCP at several visits. Denies any fevers, chills, emesis, diarrhea, CP, SOB, edema. Abdominal surgical history includes abdominal hysterectomy.   The history is provided by the patient and a relative.    Past Medical History  Diagnosis Date  . Benign essential hypertension   . Hypothyroidism   . Osteoarthritis, generalized   . Parkinson disease   . Spinal stenosis   . History of necrotizing fascIItis     left leg, s/p debridement and graft  . Dementia in Parkinson's disease   . Depression    Past Surgical History  Procedure Laterality Date  . Skin debridement  2014    Brambhelt, MD  . Skin graft  2014    Brambhelt MD  . Spine surgery  2006    spinal stenosis  . Abdominal hysterectomy  1977   Family History  Problem Relation Age of Onset  . Heart disease Mother   . Heart disease Sister   .  Hypertension Sister   . Stroke Sister   . Heart disease Sister     heart attack  . Cancer Sister     colon   History  Substance Use Topics  . Smoking status: Never Smoker   . Smokeless tobacco: Not on file  . Alcohol Use: No   OB History   Grav Para Term Preterm Abortions TAB SAB Ect Mult Living                 Review of Systems  Constitutional: Negative for fever and chills.  Respiratory: Negative for shortness of breath.   Cardiovascular: Negative for chest pain.  Gastrointestinal: Positive for nausea, abdominal pain and constipation. Negative for vomiting and diarrhea.  Genitourinary: Positive for dysuria, urgency and frequency.  All other systems reviewed and are negative.      Allergies  Ivp dye and Clindamycin  Home Medications   Current Outpatient Rx  Name  Route  Sig  Dispense  Refill  . acetaminophen (TYLENOL) 500 MG tablet   Oral   Take 500 mg by mouth every 6 (six) hours as needed for mild pain.          Marland Kitchen aspirin 81 MG EC tablet   Oral   Take 1 tablet (81 mg total) by mouth daily.   30 tablet   0   . carbidopa-levodopa-entacapone (STALEVO) 37.5-150-200 MG per tablet   Oral   Take 1 tablet by mouth 5 (five) times daily. At 8 AM, 11 AM, 2 PM, 5 PM, and 8 PM         .  cephALEXin (KEFLEX) 500 MG capsule   Oral   Take 1 capsule (500 mg total) by mouth 2 (two) times daily.   14 capsule   0   . Cholecalciferol (VITAMIN D3) 2000 UNITS TABS   Oral   Take 2,000 Units by mouth daily.          . clonazePAM (KLONOPIN) 0.5 MG tablet   Oral   Take 0.5 mg by mouth at bedtime.         . diclofenac sodium (VOLTAREN) 1 % GEL   Topical   Apply 4 g topically 2 (two) times daily. for knee pain         . furosemide (LASIX) 20 MG tablet   Oral   Take 20 mg by mouth every other day.         . levothyroxine (SYNTHROID, LEVOTHROID) 25 MCG tablet   Oral   Take 1 tablet (25 mcg total) by mouth daily before breakfast.   90 tablet   3   .  lisinopril (PRINIVIL,ZESTRIL) 20 MG tablet   Oral   Take 1 tablet (20 mg total) by mouth daily.   90 tablet   3   . metoprolol succinate (TOPROL-XL) 25 MG 24 hr tablet   Oral   Take 50 mg by mouth daily.         Marland Kitchen nystatin cream (MYCOSTATIN)      Apply to affected area 2 times daily   30 g   0   . potassium chloride SA (K-DUR,KLOR-CON) 20 MEQ tablet   Oral   Take 1 tablet (20 mEq total) by mouth daily.   30 tablet   0   . ranitidine (ZANTAC) 150 MG tablet   Oral   Take 150 mg by mouth as needed for heartburn. Take 1 tablet by mouth as needed for acid reflux.         . senna (SENOKOT) 8.6 MG tablet   Oral   Take 1 tablet by mouth daily as needed for constipation.          . traMADol (ULTRAM) 50 MG tablet   Oral   Take 1 tablet (50 mg total) by mouth every 8 (eight) hours as needed.   90 tablet   0   . valsartan (DIOVAN) 40 MG tablet   Oral   Take 1 tablet (40 mg total) by mouth daily. As need for systolic blood pressure over 150   30 tablet   3    BP 135/65  Pulse 65  Temp(Src) 98.7 F (37.1 C) (Oral)  Resp 15  Wt 215 lb (97.523 kg)  SpO2 95% Physical Exam  Nursing note and vitals reviewed. Constitutional: She is oriented to person, place, and time. She appears well-developed and well-nourished. No distress.  HENT:  Head: Normocephalic and atraumatic.  Right Ear: External ear normal.  Left Ear: External ear normal.  Nose: Nose normal.  Mouth/Throat: Oropharynx is clear and moist. No oropharyngeal exudate.  Eyes: Conjunctivae are normal.  Neck: Neck supple.  Cardiovascular: Normal rate, regular rhythm and normal heart sounds.   Pulmonary/Chest: Effort normal and breath sounds normal. No respiratory distress.  Abdominal: Soft. Bowel sounds are normal. She exhibits no distension. There is tenderness in the suprapubic area. There is no rigidity, no rebound, no guarding and no CVA tenderness.  Musculoskeletal: Normal range of motion. She exhibits no  edema.  Lymphadenopathy:    She has no cervical adenopathy.  Neurological: She is alert and oriented to person,  place, and time.  Skin: Skin is warm and dry. No rash noted. She is not diaphoretic.     Skin changes noted to area marked d/t skin graft. No warmth. Mild erythema (on Keflex for cellulitis) improved from previous note.     ED Course  Procedures (including critical care time) Medications  sodium chloride 0.9 % bolus 1,000 mL (1,000 mLs Intravenous New Bag/Given 03/15/13 1455)  ondansetron (ZOFRAN) injection 4 mg (4 mg Intravenous Given 03/15/13 1455)  morphine 4 MG/ML injection 2 mg (2 mg Intravenous Given 03/15/13 1455)    Labs Review Labs Reviewed  CBC WITH DIFFERENTIAL - Abnormal; Notable for the following:    RBC 5.29 (*)    Hemoglobin 15.7 (*)    RDW 16.0 (*)    All other components within normal limits  URINE CULTURE  COMPREHENSIVE METABOLIC PANEL  URINALYSIS, ROUTINE W REFLEX MICROSCOPIC   Imaging Review No results found.  EKG Interpretation   None       MDM   Final diagnoses:  Abdominal pain    Filed Vitals:   03/15/13 1443  BP: 135/65  Pulse: 65  Temp: 98.7 F (37.1 C)  Resp: 15   Afebrile, NAD, non-toxic appearing, AAOx4.   1)HTN: patient with mildly elevated BP, stable from previous ED visits. Pt's family has had multiple visits to PCP regarding this issue. Discussed with family that patient will not need emergent BP lowering in ED.   2) Abdominal pain: Abdomen soft, tender in suprapubic region, no distention. BS normal. No guarding, rigidity or rebound. Labs ordered, No leukocytosis. CMP and UA pending. CT scan pending.   Patient will be signed out to [facility], PA-C pending CT scan with hopeful discharge pending any abnormalities or other concerns with further course in ED. Patient d/w with Dr. Canary Brim, agrees with plan.       Harlow Mares, PA-C 03/15/13 1558

## 2013-03-15 NOTE — Telephone Encounter (Signed)
Andrea Dorsey, daughter, called and stated that her mother is going back to the ER with her BP   204/105. Daughter is upset and wants something adjusted. Daughter would like for you to call her directly. States that she is concerned with her mother in and out of the ER for this. Please advise.

## 2013-03-15 NOTE — ED Notes (Addendum)
Pt presents to department via [facility] from [facility] for evaluation of hypertension and lower abdominal pain. Was seen recently for same. BP 200/106 this morning. Pt also states lower abdominal pain, 5/10 upon arrival. Facility staff also reports that patient is lethargic and sleepy today. 20g L forearm. Pt is lethargic upon arrival.

## 2013-03-15 NOTE — Discharge Instructions (Signed)
1. Medications: usual home medications; continue Keflex until completed 2. Treatment: rest, drink plenty of fluids,  3. Follow Up: Please followup with your primary doctor for discussion of your diagnoses and further evaluation after today's visit;   Neurogenic Bladder Neurogenic bladder is a loss of normal control of bladder function. This is caused by damaged nerves, which can be a result of a variety of injuries and diseases.  The muscles and nerves of the urinary system work together to store urine and release urine at the right time. Nerves carry messages from the bladder to the brain and from the brain to the muscles of the bladder. In a neurogenic bladder, the nerves that are supposed to carry these messages do not work properly.There are 2 types of neurogenic bladder:  Overactive.  The bladder is unable to control when or how much to urinate. Even when only a small amount of urine is in the bladder, there may be an urge to urinate that cannot be controlled. This can result in wetting accidents (urge incontinence).  Underactive. The bladder holds much more urine than normal. Small amounts of urine leak out as bladder pressure builds because there is not a sensation that the bladder is full. This can also result in wetting accidents. CAUSES  Neurogenic bladder can be caused by many problems. These include:   Stroke.  Multiple sclerosis.  Infection.  Trauma and injuries to the spine, spinal cord, depending on the level in the back of the injury.  Diabetes.  Parkinson's Disease.  Brain and spinal cord tumors.  Birth defects that affect the brain or spinal cord.  Guillain-Barr syndrome.  Complications of surgery involving the spine, spinal cord, or pelvis. SYMPTOMS  The following are the most common problems and symptoms of neurogenic bladder. However, each individual may experience symptoms differently.   Urinary tract infection symptoms:  Pain or burning when  urinating.  Back or flank pain.  Cloudy or bloody urine.  Fever.  Kidney stone symptoms. Symptoms of kidney stones include:  Chills.  Shivering.  Feeling sick to your stomach (nausea) and/or vomiting.  Fever.  Loss of bladder control (urinary incontinence).  Small urine volume when emptying bladder (voiding).  Urinary frequency and urgency.  Dribbling urine.  Loss of sensation of bladder fullness.  Kidney failure.  Infection in the blood stream (sepsis). DIAGNOSIS  When neurogenic bladder is suspected, both the nervous system and the bladder are examined. In addition to reviewing your complete medical history and having a physical exam, diagnostic procedures for neurogenic bladder may include:  X-rays of the spine.  Imaging tests of the kidneys, ureters, and bladder. These tests may include:  MRI.  CT scan.  Ultrasound.  Urodynamics or Cystometrogram (CMG). Thistests the nerves and muscles of the bladder. The test can show how much the bladder can hold and if it empties completely.  EMG of the sphincter . This is measure of the electrical activity of the sphincter muscle and this helps determine when the sphincter is contracting or squeezing down.  Video studies of the bladder and sphincter while you are urinating.  Cystoscopy . Looking inside the bladder with a telescope like instrument. TREATMENT  Specific treatment for neurogenic bladder will be determined by your caregiver based on:  Your age, overall health, and medical history.  Severity of symptoms.  Cause of the nerve damage.  Type of bladder problem present.  Your tolerance for specific medications, procedures, or therapies.  Expectations for the course of the condition.  Your opinion or preference. Treatment may include:  Antibiotic medicine.  Medications.  Insertion of a flexible tube (catheter) to empty the bladder.  Surgery to create an artificial sphincterto prevent urinary  leakage.  Sacral nerve stimulation (SNS) to help stimulate the nerves in order to empty the bladder.  Sling surgery to hold the neck of the bladder and urethra in the proper position to prevent leakage.  Bladder augmentation.  Ileal loop surgery to connect a section of intestine to the ureters and positioned out to a opening on the abdomen. Urine comes out and can be collected into a plastic bag.  Perineal pads may be used to catch urine. HOME CARE INSTRUCTIONS   Take all medications as prescribed by your caregiver.  Review your medications (both prescription and non-prescription) with your caregiver to make sure medications you are presently taking will not be harmful.  Periodic blood, urine, and imaging tests may be required. Follow your caregiver's advice regarding the timing of these.  Follow the catheter care instructions if you use a catheter.  Use disposiable underwear if you have bladder weakness. SEEK MEDICAL CARE IF:   You have increasing fatigue or weakness.  You find there is less and less control of the urine stream despite treatment.  You develop a loss of appetite or nausea.  You begin to have trouble inserting the catheter.  Your urine appears somewhat dark, cloudy, or has an unusual smell. SEEK IMMEDIATE MEDICAL CARE IF:   You develop worsening abdominal or back pain.  You cannot pass any urine.  Your urine becomes bloody.  You experience a lot of burning while urinating.  You have a fever.  You keep vomiting.  You have bleeding with catheter insertion. Document Released: 07/20/2006 Document Revised: 12/24/2011 Document Reviewed: 11/03/2008 [facility] Patient Information 2014 [place].

## 2013-03-16 ENCOUNTER — Other Ambulatory Visit: Payer: Self-pay | Admitting: *Deleted

## 2013-03-16 MED ORDER — POTASSIUM CHLORIDE CRYS ER 20 MEQ PO TBCR: 20.0000 meq | EXTENDED_RELEASE_TABLET | Freq: Every day | ORAL | Status: DC

## 2013-03-17 NOTE — ED Provider Notes (Signed)
Medical screening examination/treatment/procedure(s) were conducted as a shared visit with non-physician practitioner(s) and myself.  I personally evaluated the patient during the encounter.  EKG Interpretation   None      80 year old female with pruritic rash the site of prior skin grafting. Suspect this is actually a fungal infection. Graft is intensely erythematous. She has scattered macular/papular lesions extending along the periphery of it. Suspect these were satellite lesions. Her symptoms of burning and itching a more consistent with a fungal process than bacterial cellulitis. She is not tender over this area and there is no increased warmth as would be suspected with cellulitis as well. Topical antifungals at this time. Will get LFTs as a baseline in case needs further systemic treatment. She will be started on Keflex for urinary tract infection. This would potentially cover cellulitis, although again I suspect that it is currently not superinfected. Daughter is very worried about the patient's hypertension. She is hypertensive but not symptomatic in regards to this. Discussed long term versus emergent blood pressure management. Tried to address her concerns. Discussed that further management of her blood pressure medications more appropriate for her PCP at this time.  Virgel Manifold, MD 03/17/13 [phone]

## 2013-03-18 ENCOUNTER — Telehealth: Payer: Self-pay | Admitting: Neurology

## 2013-03-18 NOTE — Telephone Encounter (Signed)
Patient's daughter Andrea Dorsey called to state that patient has been having episodes of lethargy and being out of it in the mornings for the past couple of weeks. Andrea Dorsey states that patient has not been able to speak in the morning and is wondering if an adjustment to her Parkinson's medication should be made. Andrea Dorsey is also wondering if patient can be seen sooner than her scheduled June appointment. Please call and advise.

## 2013-03-19 ENCOUNTER — Telehealth (HOSPITAL_COMMUNITY): Payer: Self-pay | Admitting: *Deleted

## 2013-03-19 LAB — URINE CULTURE: Colony Count: >=100000

## 2013-03-19 NOTE — ED Notes (Signed)
Rise Mu calling to notify [facility] of Urine with >100,000 colonies of VRE.  Chart sent to EDP's office for review.

## 2013-03-20 NOTE — ED Provider Notes (Signed)
Medical screening examination/treatment/procedure(s) were performed by non-physician practitioner and as supervising physician I was immediately available for consultation/collaboration.    Somya Jauregui L Elo Marmolejos, MD 03/20/13 0815 

## 2013-03-20 NOTE — Progress Notes (Signed)
ED Antimicrobial Stewardship Positive Culture Follow Up   Andrea Dorsey is an 80 y.o. female who presented to [facility] on 03/15/2013 with a chief complaint of  Chief Complaint  Patient presents with  . Hypertension    Recent Results (from the past 720 hour(s))  URINE CULTURE     Status: Abnormal   Collection Time    03/10/13 11:10 AM      Result Value Ref Range Status   Urine Culture, Routine Final report (*)  Final   Result 1 Proteus mirabilis (*)  Final   Comment: Greater than 100,000 colony forming units per mL   ANTIMICROBIAL SUSCEPTIBILITY Comment   Final   Comment:   S = Susceptible; I = Intermediate; R = Resistant                         P = Positive; N = Negative                 MICS are expressed in micrograms per mL        Antibiotic                 RSLT#1    RSLT#2    RSLT#3    RSLT#4     Amoxicillin/Clavulanic Acid    S     Ampicillin                     R     Cefepime                       S     Ceftriaxone                    S     Cefuroxime                     S     Cephalothin                    S     Ciprofloxacin                  R     Ertapenem                      S     Gentamicin                     I     Levofloxacin                   R     Nitrofurantoin                 R     Piperacillin                   S     Tetracycline                   R     Tobramycin                     I     Trimethoprim/Sulfa             S  URINE CULTURE     Status: None   Collection Time    03/15/13  3:38 PM      Result Value  Ref Range Status   Specimen Description URINE, CLEAN CATCH   Final   Special Requests PT ON [facility]   Final   Culture  Setup Time     Final   Value: 03/15/2013 16:33     Performed at [facility]     Final   Value: >=100,000 COLONIES/ML     Performed at [facility]   Culture     Final   Value: VANCOMYCIN RESISTANT ENTEROCOCCUS ISOLATED     Note: CRITICAL RESULT CALLED TO, READ BACK BY AND VERIFIED WITH: Alanson Aly on 03/19/13 at 19:30 by Rise Mu REPORT FAXED BY REQUEST     Performed at [facility]   Report Status 03/19/2013 FINAL   Final   Organism ID, Bacteria VANCOMYCIN RESISTANT ENTEROCOCCUS ISOLATED   Final    []  Treated with, organism resistant to prescribed antimicrobial [x]  Patient discharged originally without antimicrobial agent and treatment is now indicated. Pt was on keflex prior to ED visit for cellulitis. Pt was symptomatic with UTI symptoms per notes but was afebrile and WBC was WNL.   New antibiotic prescription: minocycline 100mg  PO BID x 10 days  ED Provider: Domenic Moras   Dickie Labarre, Rande Lawman 03/20/2013, 2:34 PM Clinical Pharmacist Phone# [phone]

## 2013-03-20 NOTE — ED Provider Notes (Signed)
Laboratory followup-was presented urine culture results from 03/15/2013 showing VRE urine infection. Patient is apparently on Keflex and Rocephin IM, but indication of documentation. She apparently contacted her PCP on 03/18/2013, about weakness. This could indicate worsening clinical status.  The patient will need to be contacted and seen today if she is ill. If she is well, she can be seen tomorrow by her PCP. This would be a mandatory followup, either today or tomorrow. She may require an emergency visit today, in the emergency department . Since this is a single positive urine culture, it may be a contaminant, since it was a clean catch urine.   I will ask the emergency department staff to contact the patient and her caregivers.      Richarda Blade, MD 03/20/13 1136

## 2013-03-20 NOTE — ED Notes (Signed)
Post ED Visit - Positive Culture Follow-up: Successful Patient Follow-Up  Culture assessed and recommendations reviewed by: []  Wes Dulaney, Pharm.D., BCPS []  Heide Guile, Pharm.D., BCPS []  Alycia Rossetti, Pharm.D., BCPS []  Anderson, Pharm.D., BCPS, AAHIVP []  Legrand Como, Pharm.D., BCPS, AAHIVP [x]  Salome Arnt, Pharm.D., BCPS  Positive urine culture  [x]  Patient discharged without antimicrobial prescription and treatment is now indicated []  Organism is resistant to prescribed ED discharge antimicrobial []  Patient with positive blood cultures  Changes discussed with ED provider: Domenic Moras PA-C New antibiotic prescription: Minocycline 100 mg PO BID x 10 days    Juri Dinning 03/20/2013, 2:46 PM

## 2013-03-21 NOTE — ED Notes (Signed)
See EPIC note written by Dr Eulis Foster.

## 2013-03-21 NOTE — ED Notes (Signed)
Call placed to [facility] @ [place]-Copy of EPIC note faxed to facility because current scriber was unable to talk to nurse.I talked to daughter POA and she will follow up with facility,patient has appointment  With PCP on Wednesday per daughter.

## 2013-03-21 NOTE — Telephone Encounter (Signed)
I called and LMVM for Andrea Dorsey, if still needed to call back.   Looks like pt being treated for UTI.

## 2013-03-22 ENCOUNTER — Ambulatory Visit: Payer: Medicare Other | Admitting: Internal Medicine

## 2013-03-22 NOTE — Telephone Encounter (Signed)
I called and spoke with [name], daughter.   Pt has had confusion, memory issues recently.   Has been on ABX for UTI for a week.  Has had no other sx, other then weakness.  She has an appt with NP/pcp tomorrow in the pm.  I offered tomorrow am appt with Dr. Danae Orleans.  Daughter will keep pcp appt and from there will call back re: if needs sooner appt.  I instructed that they may do repeat urinalysis, labs.  She verbalized understanding.

## 2013-03-23 ENCOUNTER — Encounter: Payer: Self-pay | Admitting: Nurse Practitioner

## 2013-03-23 ENCOUNTER — Ambulatory Visit (INDEPENDENT_AMBULATORY_CARE_PROVIDER_SITE_OTHER): Payer: Medicare Other | Admitting: Nurse Practitioner

## 2013-03-23 VITALS — BP 156/82 | HR 66 | Temp 98.7°F | Resp 10

## 2013-03-23 DIAGNOSIS — N390 Urinary tract infection, site not specified: Secondary | ICD-10-CM

## 2013-03-23 DIAGNOSIS — I10 Essential (primary) hypertension: Secondary | ICD-10-CM

## 2013-03-23 LAB — POCT URINALYSIS DIPSTICK
BILIRUBIN UA: NEGATIVE
Glucose, UA: NEGATIVE
Ketones, UA: NEGATIVE
Leukocytes, UA: NEGATIVE
NITRITE UA: NEGATIVE
PH UA: 6
Protein, UA: 2000
Spec Grav, UA: >=1.03
Urobilinogen, UA: 0.2

## 2013-03-23 MED ORDER — DOXYCYCLINE HYCLATE 100 MG PO TABS: 100.0000 mg | ORAL_TABLET | Freq: Two times a day (BID) | ORAL | Status: DC

## 2013-03-23 MED ORDER — VALSARTAN 80 MG PO TABS
80.0000 mg | ORAL_TABLET | Freq: Every day | ORAL | Status: DC
Start: 2013-03-23 — End: 2013-03-24

## 2013-03-23 NOTE — Progress Notes (Signed)
Patient ID: Andrea Dorsey, female   DOB: Sep 01, 1929, 80 y.o.   MRN: [id number]    Allergies  Allergen Reactions  . Ivp Dye [Iodinated Diagnostic Agents]     Only when intravenous, not on external skin.  . Clindamycin Rash    Unclear whether patient has an actual allergy to clindamycin. On beta-lactam at the same time.    Chief Complaint  Patient presents with  . Follow-up    Per patient's daughter- F/U requested   . Hypertension    Elevated b/- hospitalized x 2 fot HTN   . Dizziness    Ongoing dizziness  . Memory Loss    Patient with increase in memory loss x couple days   . Urinary Tract Infection    Patient was recently treated in the hospital for UTI, patient needs to be rechecked     HPI: Patient is a 80 y.o. female seen in the office today for multiple concerns per daughter.  Has gone to the ED twice recently First time was due to elevation in blood pressure and was found to have UTI; then she went back due to ongoing elevation in blood pressure and non-responsive.  Was previously on Diovan and hctz and blood pressure was well controlled; somehow she was taken off of this and started on lisinopril -- blood pressure as not be controlled since-- ED doctor referred pt back to PCP for further evaluation and treatment.  Daughter reports pt finished keflex but someone from the ED called daughter and reported she needed another antibiotic due to the fact keflex did not cover organism that grew out but the daughter said nothing was called in. Pt afebrile, denies dysuria but reports pressure in lower abdomen.    Review of Systems:  Review of Systems  Constitutional: Positive for malaise/fatigue. Negative for fever and chills.  Respiratory: Positive for cough. Negative for shortness of breath.   Cardiovascular: Negative for chest pain and palpitations.  Gastrointestinal: Positive for nausea (since on antibiotic ). Negative for abdominal pain, diarrhea and constipation.    Genitourinary: Positive for frequency. Negative for dysuria and urgency.  Neurological: Positive for dizziness, weakness and headaches.  Psychiatric/Behavioral: Positive for memory loss (worse confusion).     Past Medical History  Diagnosis Date  . Benign essential hypertension   . Hypothyroidism   . Osteoarthritis, generalized   . Parkinson disease   . Spinal stenosis   . History of necrotizing fascIItis     left leg, s/p debridement and graft  . Dementia in Parkinson's disease   . Depression    Past Surgical History  Procedure Laterality Date  . Skin debridement  2014    Brambhelt, MD  . Skin graft  2014    Brambhelt MD  . Spine surgery  2006    spinal stenosis  . Abdominal hysterectomy  1977   Social History:   reports that she has never smoked. She does not have any smokeless tobacco history on file. She reports that she does not drink alcohol or use illicit drugs.  Family History  Problem Relation Age of Onset  . Heart disease Mother   . Heart disease Sister   . Hypertension Sister   . Stroke Sister   . Heart disease Sister     heart attack  . Cancer Sister     colon    Medications: Patient's Medications  New Prescriptions   No medications on file  Previous Medications   ACETAMINOPHEN (TYLENOL) 500 MG TABLET  Take 500 mg by mouth every 6 (six) hours as needed for mild pain.    ASPIRIN 81 MG EC TABLET    Take 1 tablet (81 mg total) by mouth daily.   CARBIDOPA-LEVODOPA-ENTACAPONE (STALEVO) 37.5-150-200 MG PER TABLET    Take 1 tablet by mouth 5 (five) times daily. At 8 AM, 11 AM, 2 PM, 5 PM, and 8 PM   CHOLECALCIFEROL (VITAMIN D3) 2000 UNITS TABS    Take 2,000 Units by mouth daily.    CLONAZEPAM (KLONOPIN) 0.5 MG TABLET    Take 0.5 mg by mouth at bedtime.   DICLOFENAC SODIUM (VOLTAREN) 1 % GEL    Apply 4 g topically 2 (two) times daily. for knee pain   FUROSEMIDE (LASIX) 20 MG TABLET    Take 20 mg by mouth every other day.   LEVOTHYROXINE (SYNTHROID,  LEVOTHROID) 25 MCG TABLET    Take 1 tablet (25 mcg total) by mouth daily before breakfast.   LISINOPRIL (PRINIVIL,ZESTRIL) 20 MG TABLET    Take 1 tablet (20 mg total) by mouth daily.   METOPROLOL SUCCINATE (TOPROL-XL) 25 MG 24 HR TABLET    Take 50 mg by mouth daily.   NYSTATIN CREAM (MYCOSTATIN)    Apply to affected area 2 times daily   POTASSIUM CHLORIDE SA (K-DUR,KLOR-CON) 20 MEQ TABLET    Take 1 tablet (20 mEq total) by mouth daily.   RANITIDINE (ZANTAC) 150 MG TABLET    Take 150 mg by mouth as needed for heartburn. Take 1 tablet by mouth as needed for acid reflux.   SENNA (SENOKOT) 8.6 MG TABLET    Take 1 tablet by mouth daily as needed for constipation.    TRAMADOL (ULTRAM) 50 MG TABLET    Take 1 tablet (50 mg total) by mouth every 8 (eight) hours as needed.  Modified Medications   Modified Medication Previous Medication   VALSARTAN (DIOVAN) 40 MG TABLET valsartan (DIOVAN) 40 MG tablet      Take 40 mg by mouth daily.    Take 1 tablet (40 mg total) by mouth daily. As need for systolic blood pressure over 150  Discontinued Medications   CEPHALEXIN (KEFLEX) 500 MG CAPSULE    Take 1 capsule (500 mg total) by mouth 2 (two) times daily.     Physical Exam:  Filed Vitals:   03/23/13 1547  BP: 156/82  Pulse: 66  Temp: 98.7 F (37.1 C)  TempSrc: Oral  Resp: 10  SpO2: 97%    Physical Exam  Constitutional: She is well-developed, well-nourished, and in no distress.  HENT:  Head: Normocephalic and atraumatic.  Cardiovascular: Normal rate, regular rhythm and normal heart sounds.   Pulmonary/Chest: Effort normal and breath sounds normal.  Abdominal: Soft. Bowel sounds are normal. She exhibits no distension. There is no tenderness.  Musculoskeletal: She exhibits no edema and no tenderness.  Neurological: She is alert.  Skin: Skin is warm and dry.     Labs reviewed: Basic Metabolic Panel:  Recent Labs  10/07/12 1032  11/08/12 1940  12/20/12 1429 03/13/13 1105 03/15/13 1524  NA  142  < >  --   < > 142 140 141  K 4.2  < >  --   < > 4.3 4.9 4.6  CL 104  < >  --   < > 103 104 103  CO2 22  < >  --   < > 25 24 23   GLUCOSE 105*  < >  --   < > 114* 143*  111*  BUN 12  < >  --   < > 15 16 16   CREATININE 1.12*  < > 1.05  < > 1.23* 0.90 0.96  CALCIUM 9.9  < >  --   < > 10.2 10.1 10.0  TSH 3.700  --  4.929*  --   --   --   --   < > = values in this interval not displayed. Liver Function Tests:  Recent Labs  10/07/12 1032 03/13/13 1105 03/15/13 1524  AST 15 21 16   ALT 7 5 <5  ALKPHOS 91 99 93  BILITOT 0.9 0.9 0.8  PROT 6.2 7.3 7.0  ALBUMIN  --  3.6 3.5   No results found for this basename: LIPASE, AMYLASE,  in the last 8760 hours No results found for this basename: AMMONIA,  in the last 8760 hours CBC:  Recent Labs  11/11/12 0403 03/13/13 1100 03/15/13 1524  WBC 5.4 6.5 7.1  NEUTROABS  --  4.4 4.4  HGB 11.1* 15.1* 15.7*  HCT 33.2* 43.7 45.6  MCV 90.7 85.7 86.2  PLT 196 201 222   Lipid Panel:  Recent Labs  10/07/12 1032  HDL 59  LDLCALC 133*  TRIG 74  CHOLHDL 3.5   TSH:  Recent Labs  10/07/12 1032 11/08/12 1940  TSH 3.700 4.929*   A1C: No components found with this basename: A1C,    Assessment/Plan 1. Essential hypertension, benign -blood pressure log reveals sbp in the 180s,160s and elevated today -daughter reports dry cough since on lisinopril as well as blood pressure not being as well controlled; will stop lisinopril and increase Diovan; expect this will probably need to be increased to 160 in the future but will start with 80mg  daily at this time.  - valsartan (DIOVAN) 80 MG tablet; Take 1 tablet (80 mg total) by mouth daily.  Dispense: 30 tablet; Refill: 0  2. UTI (urinary tract infection) -culture from hospital reveals sensitivity to doxycyline; will send Rx over for this for 10 days with florastor BID for 1 month -to take with food to avoid GI complaints - doxycycline (VIBRA-TABS) 100 MG tablet; Take 1 tablet (100 mg total) by  mouth 2 (two) times daily.  Dispense: 20 tablet; Refill: 0 -will follow up urinalysis dipstick and Urine culture -to increase hydration and proper nutrition  -to keep follow up with Dr [name], call before if needed

## 2013-03-23 NOTE — Patient Instructions (Signed)
STOP lisinopril Increase Diovan to 80 mg daily (2 of the 40 mg tablets) -- record bp daily and bring to follow up visit with Dr [name]

## 2013-03-24 ENCOUNTER — Other Ambulatory Visit: Payer: Self-pay | Admitting: *Deleted

## 2013-03-24 DIAGNOSIS — I10 Essential (primary) hypertension: Secondary | ICD-10-CM

## 2013-03-24 MED ORDER — VALSARTAN 80 MG PO TABS: ORAL_TABLET | ORAL | Status: DC

## 2013-03-24 NOTE — Telephone Encounter (Signed)
Patient requested to be faxed to mail order. 

## 2013-03-25 LAB — URINE CULTURE

## 2013-03-27 NOTE — Telephone Encounter (Signed)
Diovan was now scheduled and increased the bp checks to twice daily with instructions for me to be informed if sbp > 160 and DBP over 100 on 3 occasions.  If this remains a problem, she will need another appointment to change her regimen altogether.  She also has had difficulty with orthostatic hypotension in the mornings in the past which has made me hesitant to increase her medications too much.

## 2013-03-28 ENCOUNTER — Other Ambulatory Visit: Payer: Self-pay | Admitting: Internal Medicine

## 2013-03-29 NOTE — Telephone Encounter (Signed)
Patient daughter,Wanda,Notified and states that patient has an appointment with Janett Billow tomorrow to follow up the BP

## 2013-03-30 ENCOUNTER — Ambulatory Visit (INDEPENDENT_AMBULATORY_CARE_PROVIDER_SITE_OTHER): Payer: Medicare Other | Admitting: Nurse Practitioner

## 2013-03-30 ENCOUNTER — Encounter: Payer: Self-pay | Admitting: Nurse Practitioner

## 2013-03-30 VITALS — BP 148/86 | HR 65 | Temp 98.6°F | Wt 216.0 lb

## 2013-03-30 DIAGNOSIS — L739 Follicular disorder, unspecified: Secondary | ICD-10-CM

## 2013-03-30 DIAGNOSIS — E039 Hypothyroidism, unspecified: Secondary | ICD-10-CM

## 2013-03-30 DIAGNOSIS — F0280 Dementia in other diseases classified elsewhere without behavioral disturbance: Secondary | ICD-10-CM

## 2013-03-30 DIAGNOSIS — L738 Other specified follicular disorders: Secondary | ICD-10-CM

## 2013-03-30 DIAGNOSIS — G20 Parkinson's disease: Secondary | ICD-10-CM

## 2013-03-30 DIAGNOSIS — I10 Essential (primary) hypertension: Secondary | ICD-10-CM

## 2013-03-30 MED ORDER — MUPIROCIN CALCIUM 2 % EX CREA: 1.0000 "application " | TOPICAL_CREAM | Freq: Two times a day (BID) | CUTANEOUS | Status: DC

## 2013-03-30 NOTE — Progress Notes (Signed)
Patient ID: Andrea Dorsey, female   DOB: 1929/06/16, 80 y.o.   MRN: [id number]    Allergies  Allergen Reactions  . Ivp Dye [Iodinated Diagnostic Agents]     Only when intravenous, not on external skin.  . Clindamycin Rash    Unclear whether patient has an actual allergy to clindamycin. On beta-lactam at the same time.    Chief Complaint  Patient presents with  . Hypertension    Elevated B/P   . Rash    Bilater lower leg rash x 2 weeks . Examine arms also for rash   . Altered Mental Status    Patient with increased confusion     HPI: Patient is a 80 y.o. female seen in the office today for follow up on blood pressure; stopped doxycyline on the 9th due to no growth in urine.  Increased Diovan on 03/23/13 and blood pressure overall as improved but still elevated at times But daughter reports confusion, lightheadedness and racing thoughts. Not able to tell daughter want is wrong, pt does have hx of dementia  Review of Systems:  Review of Systems  Constitutional: Positive for malaise/fatigue. Negative for fever and chills.  Respiratory: Positive for cough. Negative for shortness of breath.   Cardiovascular: Negative for chest pain and palpitations.  Gastrointestinal: Positive for nausea (conts) and constipation. Negative for abdominal pain and diarrhea.  Genitourinary: Negative for dysuria, urgency and frequency.  Musculoskeletal: Positive for neck pain.  Neurological: Positive for dizziness and weakness. Negative for headaches.  Psychiatric/Behavioral: Positive for memory loss.     Past Medical History  Diagnosis Date  . Benign essential hypertension   . Hypothyroidism   . Osteoarthritis, generalized   . Parkinson disease   . Spinal stenosis   . History of necrotizing fascIItis     left leg, s/p debridement and graft  . Dementia in Parkinson's disease   . Depression    Past Surgical History  Procedure Laterality Date  . Skin debridement  2014    Brambhelt, MD  . Skin  graft  2014    Brambhelt MD  . Spine surgery  2006    spinal stenosis  . Abdominal hysterectomy  1977   Social History:   reports that she has never smoked. She does not have any smokeless tobacco history on file. She reports that she does not drink alcohol or use illicit drugs.  Family History  Problem Relation Age of Onset  . Heart disease Mother   . Heart disease Sister   . Hypertension Sister   . Stroke Sister   . Heart disease Sister     heart attack  . Cancer Sister     colon    Medications: Patient's Medications  New Prescriptions   No medications on file  Previous Medications   ACETAMINOPHEN (TYLENOL) 500 MG TABLET    Take 500 mg by mouth every 6 (six) hours as needed for mild pain.    ASPIRIN 81 MG EC TABLET    Take 1 tablet (81 mg total) by mouth daily.   CARBIDOPA-LEVODOPA-ENTACAPONE (STALEVO) 37.5-150-200 MG PER TABLET    Take 1 tablet by mouth 5 (five) times daily. At 8 AM, 11 AM, 2 PM, 5 PM, and 8 PM   CHOLECALCIFEROL (VITAMIN D3) 2000 UNITS TABS    Take 2,000 Units by mouth daily.    CLONAZEPAM (KLONOPIN) 0.5 MG TABLET    Take 0.5 mg by mouth at bedtime.   DICLOFENAC SODIUM (VOLTAREN) 1 % GEL  Apply 4 g topically 2 (two) times daily. for knee pain   FUROSEMIDE (LASIX) 20 MG TABLET    Take 20 mg by mouth every other day.   LEVOTHYROXINE (SYNTHROID, LEVOTHROID) 25 MCG TABLET    Take 1 tablet (25 mcg total) by mouth daily before breakfast.   METOPROLOL SUCCINATE (TOPROL-XL) 25 MG 24 HR TABLET    Take 50 mg by mouth daily.   NYSTATIN CREAM (MYCOSTATIN)    APPLY TO AFFECTED AREA TWICE A DAY   POTASSIUM CHLORIDE SA (K-DUR,KLOR-CON) 20 MEQ TABLET    Take 1 tablet (20 mEq total) by mouth daily.   RANITIDINE (ZANTAC) 150 MG TABLET    Take 150 mg by mouth as needed for heartburn. Take 1 tablet by mouth as needed for acid reflux.   SENNA (SENOKOT) 8.6 MG TABLET    Take 1 tablet by mouth daily as needed for constipation.    TRAMADOL (ULTRAM) 50 MG TABLET    Take 1 tablet  (50 mg total) by mouth every 8 (eight) hours as needed.   VALSARTAN (DIOVAN) 80 MG TABLET    Take one tablet by mouth once daily to control blood pressure  Modified Medications   No medications on file  Discontinued Medications   DOXYCYCLINE (VIBRA-TABS) 100 MG TABLET    Take 1 tablet (100 mg total) by mouth 2 (two) times daily.     Physical Exam:  Filed Vitals:   03/30/13 1520  BP: 148/86  Pulse: 65  Temp: 98.6 F (37 C)  TempSrc: Oral  Weight: 216 lb (97.977 kg)  SpO2: 97%    Physical Exam  Constitutional: She is well-developed, well-nourished, and in no distress.  HENT:  Head: Normocephalic and atraumatic.  Mouth/Throat: Oropharynx is clear and moist. No oropharyngeal exudate.  Eyes: Conjunctivae and EOM are normal. Pupils are equal, round, and reactive to light.  Neck: Normal range of motion. Neck supple. No thyromegaly present.  Cardiovascular: Normal rate, regular rhythm and normal heart sounds.   Pulmonary/Chest: Effort normal and breath sounds normal.  Abdominal: Soft. Bowel sounds are normal. She exhibits no distension. There is no tenderness.  Musculoskeletal: She exhibits no edema and no tenderness.  Lymphadenopathy:    She has no cervical adenopathy.  Neurological: She is alert.  Skin: Skin is warm and dry. Rash (small diffuse red rash to leg above skin graph and small amount to thigh ) noted.     Labs reviewed: Basic Metabolic Panel:  Recent Labs  10/07/12 1032  11/08/12 1940  12/20/12 1429 03/13/13 1105 03/15/13 1524  NA 142  < >  --   < > 142 140 141  K 4.2  < >  --   < > 4.3 4.9 4.6  CL 104  < >  --   < > 103 104 103  CO2 22  < >  --   < > 25 24 23   GLUCOSE 105*  < >  --   < > 114* 143* 111*  BUN 12  < >  --   < > 15 16 16   CREATININE 1.12*  < > 1.05  < > 1.23* 0.90 0.96  CALCIUM 9.9  < >  --   < > 10.2 10.1 10.0  TSH 3.700  --  4.929*  --   --   --   --   < > = values in this interval not displayed. Liver Function Tests:  Recent Labs   10/07/12 1032 03/13/13 1105 03/15/13 1524  AST 15 21 16   ALT 7 5 <5  ALKPHOS 91 99 93  BILITOT 0.9 0.9 0.8  PROT 6.2 7.3 7.0  ALBUMIN  --  3.6 3.5   No results found for this basename: LIPASE, AMYLASE,  in the last 8760 hours No results found for this basename: AMMONIA,  in the last 8760 hours CBC:  Recent Labs  11/11/12 0403 03/13/13 1100 03/15/13 1524  WBC 5.4 6.5 7.1  NEUTROABS  --  4.4 4.4  HGB 11.1* 15.1* 15.7*  HCT 33.2* 43.7 45.6  MCV 90.7 85.7 86.2  PLT 196 201 222   Lipid Panel:  Recent Labs  10/07/12 1032  HDL 59  LDLCALC 133*  TRIG 74  CHOLHDL 3.5   TSH:  Recent Labs  10/07/12 1032 11/08/12 1940  TSH 3.700 4.929*     Assessment/Plan  1. Folliculitis - mupirocin cream (BACTROBAN) 2 %; Apply 1 application topically 2 (two) times daily until resolved.  Dispense: 15 g; Refill: 0 - to follow up if rash worsens   2. Essential hypertension, benign -blood pressures reviewed and since addition of valsartan blood pressure have been stable except for occasional highs but last 5 days blood pressures have stabilized; will cont to monitor   3. Dementia in Parkinson's disease -daughter reports worsening confusion, most like progression of dementia; will follow up blood work at this time.  - CBC With differential/Platelet - Comprehensive metabolic panel  4. Hypothyroidism -conts on synthroid 25 mcg will up TSH

## 2013-03-30 NOTE — Progress Notes (Signed)
Failed clock drawing  

## 2013-03-30 NOTE — Patient Instructions (Signed)
To apply Bactroban to affected area TWICE DAILY until resolved  Will get blood work today  Please make follow up with REED, DO as soon as next appt available

## 2013-03-31 ENCOUNTER — Ambulatory Visit (INDEPENDENT_AMBULATORY_CARE_PROVIDER_SITE_OTHER): Payer: Medicare Other | Admitting: Internal Medicine

## 2013-03-31 ENCOUNTER — Encounter: Payer: Self-pay | Admitting: Internal Medicine

## 2013-03-31 VITALS — BP 158/96 | HR 54 | Wt 216.0 lb

## 2013-03-31 DIAGNOSIS — I5032 Chronic diastolic (congestive) heart failure: Secondary | ICD-10-CM

## 2013-03-31 DIAGNOSIS — L678 Other hair color and hair shaft abnormalities: Secondary | ICD-10-CM

## 2013-03-31 DIAGNOSIS — I509 Heart failure, unspecified: Secondary | ICD-10-CM

## 2013-03-31 DIAGNOSIS — G4700 Insomnia, unspecified: Secondary | ICD-10-CM

## 2013-03-31 DIAGNOSIS — F0280 Dementia in other diseases classified elsewhere without behavioral disturbance: Secondary | ICD-10-CM

## 2013-03-31 DIAGNOSIS — L738 Other specified follicular disorders: Secondary | ICD-10-CM

## 2013-03-31 DIAGNOSIS — G20 Parkinson's disease: Secondary | ICD-10-CM

## 2013-03-31 DIAGNOSIS — I10 Essential (primary) hypertension: Secondary | ICD-10-CM

## 2013-03-31 DIAGNOSIS — R350 Frequency of micturition: Secondary | ICD-10-CM

## 2013-03-31 DIAGNOSIS — L739 Follicular disorder, unspecified: Secondary | ICD-10-CM

## 2013-03-31 DIAGNOSIS — G20A1 Parkinson's disease without dyskinesia, without mention of fluctuations: Secondary | ICD-10-CM

## 2013-03-31 LAB — COMPREHENSIVE METABOLIC PANEL
A/G RATIO: 1.7 (ref 1.1–2.5)
ALBUMIN: 4.1 g/dL (ref 3.5–4.7)
ALT: 3 IU/L (ref 0–32)
AST: 16 IU/L (ref 0–40)
Alkaline Phosphatase: 108 IU/L (ref 39–117)
BILIRUBIN TOTAL: 0.7 mg/dL (ref 0.0–1.2)
BUN/Creatinine Ratio: 17 (ref 11–26)
BUN: 21 mg/dL (ref 8–27)
CO2: 25 mmol/L (ref 18–29)
Calcium: 10.4 mg/dL — ABNORMAL HIGH (ref 8.7–10.3)
Chloride: 103 mmol/L (ref 97–108)
Creatinine, Ser: 1.23 mg/dL — ABNORMAL HIGH (ref 0.57–1.00)
GFR calc Af Amer: 47 mL/min/{1.73_m2} — ABNORMAL LOW (ref >59)
GFR, EST NON AFRICAN AMERICAN: 41 mL/min/{1.73_m2} — AB (ref >59)
GLUCOSE: 97 mg/dL (ref 65–99)
Globulin, Total: 2.4 g/dL (ref 1.5–4.5)
Potassium: 5.3 mmol/L — ABNORMAL HIGH (ref 3.5–5.2)
Sodium: 143 mmol/L (ref 134–144)
TOTAL PROTEIN: 6.5 g/dL (ref 6.0–8.5)

## 2013-03-31 LAB — CBC WITH DIFFERENTIAL
Basophils Absolute: 0.1 10*3/uL (ref 0.0–0.2)
Basos: 1 %
EOS ABS: 0.3 10*3/uL (ref 0.0–0.4)
EOS: 4 %
HEMATOCRIT: 45.9 % (ref 34.0–46.6)
Hemoglobin: 15.3 g/dL (ref 11.1–15.9)
Immature Grans (Abs): 0 10*3/uL (ref 0.0–0.1)
Immature Granulocytes: 0 %
LYMPHS: 30 %
Lymphocytes Absolute: 2 10*3/uL (ref 0.7–3.1)
MCH: 29.1 pg (ref 26.6–33.0)
MCHC: 33.3 g/dL (ref 31.5–35.7)
MCV: 87 fL (ref 79–97)
MONOCYTES: 12 %
Monocytes Absolute: 0.8 10*3/uL (ref 0.1–0.9)
NEUTROS ABS: 3.6 10*3/uL (ref 1.4–7.0)
Neutrophils Relative %: 53 %
Platelets: 231 10*3/uL (ref 150–379)
RBC: 5.25 x10E6/uL (ref 3.77–5.28)
RDW: 16.2 % — ABNORMAL HIGH (ref 12.3–15.4)
WBC: 6.8 10*3/uL (ref 3.4–10.8)

## 2013-03-31 LAB — TSH: TSH: 2.65 u[IU]/mL (ref 0.450–4.500)

## 2013-03-31 MED ORDER — HYDRALAZINE HCL 25 MG PO TABS: 25.0000 mg | ORAL_TABLET | Freq: Three times a day (TID) | ORAL | Status: DC

## 2013-03-31 NOTE — Progress Notes (Signed)
Patient ID: Andrea Dorsey, female   DOB: 1929/04/29, 80 y.o.   MRN: [id number]   Location:  [facility]  Code Status: DNR  Allergies  Allergen Reactions  . Ivp Dye [Iodinated Diagnostic Agents]     Only when intravenous, not on external skin.  . Clindamycin Rash    Unclear whether patient has an actual allergy to clindamycin. On beta-lactam at the same time.    Chief Complaint  Patient presents with  . Medical Managment of Chronic Issues    3 month follow-up, discuss labs completed yesterday     HPI: Patient is a 80 y.o. black female seen in the office today for medical mgt of chronic diseases.  BP fairly good today even after walking into the office.    Confusion--more noticeable in the mornings.  Pt still not sleeping well most of the time--often wakes up in the middle of the night as she did last night despite clonazepam.  Seroquel was tried prior to that w/o any benefit.    Still goes all of the time.  Less urinary frequency that a couple of weeks ago.  Can be 1-2 hrs between.    No dramatic increase in weight despite decrease of lasix to 20mg  every other day and no increase in edema.  No change in amount of fluid intake. Wt stable in mid 210s since midDec.  Valsartan 80mg  has caused dramatic increase in creatinine.   No infection in last urine culture.  Her daughter would like to hold off on further workup of urinary retention.  Folliculitis of right leg.  Review of Systems:  Review of Systems  Constitutional: Positive for malaise/fatigue.  HENT: Negative for hearing loss.   Eyes: Negative for redness.  Respiratory: Negative for shortness of breath.   Cardiovascular: Negative for chest pain.  Gastrointestinal: Positive for constipation. Negative for abdominal pain, blood in stool and melena.  Genitourinary: Positive for urgency and frequency. Negative for dysuria.  Musculoskeletal: Positive for falls.  Neurological: Positive  for dizziness, tremors and weakness. Negative for loss of consciousness.  Psychiatric/Behavioral: Positive for depression and memory loss.    Past Medical History  Diagnosis Date  . Benign essential hypertension   . Hypothyroidism   . Osteoarthritis, generalized   . Parkinson disease   . Spinal stenosis   . History of necrotizing fascIItis     left leg, s/p debridement and graft  . Dementia in Parkinson's disease   . Depression     Past Surgical History  Procedure Laterality Date  . Skin debridement  2014    Brambhelt, MD  . Skin graft  2014    Brambhelt MD  . Spine surgery  2006    spinal stenosis  . Abdominal hysterectomy  1977    Social History:   reports that she has never smoked. She does not have any smokeless tobacco history on file. She reports that she does not drink alcohol or use illicit drugs.  Family History  Problem Relation Age of Onset  . Heart disease Mother   . Heart disease Sister   . Hypertension Sister   . Stroke Sister   . Heart disease Sister     heart attack  . Cancer Sister     colon    Medications: Patient's Medications  New Prescriptions   No medications on file  Previous Medications   ACETAMINOPHEN (TYLENOL) 500 MG TABLET    Take 500 mg by mouth every 6 (six) hours  as needed for mild pain.    ASPIRIN 81 MG EC TABLET    Take 1 tablet (81 mg total) by mouth daily.   CARBIDOPA-LEVODOPA-ENTACAPONE (STALEVO) 37.5-150-200 MG PER TABLET    Take 1 tablet by mouth 5 (five) times daily. At 8 AM, 11 AM, 2 PM, 5 PM, and 8 PM   CHOLECALCIFEROL (VITAMIN D3) 2000 UNITS TABS    Take 2,000 Units by mouth daily.    CLONAZEPAM (KLONOPIN) 0.5 MG TABLET    Take 0.5 mg by mouth at bedtime.   DICLOFENAC SODIUM (VOLTAREN) 1 % GEL    Apply 4 g topically 2 (two) times daily. for knee pain   FUROSEMIDE (LASIX) 20 MG TABLET    Take 20 mg by mouth every other day.   LEVOTHYROXINE (SYNTHROID, LEVOTHROID) 25 MCG TABLET    Take 1 tablet (25 mcg total) by mouth daily  before breakfast.   METOPROLOL SUCCINATE (TOPROL-XL) 25 MG 24 HR TABLET    Take 50 mg by mouth daily.   MUPIROCIN CREAM (BACTROBAN) 2 %    Apply 1 application topically 2 (two) times daily.   NYSTATIN CREAM (MYCOSTATIN)    APPLY TO AFFECTED AREA TWICE A DAY   POTASSIUM CHLORIDE SA (K-DUR,KLOR-CON) 20 MEQ TABLET    Take 1 tablet (20 mEq total) by mouth daily.   RANITIDINE (ZANTAC) 150 MG TABLET    Take 150 mg by mouth as needed for heartburn. Take 1 tablet by mouth as needed for acid reflux.   SENNA (SENOKOT) 8.6 MG TABLET    Take 1 tablet by mouth daily as needed for constipation.    TRAMADOL (ULTRAM) 50 MG TABLET    Take 1 tablet (50 mg total) by mouth every 8 (eight) hours as needed.   VALSARTAN (DIOVAN) 80 MG TABLET    Take one tablet by mouth once daily to control blood pressure  Modified Medications   No medications on file  Discontinued Medications   No medications on file    Physical Exam: Filed Vitals:   03/31/13 1004  BP: 158/96  Pulse: 54  Weight: 216 lb (97.977 kg)  SpO2: 92%  Physical Exam  Cardiovascular: Normal rate, regular rhythm and normal heart sounds.   Pulmonary/Chest: Effort normal and breath sounds normal. No respiratory distress.  Abdominal: Soft. Bowel sounds are normal. She exhibits no distension and no mass. There is no tenderness.  Musculoskeletal: Normal range of motion. She exhibits no tenderness.  Neurological: She is alert.  cogwheeling rigidity  Skin: Skin is warm and dry.  Psychiatric:  Flat affect, hypophonia    Labs reviewed: Basic Metabolic Panel:  Recent Labs  10/07/12 1032  11/08/12 1940  03/13/13 1105 03/15/13 1524 03/30/13 1631  NA 142  < >  --   < > 140 141 143  K 4.2  < >  --   < > 4.9 4.6 5.3*  CL 104  < >  --   < > 104 103 103  CO2 22  < >  --   < > 24 23 25   GLUCOSE 105*  < >  --   < > 143* 111* 97  BUN 12  < >  --   < > 16 16 21   CREATININE 1.12*  < > 1.05  < > 0.90 0.96 1.23*  CALCIUM 9.9  < >  --   < > 10.1 10.0 10.4*    TSH 3.700  --  4.929*  --   --   --  2.650  < > = values in this interval not displayed. Liver Function Tests:  Recent Labs  10/07/12 1032 03/13/13 1105 03/15/13 1524 03/30/13 1631  AST 15 21 16 16   ALT 7 5 <5 3  ALKPHOS 91 99 93 108  BILITOT 0.9 0.9 0.8 0.7  PROT 6.2 7.3 7.0 6.5  ALBUMIN  --  3.6 3.5  --    No results found for this basename: LIPASE, AMYLASE,  in the last 8760 hours No results found for this basename: AMMONIA,  in the last 8760 hours CBC:  Recent Labs  03/13/13 1100 03/15/13 1524 03/30/13 1631  WBC 6.5 7.1 6.8  NEUTROABS 4.4 4.4 3.6  HGB 15.1* 15.7* 15.3  HCT 43.7 45.6 45.9  MCV 85.7 86.2 87  PLT 201 222 231   Lipid Panel:  Recent Labs  10/07/12 1032  HDL 59  LDLCALC 133*  TRIG 74  CHOLHDL 3.5   Lab Results  Component Value Date   HGBA1C 5.6 10/07/2012   Assessment/Plan 1. Dementia in Parkinson's disease -more confused recently, partially meds and partially her disease--unfortunately not much can be changed  2. Insomnia -still not sleeping well -did not tolerate seroquel -would like to stop clonazepam due to worsening cognition  3. Essential hypertension, benign -reasonably controlled today--148/86 on repeat (has some orthostasis so this is ok plus she is 84) -valsartan has increased creatinine, needs f/u bmp  4. Chronic diastolic CHF (congestive heart failure) -cont reduced dose lasix which she has tolerated -cont to avoid high sodium foods and added sodium and cont fluid restriction  5. Urinary frequency -her daughter did not want a urology referral at this time -could consider myrbetriq, but she did not want to at this time  6. Folliculitis -cont current treatment  Next appt:  6 wks with bmp day of visit

## 2013-04-04 ENCOUNTER — Telehealth: Payer: Self-pay | Admitting: Neurology

## 2013-04-04 NOTE — Telephone Encounter (Signed)
Please see if patient can see one of our NPs for a sooner appt. thx

## 2013-04-04 NOTE — Telephone Encounter (Signed)
Patient having memory issues, difficulty in swallowing and tremors are worse--needs soon appt--please call.

## 2013-04-04 NOTE — Telephone Encounter (Signed)
Patient is calling needing an earlier appt than 06/21/13. Patient is having difficulty swallowing and tremors are worse. Patient was last seen on 02/03/13. Please advise.

## 2013-04-04 NOTE — Telephone Encounter (Signed)
Called patient and spoke with patient's daughter Imogene Burn to make an appt for patient to come in to see Jeani Hawking, NP per [name]. I made appt for patient on 04/08/13 at 9:30 am. I advised that patient's daughter that if the patient has any other problems, questions or concerns to call the office. Patient's daughter verbalized understanding.

## 2013-04-05 ENCOUNTER — Other Ambulatory Visit: Payer: Self-pay | Admitting: Nurse Practitioner

## 2013-04-08 ENCOUNTER — Encounter: Payer: Self-pay | Admitting: Nurse Practitioner

## 2013-04-08 ENCOUNTER — Ambulatory Visit (INDEPENDENT_AMBULATORY_CARE_PROVIDER_SITE_OTHER): Payer: Medicare Other | Admitting: Nurse Practitioner

## 2013-04-08 VITALS — BP 156/80 | HR 82 | Wt 219.0 lb

## 2013-04-08 DIAGNOSIS — R21 Rash and other nonspecific skin eruption: Secondary | ICD-10-CM

## 2013-04-08 DIAGNOSIS — R110 Nausea: Secondary | ICD-10-CM

## 2013-04-08 DIAGNOSIS — G20A1 Parkinson's disease without dyskinesia, without mention of fluctuations: Secondary | ICD-10-CM

## 2013-04-08 DIAGNOSIS — F411 Generalized anxiety disorder: Secondary | ICD-10-CM

## 2013-04-08 DIAGNOSIS — G20 Parkinson's disease: Secondary | ICD-10-CM

## 2013-04-08 DIAGNOSIS — G4700 Insomnia, unspecified: Secondary | ICD-10-CM

## 2013-04-08 MED ORDER — ONDANSETRON HCL 4 MG PO TABS: 4.0000 mg | ORAL_TABLET | Freq: Two times a day (BID) | ORAL | Status: DC

## 2013-04-08 MED ORDER — CARBIDOPA-LEVODOPA-ENTACAPONE 37.5-150-200 MG PO TABS: 1.0000 | ORAL_TABLET | Freq: Four times a day (QID) | ORAL | Status: DC

## 2013-04-08 NOTE — Patient Instructions (Signed)
See Facility visit summary sheet.  Keep next scheduled follow up appointment with [name].

## 2013-04-08 NOTE — Progress Notes (Addendum)
PATIENT: Andrea Dorsey DOB: [birth year]  REASON FOR VISIT: acute visit/sooner revisit for Parkinsonis Disease HISTORY FROM: patient  HISTORY OF PRESENT ILLNESS: Update 04/08/13 (LL): Andrea Dorsey daughter brings her in today for an acute visit.  Since last visit, she is more confused, having memory problems, increased tremor and now tremor on the left hand.  Seroquel was stopped at last visit with [name] and Emmaline Life started to help with sleep. Dr. [name] discontinued her Clonazepam due to increased confusion.  She is not sleeping well at night.  At last visit Stalevo was increased to 5 doses a day from 4.  Her daughter states that her tremor was improved for about a month, but then it worsened.  Patient feels like she is having more hallucinations/dreams of her late husband since last visit.  She presents with a rash today on both legs, and has spread to arms and she is very nauseous-even wretching at times.  She has an appointment at dermatology following this appointment.  Daughter states that the rash started after starting an antibiotic for UTI, but the rash has continued to worsen long after the antibiotic was finished. Daughter allows states that her mother is having intermittent difficulty swallowing.  PRIOR HPI (SA): Andrea Dorsey is a very pleasant 80 year old right-handed woman with an underlying medical history of spinal stenosis, hypothyroidism, osteoarthritis, hypertension, depression, obesity, who presents for followup consultation of her right-sided predominant Parkinson's disease, complicated by hallucinations, memory loss, mood disorder including anxiety and depression, sleep disorder including insomnia, OSA and RBD. She is accompanied by her daughter again today. I first met her on 10/15/2012, at which time a continued her Stalevo. I suggested a small dose of Seroquel to help her sleep and tone down the REM behavior disorder and encouraged him to discuss with her primary care  physician the addition of an antidepressant. She presents with a complaint of worsening tremors.  Today, her daughter states, that the Seroquel is not helping and she is still on 1/2 pill at night. She has been in ALF, [facility] [address] since 10/18/12. She staff reports that patient is not snoring, per daughter. She has had more tremors now also on the L. She has been using a rolling walker for 3 years. She has b/l knee pain, and has had X rays before. She is on tramodol, which is worsening her constipation. She uses Vitamin E cream to her R leg, which was grafted. She has exercises 2 times a week at her ALF. She had some benefit from water exercises in the past, but the chlorine caused itchy skin. She has not fallen recently. She still does not sleep well at night. She does not rest well. She still has vivid dreams and tends to act out in her sleep.  She previously used to see a neurologist at [facility], when she lived in [place], [place]. She was diagnosed with PD about 12 years ago when she was still residing in [place]. She needs assistance with her ADLs. She has been living with her daughter and son-in-law and they have looked into the possibility of a long-term care facility but the patient has been resistant. She has had problems at night including sundowning, confusion, inability to sleep.  Her symptoms started on one side with tremors, but the patient was not sure which side. She has been on Stalevo for the past 2 years, and prior to that she was on C/L, and prior to that she was  on Amantadine. She may not have tried a dopamine agonist or rasagiline in the past. She has been experiencing nausea with her PD medications and still has occasional nausea. In March 2014 she developed necrotizing fasciitis and needed debridement and grafting. She developed hallucinations at the time, but was on pain medications at the time, but the [facility] persisted beyond that. She also started having memory loss then.  She developed cellulitis with complications in her jaw and needed all remaining teeth removed and had IV antibiotics in mid-2014. She has no FHx of PD or dementia. She has no Hx of psychiatric premorbid illness. In 2006 she had back surgery. She has no exposure to chemicals, or agent orange. She has been an anxious person. She is not able to sleep at night. She was tried on Ambien and amitriptyline. She has difficulty with sleep onset and sleep maintenance.  She has occasional urinary incontinence, occasional constipation. She snores, and needed to have a sleep study, but did not go. She has had some dream enactments and has slid out of bed.  She has been very opposed to going into assisted living, but understood that she has a complex medical history and multiple issues and advanced Parkinson's disease and that she was not safe to live by herself.   REVIEW OF SYSTEMS: Full 14 system review of systems performed and notable only for:  Fatigue, neck stiffness, ringing in ears, trouble swallowing, cough, constipation, nausea, insomnia, frequent waking, daytime sleepiness, snoring, frequent infections, frequency of urination, blood in urine, urgency, joint pain, back pain, aching muscles, memory loss, dizziness, weakness, tremors, confusion, nervous,anxious.  ALLERGIES: Allergies  Allergen Reactions  . Ivp Dye [Iodinated Diagnostic Agents]     Only when intravenous, not on external skin.  . Clindamycin Rash    Unclear whether patient has an actual allergy to clindamycin. On beta-lactam at the same time.    HOME MEDICATIONS: Outpatient Prescriptions Prior to Visit  Medication Sig Dispense Refill  . acetaminophen (TYLENOL) 500 MG tablet Take 500 mg by mouth every 6 (six) hours as needed for mild pain.       Marland Kitchen aspirin 81 MG EC tablet Take 1 tablet (81 mg total) by mouth daily.  30 tablet  0  . carbidopa-levodopa-entacapone (STALEVO) 37.5-150-200 MG per tablet Take 1 tablet by mouth 5 (five) times  daily. At 8 AM, 11 AM, 2 PM, 5 PM, and 8 PM      . Cholecalciferol (VITAMIN D3) 2000 UNITS TABS Take 2,000 Units by mouth daily.       . diclofenac sodium (VOLTAREN) 1 % GEL Apply 4 g topically 2 (two) times daily. for knee pain      . furosemide (LASIX) 20 MG tablet Take 20 mg by mouth every other day.      . hydrALAZINE (APRESOLINE) 25 MG tablet Take 1 tablet (25 mg total) by mouth 3 (three) times daily.  90 tablet  0  . levothyroxine (SYNTHROID, LEVOTHROID) 25 MCG tablet Take 1 tablet (25 mcg total) by mouth daily before breakfast.  90 tablet  3  . metoprolol succinate (TOPROL-XL) 25 MG 24 hr tablet Take 50 mg by mouth daily.      . mupirocin ointment (BACTROBAN) 2 % APPLY 1 APPLICATION TOPICALLY 2 (TWO) TIMES DAILY.  22 g  1  . nystatin cream (MYCOSTATIN) APPLY TO AFFECTED AREA TWICE A DAY  30 g  0  . ranitidine (ZANTAC) 150 MG tablet Take 150 mg by mouth as needed for heartburn.  Take 1 tablet by mouth as needed for acid reflux.      . senna (SENOKOT) 8.6 MG tablet Take 1 tablet by mouth daily as needed for constipation.       . traMADol (ULTRAM) 50 MG tablet Take 1 tablet (50 mg total) by mouth every 8 (eight) hours as needed.  90 tablet  0  . potassium chloride SA (K-DUR,KLOR-CON) 20 MEQ tablet Take 1 tablet (20 mEq total) by mouth daily.  90 tablet  0  . clonazePAM (KLONOPIN) 0.5 MG tablet Take 0.5 mg by mouth at bedtime.       No facility-administered medications prior to visit.    PHYSICAL EXAM  Filed Vitals:   04/08/13 0949  BP: 156/80  Pulse: 82  Weight: 219 lb (99.338 kg)   Body mass index is 35.36 kg/(m^2).  Generalized: Well developed, in no acute distress  Head: normocephalic and atraumatic. Oropharynx benign  Neck: Supple, no carotid bruits  Cardiac: Regular rate rhythm, no murmur  Musculoskeletal: No deformity   Neurological examination  Mentation: Alert oriented to time, place, history taking. Follows all commands speech and language fluent Cranial nerve II-XII:  Fundoscopic exam reveals sharp disc margins.Pupils were equal round reactive to light extraocular movements were full, visual field were full on confrontational test. Facial sensation and strength were normal. hearing was intact to finger rubbing bilaterally. Uvula tongue midline. head turning and shoulder shrug and were normal and symmetric.Tongue protrusion into cheek strength was normal. Motor: The motor testing reveals 5 over 5 strength of all 4 extremities. Good symmetric motor tone is noted throughout.  Sensory: Sensory testing is intact to pinprick, soft touch, vibration sensation, and position sense on all 4 extremities. No evidence of extinction is noted.  Coordination: Cerebellar testing reveals good finger-nose-finger and heel-to-shin bilaterally.  Gait and station: Gait is normal. Tandem gait is normal. Romberg is negative. No drift is seen.  Reflexes: Deep tendon reflexes are symmetric and normal bilaterally. Toes are downgoing bilaterally.   DIAGNOSTIC DATA (LABS, IMAGING, TESTING) - I reviewed patient records, labs, notes, testing and imaging myself where available.  Lab Results  Component Value Date   HGBA1C 5.6 10/07/2012   Lab Results  Component Value Date   TSH 2.650 03/30/2013    ASSESSMENT AND PLAN In summary, Andrea Dorsey is a very pleasant 80 year old female with an underlying medical history of spinal stenosis, hypothyroidism, osteoarthritis, hypertension, depression, obesity, necrotizing fasciitis of her right leg, history of cellulitis of her jaw, who presents for FU consultation of her right sided predominant Parkinson's, complicated by hallucinations, anxiety, depression, insomnia and RBD and overall deconditioning. She has been in assisted living since late September of last year. I again spent a long time with the patient and her daughter today, discussing her symptoms, her advanced Parkinson's disease, her complications, her fall risk, her mood disorder and her  sleep disorder.   In consultation with [name] we suggested that we try to decrease Stalevo back to 4 times a day, namely at 8 AM, 12 PM, 4 PM, and 8 PM, since her confusion and hallucinations seem to be worse. For her insomnia and RBD we can try Melationin at 5-10 mg.  She had increased confusion on starting Clonazepam and it was discontinued by the PCP. We will also recheck labs (CMET, CBC, CRP, ESR) and UA to look for organic reason for worsening in her condition. I provided her prescriptions as well as an order for the assisted living facility to execute  these changes. We recommended a GI referral as her nausea has been chronic over some time.   She is advised to use a rolling walker at all times. She was instructed to keep her next already scheduled appointment.  I answered all their questions today and have encouraged them with any questions, concerns, or problems in the interim.   Philmore Pali, MSN, NP-C 04/08/2013, 10:02 AM [facility] [address], [place], [place] [address] [phone]  Note: This document was prepared with digital dictation and possible smart phrase technology. Any transcriptional errors that result from this process are unintentional.  I reviewed the above note and documentation by the Nurse Practitioner and agree with the history, physical exam, assessment and plan as outlined above.

## 2013-04-09 LAB — MICROSCOPIC EXAMINATION

## 2013-04-09 LAB — C-REACTIVE PROTEIN: CRP: 1 mg/L (ref 0.0–4.9)

## 2013-04-09 LAB — CBC WITH DIFFERENTIAL/PLATELET
Basophils Absolute: 0 10*3/uL (ref 0.0–0.2)
Basos: 1 %
Eos: 4 %
Eosinophils Absolute: 0.2 10*3/uL (ref 0.0–0.4)
HCT: 49.4 % — ABNORMAL HIGH (ref 34.0–46.6)
Hemoglobin: 16.7 g/dL — ABNORMAL HIGH (ref 11.1–15.9)
Immature Grans (Abs): 0 10*3/uL (ref 0.0–0.1)
Immature Granulocytes: 0 %
Lymphocytes Absolute: 1.1 10*3/uL (ref 0.7–3.1)
Lymphs: 17 %
MCH: 29.6 pg (ref 26.6–33.0)
MCHC: 33.8 g/dL (ref 31.5–35.7)
MCV: 88 fL (ref 79–97)
Monocytes Absolute: 0.5 10*3/uL (ref 0.1–0.9)
Monocytes: 8 %
Neutrophils Absolute: 4.5 10*3/uL (ref 1.4–7.0)
Neutrophils Relative %: 70 %
RBC: 5.64 x10E6/uL — ABNORMAL HIGH (ref 3.77–5.28)
RDW: 16.6 % — ABNORMAL HIGH (ref 12.3–15.4)
WBC: 6.4 10*3/uL (ref 3.4–10.8)

## 2013-04-09 LAB — URINALYSIS, ROUTINE W REFLEX MICROSCOPIC
Bilirubin, UA: NEGATIVE
Glucose, UA: NEGATIVE
Nitrite, UA: NEGATIVE
RBC, UA: NEGATIVE
Specific Gravity, UA: 1.024 (ref 1.005–1.030)
Urobilinogen, Ur: 0.2 mg/dL (ref 0.0–1.9)
pH, UA: 8.5 — ABNORMAL HIGH (ref 5.0–7.5)

## 2013-04-09 LAB — COMPREHENSIVE METABOLIC PANEL
ALT: 7 IU/L (ref 0–32)
AST: 17 IU/L (ref 0–40)
Albumin/Globulin Ratio: 1.9 (ref 1.1–2.5)
Albumin: 4.8 g/dL — ABNORMAL HIGH (ref 3.5–4.7)
Alkaline Phosphatase: 107 IU/L (ref 39–117)
BUN/Creatinine Ratio: 15 (ref 11–26)
BUN: 18 mg/dL (ref 8–27)
CO2: 23 mmol/L (ref 18–29)
Calcium: 10.9 mg/dL — ABNORMAL HIGH (ref 8.7–10.3)
Chloride: 103 mmol/L (ref 97–108)
Creatinine, Ser: 1.2 mg/dL — ABNORMAL HIGH (ref 0.57–1.00)
GFR calc Af Amer: 49 mL/min/{1.73_m2} — ABNORMAL LOW (ref >59)
GFR calc non Af Amer: 42 mL/min/{1.73_m2} — ABNORMAL LOW (ref >59)
Globulin, Total: 2.5 g/dL (ref 1.5–4.5)
Glucose: 123 mg/dL — ABNORMAL HIGH (ref 65–99)
Potassium: 4.8 mmol/L (ref 3.5–5.2)
Sodium: 144 mmol/L (ref 134–144)
Total Bilirubin: 1 mg/dL (ref 0.0–1.2)
Total Protein: 7.3 g/dL (ref 6.0–8.5)

## 2013-04-09 LAB — SEDIMENTATION RATE: Sed Rate: 16 mm/hr (ref 0–40)

## 2013-04-12 NOTE — Progress Notes (Signed)
Quick Note:  Please call Daughter with the recent blood test and urine test results: There were no significant changes noted. No clear evidence of urinary tract infection. Kidney function is mildly impaired but stable. Try to encourage her to drink more water.  Star Age, MD, PhD [facility] ([facility])  ______

## 2013-04-27 ENCOUNTER — Other Ambulatory Visit: Payer: Self-pay | Admitting: Internal Medicine

## 2013-05-12 ENCOUNTER — Ambulatory Visit (INDEPENDENT_AMBULATORY_CARE_PROVIDER_SITE_OTHER): Payer: Medicare Other | Admitting: Internal Medicine

## 2013-05-12 ENCOUNTER — Encounter: Payer: Self-pay | Admitting: Internal Medicine

## 2013-05-12 VITALS — BP 138/80 | HR 71 | Temp 97.5°F | Wt 222.0 lb

## 2013-05-12 DIAGNOSIS — K219 Gastro-esophageal reflux disease without esophagitis: Secondary | ICD-10-CM

## 2013-05-12 DIAGNOSIS — G4700 Insomnia, unspecified: Secondary | ICD-10-CM | POA: Insufficient documentation

## 2013-05-12 DIAGNOSIS — I5032 Chronic diastolic (congestive) heart failure: Secondary | ICD-10-CM

## 2013-05-12 DIAGNOSIS — R350 Frequency of micturition: Secondary | ICD-10-CM

## 2013-05-12 DIAGNOSIS — G20 Parkinson's disease: Secondary | ICD-10-CM

## 2013-05-12 DIAGNOSIS — I10 Essential (primary) hypertension: Secondary | ICD-10-CM | POA: Insufficient documentation

## 2013-05-12 DIAGNOSIS — I509 Heart failure, unspecified: Secondary | ICD-10-CM

## 2013-05-12 DIAGNOSIS — G20A1 Parkinson's disease without dyskinesia, without mention of fluctuations: Secondary | ICD-10-CM

## 2013-05-12 DIAGNOSIS — F0280 Dementia in other diseases classified elsewhere without behavioral disturbance: Secondary | ICD-10-CM

## 2013-05-12 MED ORDER — OMEPRAZOLE 20 MG PO CPDR: 20.0000 mg | DELAYED_RELEASE_CAPSULE | Freq: Every day | ORAL | Status: DC

## 2013-05-12 MED ORDER — FUROSEMIDE 20 MG PO TABS: 20.0000 mg | ORAL_TABLET | ORAL | Status: DC

## 2013-05-12 MED ORDER — POTASSIUM CHLORIDE CRYS ER 20 MEQ PO TBCR: 20.0000 meq | EXTENDED_RELEASE_TABLET | ORAL | Status: DC

## 2013-05-12 NOTE — Progress Notes (Signed)
Patient ID: Andrea Dorsey, female   DOB: Jun 12, 1929, 80 y.o.   MRN: [id number]   Location:  [facility]  Code Status: DNR, has living will and hcpoa  Allergies  Allergen Reactions  . Ivp Dye [Iodinated Diagnostic Agents]     Only when intravenous, not on external skin.  . Clindamycin Rash    Unclear whether patient has an actual allergy to clindamycin. On beta-lactam at the same time.    Chief Complaint  Patient presents with  . Medical Management of Chronic Issues    6 week follow-up, here with caregiver from [facility]   . Medication Management    Discuss adjusting dose of fluid pill, patient feels fatigued on the days that she takes     HPI: Patient is a 80 y.o. black female seen in the office today for medical mgt of chronic diseases.  Her daughter sent a note requesting her diuretic be reduced even further due to her wooziness on days she takes it.  Can't hold her water when she takes the diuretic.  Is up three lbs in weight since last time.  Is walking more and not short of breath.  Says she eats too much.  Loves her ice cream.    Still does not sleep well.  Wakes up very early in the morning.     Review of Systems:  Review of Systems  Constitutional: Negative for fever, chills, weight loss and malaise/fatigue.  HENT: Positive for hearing loss. Negative for congestion.   Eyes: Negative for blurred vision.  Respiratory: Negative for shortness of breath.   Cardiovascular: Positive for leg swelling. Negative for chest pain.       Edema better lately, has skin graft right leg   Gastrointestinal: Positive for heartburn and nausea. Negative for abdominal pain, constipation, blood in stool and melena.  Genitourinary: Positive for urgency and frequency. Negative for dysuria.  Musculoskeletal: Negative for falls.  Skin: Positive for itching and rash.       Eczema--being followed by dermatology  Neurological: Positive for dizziness,  tremors and sensory change. Negative for loss of consciousness.  Endo/Heme/Allergies: Bruises/bleeds easily.  Psychiatric/Behavioral: Positive for depression and memory loss.       Clearer today    Past Medical History  Diagnosis Date  . Benign essential hypertension   . Hypothyroidism   . Osteoarthritis, generalized   . Parkinson disease   . Spinal stenosis   . History of necrotizing fascIItis     left leg, s/p debridement and graft  . Dementia in Parkinson's disease   . Depression    Past Surgical History  Procedure Laterality Date  . Skin debridement  2014    Brambhelt, MD  . Skin graft  2014    Brambhelt MD  . Spine surgery  2006    spinal stenosis  . Abdominal hysterectomy  1977    Social History:   reports that she has never smoked. She does not have any smokeless tobacco history on file. She reports that she does not drink alcohol or use illicit drugs.  Family History  Problem Relation Age of Onset  . Heart disease Mother   . Heart disease Sister   . Hypertension Sister   . Stroke Sister   . Heart disease Sister     heart attack  . Cancer Sister     colon    Medications: Patient's Medications  New Prescriptions   No medications on file  Previous Medications  ACETAMINOPHEN (TYLENOL) 500 MG TABLET    Take 500 mg by mouth every 6 (six) hours as needed for mild pain.    ASPIRIN 81 MG EC TABLET    Take 1 tablet (81 mg total) by mouth daily.   CARBIDOPA-LEVODOPA-ENTACAPONE (STALEVO) 37.5-150-200 MG PER TABLET    Take 1 tablet by mouth 4 (four) times daily. At 8 AM, 12 PM, 4 PM, and 8 PM   CHOLECALCIFEROL (VITAMIN D3) 2000 UNITS TABS    Take 2,000 Units by mouth daily.    DICLOFENAC SODIUM (VOLTAREN) 1 % GEL    Apply 4 g topically 2 (two) times daily. for knee pain   FUROSEMIDE (LASIX) 20 MG TABLET    Take 20 mg by mouth every other day.   HYDRALAZINE (APRESOLINE) 25 MG TABLET    TAKE 1 TABLET BY MOUTH 3 TIMES A DAY   LEVOTHYROXINE (SYNTHROID, LEVOTHROID) 25  MCG TABLET    Take 1 tablet (25 mcg total) by mouth daily before breakfast.   METOPROLOL SUCCINATE (TOPROL-XL) 25 MG 24 HR TABLET    Take 50 mg by mouth daily.   MUPIROCIN OINTMENT (BACTROBAN) 2 %    APPLY 1 APPLICATION TOPICALLY 2 (TWO) TIMES DAILY.   NYSTATIN CREAM (MYCOSTATIN)    APPLY TO AFFECTED AREA TWICE A DAY   ONDANSETRON (ZOFRAN) 4 MG TABLET    Take 1 tablet (4 mg total) by mouth 2 (two) times daily.   POTASSIUM CHLORIDE SA (K-DUR,KLOR-CON) 20 MEQ TABLET    Take 20 mEq by mouth daily. Use  Every other day.   RANITIDINE (ZANTAC) 150 MG TABLET    Take 150 mg by mouth as needed for heartburn. Take 1 tablet by mouth as needed for acid reflux.   SENNA (SENOKOT) 8.6 MG TABLET    Take 1 tablet by mouth daily as needed for constipation.    TRAMADOL (ULTRAM) 50 MG TABLET    Take 1 tablet (50 mg total) by mouth every 8 (eight) hours as needed.  Modified Medications   No medications on file  Discontinued Medications   No medications on file     Physical Exam: Filed Vitals:   05/12/13 1506  BP: 138/80  Pulse: 71  Temp: 97.5 F (36.4 C)  TempSrc: Oral  Weight: 222 lb (100.699 kg)  SpO2: 97%  Physical Exam  Constitutional: She appears well-developed and well-nourished. No distress.  Cardiovascular: Normal rate, regular rhythm and intact distal pulses.   Murmur heard. Pulmonary/Chest: Effort normal and breath sounds normal. No respiratory distress. She has no rales.  Abdominal: Soft. Bowel sounds are normal. She exhibits no distension and no mass. There is no tenderness.  Musculoskeletal:  Has some cogwheel rigidity of extremities and resting tremors  Neurological: She is alert. She exhibits abnormal muscle tone.  Oriented to person and place today, participating well in visit  Skin: Skin is warm and dry.  Petechial appearing rash over legs bilaterally in patches, also an area on her left lower abdomen and some on bilateral arms, evidence of excoriation;  Has skin graft to right lower  leg  Psychiatric: She has a normal mood and affect.  Pleasant, some flattening of affect and mild hypophonia    Labs reviewed: Basic Metabolic Panel:  Recent Labs  10/07/12 1032  11/08/12 1940  03/15/13 1524 03/30/13 1631 04/08/13 1054  NA 142  < >  --   < > 141 143 144  K 4.2  < >  --   < > 4.6 5.3*  4.8  CL 104  < >  --   < > 103 103 103  CO2 22  < >  --   < > 23 25 23   GLUCOSE 105*  < >  --   < > 111* 97 123*  BUN 12  < >  --   < > 16 21 18   CREATININE 1.12*  < > 1.05  < > 0.96 1.23* 1.20*  CALCIUM 9.9  < >  --   < > 10.0 10.4* 10.9*  TSH 3.700  --  4.929*  --   --  2.650  --   < > = values in this interval not displayed. Liver Function Tests:  Recent Labs  10/07/12 1032  03/13/13 1105 03/15/13 1524 03/30/13 1631 04/08/13 1054  AST 15  --  21 16 16 17   ALT 7  --  5 5 3 7   ALKPHOS 91  --  99 93 108 107  BILITOT 0.9  --  0.9 0.8 0.7 1.0  PROT 6.2  < > 7.3 7.0 6.5 7.3  ALBUMIN  --   --  3.6 3.5  --   --   < > = values in this interval not displayed. CBC:  Recent Labs  03/13/13 1100 03/15/13 1524 03/30/13 1631 04/08/13 1054  WBC 6.5 7.1 6.8 6.4  NEUTROABS 4.4 4.4 3.6 4.5  HGB 15.1* 15.7* 15.3 16.7*  HCT 43.7 45.6 45.9 49.4*  MCV 85.7 86.2 87 88  PLT 201 222 231  --    Lipid Panel:  Recent Labs  10/07/12 1032  HDL 59  LDLCALC 133*  TRIG 74  CHOLHDL 3.5   Lab Results  Component Value Date   HGBA1C 5.6 10/07/2012   Assessment/Plan 1. Dementia in Parkinson's disease -doing better right now--no recent infections or CHF exacerbations -no changes needed in regard to this  2. Chronic diastolic CHF (congestive heart failure) - due to her difficulty with urinary frequency and incontinence, we will try to even further reduce her lasix to 20mg  every 3 days from every other day and also reduce the kcl [id number] to every 3 days from every other day - furosemide (LASIX) 20 MG tablet; Take 1 tablet (20 mg total) by mouth every 3 (three) days.  Dispense: 30  tablet; Refill: 0 -cont to follow daily weights, limit fluid and sodium intake, monitor breathing and edema  3. Essential hypertension, benign -bp is finally improved to goal since hydralazine was added to her regimen last time--also having less lightheadedness on standing which is thought to be orthostatic hypotension   4. Insomnia -still not sleeping with melatonin 5mg  so increase to 10mg  at bedtime  5. Urinary frequency -will try decreasing lasix again to 20mg  q 3 days, but if weight trends up or she becomes short of breath again, we will need to return to qod (also will need to change kcl back to qod)  6. GERD (gastroesophageal reflux disease) - seems to be etiology of "nausea" she has in the mornings around when she takes her meds -reviewed that thyroid med is only one on an empty stomach -add prilosec to take just before breakfast due to the "nausea" and her cough/tickle in her throat which may be GERD - omeprazole (PRILOSEC) 20 MG capsule; Take 1 capsule (20 mg total) by mouth daily.  Dispense: 30 capsule; Refill: 0 -try to then eat before other pills are taken  Labs/tests ordered: none today Next appt:  3 mos

## 2013-05-16 ENCOUNTER — Other Ambulatory Visit: Payer: Self-pay | Admitting: *Deleted

## 2013-05-16 MED ORDER — AMBULATORY NON FORMULARY MEDICATION: Status: DC

## 2013-05-16 NOTE — Telephone Encounter (Signed)
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

## 2013-05-23 ENCOUNTER — Telehealth: Payer: Self-pay | Admitting: *Deleted

## 2013-05-23 NOTE — Telephone Encounter (Signed)
Patient daughter calling stating that patient is only taking Lasix every 3 days and her legs still feel weak and wants to know if there is anything else she can do for this. Please Advise.

## 2013-05-23 NOTE — Telephone Encounter (Signed)
Has her weight gone up or has she become more short of breath OR is she tolerating the lower dose?   Her leg weakness can only be helped by physical therapy if her CHF is stable.  This, too, is limited as her Parkinson's disease progresses.

## 2013-05-24 NOTE — Telephone Encounter (Signed)
LMOM to return call.

## 2013-05-24 NOTE — Telephone Encounter (Signed)
Let's try to stop her lasix.  We'll need to send the order to [facility]--d/c lasix as currently ordered and begin lasix 20mg  daily PRN weight gain of 3 lbs in 24 hrs or 5 lbs in 1 week.  We can also order PT for her through [facility] due to proximal muscle weakness and Parkinson's disease.

## 2013-05-24 NOTE — Telephone Encounter (Signed)
Spoke with patient's daughter, no SOB. Patient has weakness in her legs that decreases her mobility on the days that she takes Lasix. If physical therapy is recommended patient will need an order to have insurance cover. Patient has used [facility] within the last year for home health, ok to use them for PT as well.   Dr.Reed please advise

## 2013-05-25 NOTE — Telephone Encounter (Signed)
LMOM to return call.

## 2013-05-25 NOTE — Telephone Encounter (Signed)
Patient daughter, [name], Notified. Called [facility] and Spoke with [place] and gave her verbal orders and also faxed them to Fax#: [phone] She stated they will make the medication adjustments and will set up the PT there at the facility.

## 2013-05-31 ENCOUNTER — Other Ambulatory Visit: Payer: Self-pay | Admitting: Internal Medicine

## 2013-06-01 DIAGNOSIS — G3183 Dementia with Lewy bodies: Secondary | ICD-10-CM

## 2013-06-01 DIAGNOSIS — I5032 Chronic diastolic (congestive) heart failure: Secondary | ICD-10-CM

## 2013-06-01 DIAGNOSIS — R269 Unspecified abnormalities of gait and mobility: Secondary | ICD-10-CM

## 2013-06-01 DIAGNOSIS — IMO0002 Reserved for concepts with insufficient information to code with codable children: Secondary | ICD-10-CM

## 2013-06-01 DIAGNOSIS — F329 Major depressive disorder, single episode, unspecified: Secondary | ICD-10-CM

## 2013-06-01 DIAGNOSIS — I10 Essential (primary) hypertension: Secondary | ICD-10-CM

## 2013-06-01 DIAGNOSIS — F0280 Dementia in other diseases classified elsewhere without behavioral disturbance: Secondary | ICD-10-CM

## 2013-06-01 DIAGNOSIS — F3289 Other specified depressive episodes: Secondary | ICD-10-CM

## 2013-06-01 DIAGNOSIS — M1710 Unilateral primary osteoarthritis, unspecified knee: Secondary | ICD-10-CM

## 2013-06-03 ENCOUNTER — Encounter: Payer: Self-pay | Admitting: Neurology

## 2013-06-03 ENCOUNTER — Telehealth: Payer: Self-pay | Admitting: Neurology

## 2013-06-03 NOTE — Telephone Encounter (Signed)
Left message regarding rescheduling 06/21/13 appointment per Dr. Guadelupe Sabin schedule. Printed and mailed letter with new appointment time.

## 2013-06-08 ENCOUNTER — Ambulatory Visit (INDEPENDENT_AMBULATORY_CARE_PROVIDER_SITE_OTHER): Payer: Medicare Other | Admitting: Neurology

## 2013-06-08 ENCOUNTER — Encounter: Payer: Self-pay | Admitting: Neurology

## 2013-06-08 VITALS — BP 202/88 | HR 74 | Temp 98.4°F | Ht 62.5 in | Wt 222.0 lb

## 2013-06-08 DIAGNOSIS — G4752 REM sleep behavior disorder: Secondary | ICD-10-CM

## 2013-06-08 DIAGNOSIS — G20 Parkinson's disease: Secondary | ICD-10-CM

## 2013-06-08 DIAGNOSIS — R413 Other amnesia: Secondary | ICD-10-CM

## 2013-06-08 DIAGNOSIS — R21 Rash and other nonspecific skin eruption: Secondary | ICD-10-CM

## 2013-06-08 DIAGNOSIS — G4700 Insomnia, unspecified: Secondary | ICD-10-CM

## 2013-06-08 MED ORDER — CARBIDOPA-LEVODOPA 25-100 MG PO TBDP: ORAL_TABLET | ORAL | Status: DC

## 2013-06-08 NOTE — Progress Notes (Signed)
Subjective:    Patient ID: Andrea Dorsey is a 80 y.o. female.  HPI    Interim history:   Andrea Dorsey is a very pleasant 80 year old right-handed woman with an underlying complex medical history of spinal stenosis, necrotizing fasciitis, status post debridement and grafting on the right lower leg, lower extremity edema bilaterally, insomnia, hypothyroidism, osteoarthritis, hypertension, depression, and obesity, who presents for followup consultation of her advanced, right-sided predominant Parkinson's disease, complicated by hallucinations, memory loss, mood disorder including anxiety and depression, sleep disorder including insomnia, OSA and RBD. She is accompanied by her daughter again today. I last saw her on 02/03/13, at which time I increased her Stalevo to 5 times a day. We talked about potential side effects. I asked her to stop the Seroquel and started her on low-dose clonazepam for insomnia and RBD. In the interim she was seen by our nurse practitioner, Ms. Lam on 04/08/2013, at which time I also saw her, and we mutually decided to decrease Stalevo back to 4 times a day because of worsening confusion and hallucinations. Her clonazepam was discontinued by her PCP because of side effects. I suggested a trial of melatonin, 5-10 mg. We checked some labs including CBC, CMP, CRP, ESR and urinalysis. Labs showed no significant abnormalities, mild but stable kidney impairment was noted. She was encouraged to drink more water. She has seen her PCP in April 2015 and was advised to take her fluid pill as needed, an increase of melatonin to 10 mg.   Today, she reports episodes of freezing and severe rigidity which makes it difficult for her to stand or initiate walking. She uses her rolling walker. There is no recent report of falls. She still has insomnia and this has been a chronic ongoing problem for her. She has some lower extremity swelling and her blood pressure today is elevated but she says she has  been anxious. She reports no other new Parkinson's symptoms or progression but is quite bothered by this sudden sense of overwhelming stiffness and heaviness in both legs.  I first met her on 10/15/2012, at which time a continued her Stalevo. I suggested a small dose of Seroquel to help her sleep and tone down the REM behavior disorder and encouraged him to discuss with her primary care physician the addition of an antidepressant. She presents with a complaint of worsening tremors.  Today, her daughter states, that the Seroquel is not helping and she is still on 1/2 pill at night. She has been in [facility] [address] since 10/18/12. She staff reports that patient is not snoring, per daughter. She has had more tremors now also on the L. She has been using a rolling walker for 3 years. She has b/l knee pain, and has had X rays before. She is on tramodol, which is worsening her constipation. She uses Vitamin E cream to her R leg, which was grafted. She has exercises 2 times a week at her ALF. She had some benefit from water exercises in the past, but the chlorine caused itchy skin. She has not fallen recently. She still does not sleep well at night. She does not rest well. She still has vivid dreams and tends to act out in her sleep.  She previously used to see a neurologist at [facility], when she lived in [place], [place]. She was diagnosed with PD about 12 years ago when she was still residing in [place]. She needs assistance with her ADLs. She has been  living with her daughter and son-in-law and they have looked into the possibility of a long-term care facility but the patient has been resistant. She has had problems at night including sundowning, confusion, inability to sleep.  Her symptoms started on one side with tremors, but the patient was not sure which side. She has been on Stalevo for the past 2 years, and prior to that she was on C/L, and prior to that she was on Amantadine. She may not  have tried a dopamine agonist or rasagiline in the past. She has been experiencing nausea with her PD medications and still has occasional nausea. In March 2014 she developed necrotizing fasciitis and needed debridement and grafting. She developed hallucinations at the time, but was on pain medications at the time, but the [facility] persisted beyond that. She also started having memory loss then. She developed cellulitis with complications in her jaw and needed all remaining teeth removed and had IV antibiotics in mid-2014. She has no FHx of PD or dementia. She has no Hx of psychiatric premorbid illness. In 2006 she had back surgery. She has no exposure to chemicals, or agent orange. She has been an anxious person. She is not able to sleep at night. She was tried on Ambien and amitriptyline. She has difficulty with sleep onset and sleep maintenance.  She has occasional urinary incontinence, occasional constipation. She snores, and needed to have a sleep study, but did not go. She has had some dream enactments and has slid out of bed.  She had been very opposed to going into assisted living, but understood that she given her complex medical history and multiple issues and advanced Parkinson's disease she was no longer safe to live by herself.   Her Past Medical History Is Significant For: Past Medical History  Diagnosis Date  . Benign essential hypertension   . Hypothyroidism   . Osteoarthritis, generalized   . Parkinson disease   . Spinal stenosis   . History of necrotizing fascIItis     left leg, s/p debridement and graft  . Dementia in Parkinson's disease   . Depression     Her Past Surgical History Is Significant For: Past Surgical History  Procedure Laterality Date  . Skin debridement  2014    Brambhelt, MD  . Skin graft  2014    Brambhelt MD  . Spine surgery  2006    spinal stenosis  . Abdominal hysterectomy  1977    Her Family History Is Significant For: Family History  Problem  Relation Age of Onset  . Heart disease Mother   . Heart disease Sister   . Hypertension Sister   . Stroke Sister   . Heart disease Sister     heart attack  . Cancer Sister     colon    Her Social History Is Significant For: History   Social History  . Marital Status: Widowed    Spouse Name: N/A    Number of Children: 2  . Years of Education: 12   Occupational History  .      retired    Social History Main Topics  . Smoking status: Never Smoker   . Smokeless tobacco: Never Used  . Alcohol Use: No  . Drug Use: No  . Sexual Activity: Not Currently   Other Topics Concern  . None   Social History Narrative   Patient lives in assisted living.patient is right handed    Her Allergies Are:  Allergies  Allergen Reactions  .  Ivp Dye [Iodinated Diagnostic Agents]     Only when intravenous, not on external skin.  . Clindamycin Rash    Unclear whether patient has an actual allergy to clindamycin. On beta-lactam at the same time.  :   Her Current Medications Are:  Outpatient Encounter Prescriptions as of 06/08/2013  Medication Sig  . acetaminophen (TYLENOL) 500 MG tablet Take 500 mg by mouth every 6 (six) hours as needed for mild pain.   Marland Kitchen AMBULATORY NON FORMULARY MEDICATION Prilosec 20.6 Sig: Take one tablet by mouth once daily for stomach  . aspirin 81 MG EC tablet Take 1 tablet (81 mg total) by mouth daily.  . carbidopa-levodopa-entacapone (STALEVO) 37.5-150-200 MG per tablet Take 1 tablet by mouth 4 (four) times daily. At 8 AM, 12 PM, 4 PM, and 8 PM  . Cholecalciferol (VITAMIN D3) 2000 UNITS TABS Take 2,000 Units by mouth daily.   . diclofenac sodium (VOLTAREN) 1 % GEL Apply 4 g topically 2 (two) times daily. for knee pain  . furosemide (LASIX) 20 MG tablet Take 20 mg by mouth. As needed or if weight gain is more than 3 lbs in one day  . hydrALAZINE (APRESOLINE) 25 MG tablet TAKE 1 TABLET BY MOUTH 3 TIMES A DAY  . levothyroxine (SYNTHROID, LEVOTHROID) 25 MCG tablet Take  1 tablet (25 mcg total) by mouth daily before breakfast.  . Melatonin 10 MG CAPS Take 10 mg by mouth. Daily at night, around 8:00 pm  . metoprolol succinate (TOPROL-XL) 25 MG 24 hr tablet Take 50 mg by mouth daily.  . mupirocin ointment (BACTROBAN) 2 % APPLY 1 APPLICATION TOPICALLY 2 (TWO) TIMES DAILY.  Marland Kitchen nystatin (MYCOSTATIN) powder Apply topically at bedtime.  Marland Kitchen omeprazole (PRILOSEC) 20 MG capsule Take 1 capsule (20 mg total) by mouth daily.  . potassium chloride SA (K-DUR,KLOR-CON) 20 MEQ tablet Take 1 tablet (20 mEq total) by mouth every 3 (three) days. Use  Every other day.  . ranitidine (ZANTAC) 150 MG tablet Take 150 mg by mouth as needed for heartburn. Take 1 tablet by mouth as needed for acid reflux.  . senna (SENOKOT) 8.6 MG tablet Take 1 tablet by mouth daily as needed for constipation.   . traMADol (ULTRAM) 50 MG tablet Take 1 tablet (50 mg total) by mouth every 8 (eight) hours as needed.  . [DISCONTINUED] furosemide (LASIX) 20 MG tablet Take 1 tablet (20 mg total) by mouth every 3 (three) days.  . [DISCONTINUED] nystatin cream (MYCOSTATIN) APPLY TO AFFECTED AREA TWICE A DAY  :  Review of Systems:  Out of a complete 14 point review of systems, all are reviewed and negative with the exception of these symptoms as listed below:   Review of Systems  HENT: Positive for drooling.        Running nose,slight drooling  Respiratory: Positive for cough.   Gastrointestinal: Positive for constipation.       Frequency in urination  Musculoskeletal: Positive for back pain, gait problem, neck pain and neck stiffness.       Joint swelling,joint pain, aching muscles,muscle cramps,  Skin: Positive for rash.       itching  Neurological: Positive for tremors, weakness and numbness.       Insomnia,daytime sleepiness, snoring  Psychiatric/Behavioral: Positive for confusion. The patient is nervous/anxious.        Slight confusion    Objective:  Neurologic Exam  Physical Exam Physical  Examination:   Filed Vitals:   06/08/13 1201  BP: 202/88  Pulse:  74  Temp: 98.4 F (36.9 C)   General Examination: The patient is a very pleasant 80 y.o. female in no acute distress. She is obese. She appears frail and deconditioned. She is able to provide her history better today.   HEENT: Normocephalic, atraumatic, pupils are equal, round and reactive to light and accommodation. Extraocular tracking shows moderate saccadic breakdown without nystagmus noted. There is limitation to upper gaze. There is mild decrease in eye blink rate. Hearing is impaired mildly. Funduscopic exam is normal, but there is evidence of cataract bilaterally. Face is symmetric with moderate facial masking and normal facial sensation. There is no lip, neck or jaw tremor. Neck is moderately rigid with intact passive ROM. There are no carotid bruits on auscultation. Oropharynx exam reveals moderate mouth dryness. There is significant airway crowding noted d/t redundant soft palate and large tongue. She is edentulous. Mallampati is class III. Tongue protrudes centrally and palate elevates symmetrically. There is mild drooling.   Chest: is clear to auscultation without wheezing, rhonchi or crackles noted.  Heart: sounds are regular and normal without murmurs, rubs or gallops noted.   Abdomen: is soft, non-tender and non-distended with normal bowel sounds appreciated on auscultation.  Extremities: There is 2+ pitting edema in the distal lower extremities bilaterally. Pedal pulses are intact. Chronic stasis-like changes are noted in the distal legs bilaterally with s/p skin grafting on the right. There are no varicose veins.  Skin: is warm and dry with no trophic changes noted. Age-related changes are noted on the skin.   Musculoskeletal: exam reveals no obvious joint deformities, tenderness, joint swelling or erythema.  Neurologically:  Mental status: The patient is awake and alert, paying good  attention. She is able  to partially provide the history. Her daughter provides details and most of the entire history. She is oriented to: person, place, time/date and situation. Her memory, attention, language and knowledge are impaired mildly. There is no aphasia, agnosia, apraxia or anomia. There is a moderate degree of bradyphrenia. Speech is moderately hypophonic with mild dysarthria noted, however speech is edentulous as well. Mood is congruent and affect is normal.    Cranial nerves are as described above under HEENT exam. In addition, shoulder shrug is normal with equal shoulder height noted.  Motor exam: Normal bulk, and strength for age is noted. There are no dyskinesias noted.  Tone is mildly rigid with presence of cogwheeling in the right>left upper extremity and in both LEs. There is overall moderate bradykinesia. There is no drift or rebound.  There is an intermittent mild to moderate UE resting tremor on the right and a slight intermittent resting tremor in the left upper extremity, not much changed.  Romberg is not tested.   Reflexes are 1+ in the upper extremities and trace in the knees and absent in the ankle.   Fine motor skills exam: Finger taps are moderately impaired on the right and mild to moderately impaired on the left. Hand movements are moderately impaired on the right and mild to moderately impaired on the left. RAP (rapid alternating patting) is mildly impaired on the right and mildly impaired on the left. Foot taps are moderate to severely impaired on the right and moderately impaired on the left. Foot agility (in the form of heel stomping) is moderately impaired on the right and moderately impaired on the left.    Cerebellar testing shows no dysmetria or intention tremor on finger to nose testing. Heel to shin is not possible for her. There  is no truncal or gait ataxia.   Sensory exam is intact to light touch, pinprick, vibration, temperature sense in the upper and lower extremities.   Gait,  station and balance: She stands up from the seated position with significant difficulty and needs to push herself up with her hands and also requires assistance. No veers backwards and to the right side. She is noted to lean to the R side. Posture is moderately stooped. Stance is wide-based. She walks with significant decrease in stride length and pace and has to rely on her rolling walker which she brought in today, but maneuvers it well. Tandem walk is not possible. Balance is moderately impaired. She is not able to do a toe or heel stance. She has mild trouble turning.    Assessment and Plan:   In summary, Andrea Dorsey is a very pleasant 80 year old female with an underlying medical history of spinal stenosis, hypothyroidism, osteoarthritis, hypertension, depression, obesity, necrotizing fasciitis of her right leg, history of cellulitis of her jaw, who presents for FU consultation of her advanced, right sided predominant Parkinson's, complicated by hallucinations, anxiety, depression, insomnia and RBD, lower extremity swelling and overall deconditioning. She has been in assisted living since late September 2014, and has had a reasonable transition overall. Her situation is complicated because of advanced age, side effects with medications and medication increases in the past and progression of her parkinsonian signs and symptoms. On my exam she does have a mild degree of progression and she reports episodes of rigidity and freezing. I again spent a long time with the patient and her daughter today, discussing her symptoms, her advanced Parkinson's disease, her complications, her fall risk, her mood disorder and her sleep disorder. I suggested that we keep her Stalevo 150 it 4 times a day, at 8, 12, 4 PM and 8 PM. Previously she had increase in confusion and hallucinations as we increased the Stalevo to 5 times a day. Nevertheless, I think she may benefit from a small increase in levodopa as needed by  adding Parcopa 25-100 mg strength half a pill up to twice daily as needed. She should be able to take this on her own and I will make a note in her chart that she should be able to self dispense this as needed medication to help her with the episodic freezing and increase in rigidity. She is advised that this may take some time to kick in and it is worth trying. I think the dose is low enough that we can get away with it without increasing side effects but she still is at risk for increasing confusion, lethargy and hallucinations. I provided her with a new prescription for Parcopa. She has an appointment with me in October. In the interim, would like for her to see Charlott Holler in about 3 months for a recheck. I answered all their questions today and the patient and her daughter were in agreement. I encouraged them to call with any questions, concerns, or problems in the interim.

## 2013-06-08 NOTE — Patient Instructions (Signed)
We will try a dissolvable levodopa for as needed use, if you suddenly have freezing or more rigidity: Parcopa 25/100 mg strength take 1/2 pill dissolved in your mouth up to 2 times a day as needed. We will continue Stalevo 150 mg 4 times a day at the current times, unchanged.  Follow up with Charlott Holler in 3 months, and in October with me as scheduled.

## 2013-06-21 ENCOUNTER — Ambulatory Visit: Payer: Medicare Other | Admitting: Neurology

## 2013-06-26 ENCOUNTER — Other Ambulatory Visit: Payer: Self-pay | Admitting: Internal Medicine

## 2013-07-13 ENCOUNTER — Ambulatory Visit (INDEPENDENT_AMBULATORY_CARE_PROVIDER_SITE_OTHER): Payer: Medicare Other | Admitting: Nurse Practitioner

## 2013-07-13 ENCOUNTER — Encounter: Payer: Self-pay | Admitting: Nurse Practitioner

## 2013-07-13 ENCOUNTER — Encounter (INDEPENDENT_AMBULATORY_CARE_PROVIDER_SITE_OTHER): Payer: Self-pay

## 2013-07-13 VITALS — BP 157/68 | HR 64

## 2013-07-13 DIAGNOSIS — F411 Generalized anxiety disorder: Secondary | ICD-10-CM

## 2013-07-13 DIAGNOSIS — F329 Major depressive disorder, single episode, unspecified: Secondary | ICD-10-CM

## 2013-07-13 DIAGNOSIS — G20A1 Parkinson's disease without dyskinesia, without mention of fluctuations: Secondary | ICD-10-CM

## 2013-07-13 DIAGNOSIS — G4700 Insomnia, unspecified: Secondary | ICD-10-CM

## 2013-07-13 DIAGNOSIS — F3289 Other specified depressive episodes: Secondary | ICD-10-CM

## 2013-07-13 DIAGNOSIS — F32A Depression, unspecified: Secondary | ICD-10-CM

## 2013-07-13 DIAGNOSIS — G20 Parkinson's disease: Secondary | ICD-10-CM

## 2013-07-13 MED ORDER — CARBIDOPA-LEVODOPA 25-100 MG PO TBDP: ORAL_TABLET | ORAL | Status: DC

## 2013-07-13 NOTE — Patient Instructions (Signed)
We will try a dissolvable levodopa for as needed use, if you suddenly have freezing or more rigidity: Parcopa 25/100 mg strength take 1 pill dissolved in your mouth up to 2 times a day as needed. We will continue Stalevo 150 mg 4 times a day at the current times, unchanged.   I have given you information about a trainer in town that has programs specific for Parkinson's patients.  The program is [facility] by Andrea Dorsey, it may benefit Andrea Dorsey greatly to have more focused therapy.  Please keep next scheduled appt with Andrea Dorsey, and let us know if there is no improvement with the med changes.

## 2013-07-13 NOTE — Progress Notes (Signed)
PATIENT: Andrea Dorsey DOB: [birth year]  REASON FOR VISIT: sooner follow up requested for PD HISTORY FROM: patient  HISTORY OF PRESENT ILLNESS: Ms. Grist is a very pleasant 80 year old right-handed woman with an underlying complex medical history of spinal stenosis, necrotizing fasciitis, status post debridement and grafting on the right lower leg, lower extremity edema bilaterally, insomnia, hypothyroidism, osteoarthritis, hypertension, depression, and obesity, who presents for followup consultation of her advanced, right-sided predominant Parkinson's disease, complicated by hallucinations, memory loss, mood disorder including anxiety and depression, sleep disorder including insomnia, OSA and RBD.   She is accompanied by her daughter again today. She was seen on 02/03/13 with [name], at which time we increased her Stalevo to 5 times a day. We talked about potential side effects. I asked her to stop the Seroquel and started her on low-dose clonazepam for insomnia and RBD. In the interim she was seen by me on 04/08/2013, also with [name], and we mutually decided to decrease Stalevo back to 4 times a day because of worsening confusion and hallucinations. Her clonazepam was discontinued by her PCP because of side effects. I suggested a trial of melatonin, 5-10 mg. We checked some labs including CBC, CMP, CRP, ESR and urinalysis. Labs showed no significant abnormalities, mild but stable kidney impairment was noted. She was encouraged to drink more water. She has seen her PCP in April 2015 and was advised to take her fluid pill as needed, and increase melatonin to 10 mg. She was seen again by [name] on 06/08/13 with worsening freezing episodes and [name] added 1/2 tablet of Parcopa 25-100 mg twice daily.  Today, she reports continued episodes of freezing and severe rigidity which makes it difficult for her to stand or initiate walking. She uses her rolling walker. There is no recent report of  falls. She still has insomnia and this has been a chronic ongoing problem for her. She has some lower extremity swelling and her blood pressure today is elevated but she says she has been anxious. She reports no other new Parkinson's symptoms or progression but is quite bothered by this sudden sense of overwhelming stiffness and heaviness in both legs. She has been having physical therapy sessions ordered by Dr. [name], twice weekly, with no improvement in her mobility.  Daughter states that she mostly sits in a chair.  I proposed they try enrolling in a activity program at [facility], a local trainer who has specialty PT programs for Parkinson's patients.  Ms. Dula was resistant to the idea; that it may be too much for her.  I tried to explain to her that we were at the end of the line for medication options to treat her Parkinson's and she could help herself more by trying to be more active.  She is possibly burdened with increased depression.  Her daughter agreed and stated that she was doing much better when she had been involved in water exercises previously.  (Prior HPI): [name] first saw her on 10/15/2012, at which time she continued her Stalevo. She suggested a small dose of Seroquel to help her sleep and tone down the REM behavior disorder and encouraged him to discuss with her primary care physician the addition of an antidepressant. She presents with a complaint of worsening tremors.  Today, her daughter states, that the Seroquel is not helping and she is still on 1/2 pill at night. She has been in [facility] [address] since 10/18/12. She  staff reports that patient is not snoring, per daughter. She has had more tremors now also on the L. She has been using a rolling walker for 3 years. She has b/l knee pain, and has had X rays before. She is on tramodol, which is worsening her constipation. She uses Vitamin E cream to her R leg, which was grafted. She has exercises 2 times a week at  her ALF. She had some benefit from water exercises in the past, but the chlorine caused itchy skin. She has not fallen recently. She still does not sleep well at night. She does not rest well. She still has vivid dreams and tends to act out in her sleep.  She previously used to see a neurologist at [facility], when she lived in [place], [place]. She was diagnosed with PD about 12 years ago when she was still residing in [place]. She needs assistance with her ADLs. She has been living with her daughter and son-in-law and they have looked into the possibility of a long-term care facility but the patient has been resistant. She has had problems at night including sundowning, confusion, inability to sleep.  Her symptoms started on one side with tremors, but the patient was not sure which side. She has been on Stalevo for the past 2 years, and prior to that she was on C/L, and prior to that she was on Amantadine. She may not have tried a dopamine agonist or rasagiline in the past. She has been experiencing nausea with her PD medications and still has occasional nausea. In March 2014 she developed necrotizing fasciitis and needed debridement and grafting. She developed hallucinations at the time, but was on pain medications at the time, but the [facility] persisted beyond that. She also started having memory loss then. She developed cellulitis with complications in her jaw and needed all remaining teeth removed and had IV antibiotics in mid-2014. She has no FHx of PD or dementia. She has no Hx of psychiatric premorbid illness. In 2006 she had back surgery. She has no exposure to chemicals, or agent orange. She has been an anxious person. She is not able to sleep at night. She was tried on Ambien and amitriptyline. She has difficulty with sleep onset and sleep maintenance.  She has occasional urinary incontinence, occasional constipation. She snores, and needed to have a sleep study, but did not go. She has had some dream  enactments and has slid out of bed.  She had been very opposed to going into assisted living, but understood that she given her complex medical history and multiple issues and advanced Parkinson's disease she was no longer safe to live by herself.   Review of Systems:  Out of a complete 14 point review of systems, all are reviewed and negative with the exception of these symptoms as listed below:  Frequency in urination, urgency, back pain, gait problem, neck pain and neck stiffness. itching, tremors, numbness. Insomnia confusion. The patient is nervous/anxious.   ALLERGIES: Allergies  Allergen Reactions  . Ivp Dye [Iodinated Diagnostic Agents]     Only when intravenous, not on external skin.  . Clindamycin Rash    Unclear whether patient has an actual allergy to clindamycin. On beta-lactam at the same time.    HOME MEDICATIONS: Outpatient Prescriptions Prior to Visit  Medication Sig Dispense Refill  . acetaminophen (TYLENOL) 500 MG tablet Take 500 mg by mouth every 6 (six) hours as needed for mild pain.       Marland Kitchen  AMBULATORY NON FORMULARY MEDICATION Prilosec 20.6 Sig: Take one tablet by mouth once daily for stomach  30 capsule  5  . aspirin 81 MG EC tablet Take 1 tablet (81 mg total) by mouth daily.  30 tablet  0  . carbidopa-levodopa-entacapone (STALEVO) 37.5-150-200 MG per tablet Take 1 tablet by mouth 4 (four) times daily. At 8 AM, 12 PM, 4 PM, and 8 PM  360 tablet  2  . Cholecalciferol (VITAMIN D3) 2000 UNITS TABS Take 2,000 Units by mouth daily.       . diclofenac sodium (VOLTAREN) 1 % GEL Apply 4 g topically 2 (two) times daily. for knee pain      . furosemide (LASIX) 20 MG tablet Take 20 mg by mouth. As needed or if weight gain is more than 3 lbs in one day      . hydrALAZINE (APRESOLINE) 25 MG tablet TAKE 1 TABLET BY MOUTH 3 TIMES A DAY  90 tablet  0  . levothyroxine (SYNTHROID, LEVOTHROID) 25 MCG tablet Take 1 tablet (25 mcg total) by mouth daily before breakfast.  90 tablet  3  .  Melatonin 10 MG CAPS Take 10 mg by mouth. Daily at night, around 8:00 pm      . metoprolol succinate (TOPROL-XL) 25 MG 24 hr tablet Take 50 mg by mouth daily.      Marland Kitchen nystatin (MYCOSTATIN) powder Apply topically at bedtime.      Marland Kitchen omeprazole (PRILOSEC) 20 MG capsule Take 1 capsule (20 mg total) by mouth daily.  30 capsule  0  . potassium chloride SA (K-DUR,KLOR-CON) 20 MEQ tablet Take 1 tablet (20 mEq total) by mouth every 3 (three) days. Use  Every other day.  30 tablet  0  . ranitidine (ZANTAC) 150 MG tablet Take 150 mg by mouth as needed for heartburn. Take 1 tablet by mouth as needed for acid reflux.      . senna (SENOKOT) 8.6 MG tablet Take 1 tablet by mouth daily as needed for constipation.       . traMADol (ULTRAM) 50 MG tablet Take 1 tablet (50 mg total) by mouth every 8 (eight) hours as needed.  90 tablet  0  . carbidopa-levodopa (PARCOPA) 25-100 MG per disintegrating tablet Take 1/2 pill under the tongue up to twice daily.  30 tablet  3  . mupirocin ointment (BACTROBAN) 2 % APPLY 1 APPLICATION TOPICALLY 2 (TWO) TIMES DAILY.  22 g  1   No facility-administered medications prior to visit.    PHYSICAL EXAM  Filed Vitals:   07/13/13 0850  BP: 157/68  Pulse: 64   Cannot calculate BMI with a height equal to zero. No exam data present  General Examination: The patient is a very pleasant 80 y.o. female in no acute distress. She is obese. She appears frail and deconditioned. She is able to provide her history better today.  HEENT: Normocephalic, atraumatic, pupils are equal, round and reactive to light and accommodation. Extraocular tracking shows moderate saccadic breakdown without nystagmus noted. There is limitation to upper gaze. There is mild decrease in eye blink rate. Hearing is impaired mildly. Funduscopic exam is normal, but there is evidence of cataract bilaterally. Face is symmetric with moderate facial masking and normal facial sensation. There is no lip, neck or jaw tremor. Neck  is moderately rigid with intact passive ROM. There are no carotid bruits on auscultation. Oropharynx exam reveals moderate mouth dryness. There is significant airway crowding noted d/t redundant soft palate and large tongue.  She is edentulous. Mallampati is class III. Tongue protrudes centrally and palate elevates symmetrically.  Chest: is clear to auscultation without wheezing, rhonchi or crackles noted.  Heart: sounds are regular and normal without murmurs, rubs or gallops noted.  Abdomen: is soft, non-tender and non-distended with normal bowel sounds appreciated on auscultation.  Extremities: There is 2+ pitting edema in the distal lower extremities bilaterally. Pedal pulses are intact. Chronic stasis-like changes are noted in the distal legs bilaterally with s/p skin grafting on the right. There are no varicose veins.  Skin: is warm and dry with no trophic changes noted. Age-related changes are noted on the skin.  Musculoskeletal: exam reveals no obvious joint deformities, tenderness, joint swelling or erythema.  Neurologically:  Mental status: The patient is awake and alert, paying good attention. She is able to partially provide the history. Her daughter provides details and most of the entire history. She is oriented to: person, place, time/date and situation. Her memory, attention, language and knowledge are impaired mildly. There is no aphasia, agnosia, apraxia or anomia. There is a moderate degree of bradyphrenia. Speech is moderately hypophonic with mild dysarthria noted, however speech is edentulous as well. Mood is congruent and affect is normal.  Cranial nerves are as described above under HEENT exam. In addition, shoulder shrug is normal with equal shoulder height noted.  Motor exam: Normal bulk, and strength for age is noted. There are no dyskinesias noted.  Tone is mildly rigid with presence of cogwheeling in the right>left upper extremity and in both LEs. There is overall moderate  bradykinesia. There is no drift or rebound.  There is an intermittent mild to moderate UE resting tremor on the right and a slight intermittent resting tremor in the left upper extremity, not much changed.  Romberg is not tested.  Reflexes are 1+ in the upper extremities and trace in the knees and absent in the ankle.  Fine motor skills exam: Finger taps are moderately impaired on the right and mild to moderately impaired on the left. Hand movements are moderately impaired on the right and mild to moderately impaired on the left. RAP (rapid alternating patting) is mildly impaired on the right and mildly impaired on the left. Foot taps are moderate to severely impaired on the right and moderately impaired on the left. Foot agility (in the form of heel stomping) is moderately impaired on the right and moderately impaired on the left.  Cerebellar testing shows no dysmetria or intention tremor on finger to nose testing. Heel to shin is not possible for her. There is no truncal or gait ataxia.  Sensory exam is intact to light touch, pinprick, vibration, temperature sense in the upper and lower extremities.  Gait, station and balance: She stands up from the seated position with significant difficulty and needs to push herself up with her hands and also requires assistance. No veers backwards and to the right side. She is noted to lean to the R side. Posture is moderately stooped. Stance is wide-based. She walks with significant decrease in stride length and pace and has to rely on her rolling walker which she brought in today, but maneuvers it well. Tandem walk is not possible. Balance is moderately impaired. She is not able to do a toe or heel stance. She has mild trouble turning.    ASSESSMENT AND PLAN In summary, Satori Krabill is a very pleasant 80 year old female with an underlying medical history of spinal stenosis, hypothyroidism, osteoarthritis, hypertension, depression, obesity, necrotizing fasciitis  of her right leg, history of cellulitis of her jaw, who presents for FU consultation of her advanced, right sided predominant Parkinson's, complicated by hallucinations, anxiety, depression, insomnia and RBD, lower extremity swelling and overall deconditioning. She has been in assisted living since late September 2014, and has had a reasonable transition overall. Her situation is complicated because of advanced age, side effects with medications and medication increases in the past and progression of her parkinsonian signs and symptoms. On my exam she does have a mild degree of progression and she reports episodes of rigidity and freezing. I again spent a long time with the patient and her daughter today, discussing her symptoms, her advanced Parkinson's disease, her complications, her fall risk, her mood disorder and her sleep disorder. I suggested that we keep her Stalevo 150 it 4 times a day, at 8, 12, 4 PM and 8 PM. Previously she had increase in confusion and hallucinations as we increased the Stalevo to 5 times a day.   Nevertheless, I think she may benefit from a small increase in levodopa as needed by increasing Parcopa 25-100 mg strength 1 pill up to twice daily as needed.  She should be able to take this on her own and I will make a note in her chart that she should be able to self dispense this as needed medication to help her with the episodic freezing and increase in rigidity. She is advised that this may take some time to kick in and it is worth trying. I think the dose is low enough that we can get away with it without increasing side effects but she still is at risk for increasing confusion, lethargy and hallucinations. I provided her with a new prescription for Parcopa. We will continue Stalevo 150 mg 4 times a day at the current times, unchanged.  She has an appointment with [name] in October.  I answered all their questions today and the patient and her daughter were in agreement. I  encouraged them to call with any questions, concerns, or problems in the interim.   Meds ordered this encounter  Medications  . carbidopa-levodopa (PARCOPA) 25-100 MG per disintegrating tablet    Sig: Take 1 pill under the tongue up to twice daily.    Dispense:  60 tablet    Refill:  3    Order Specific Question:  Supervising Nohlan Burdin    Answer:  Star Age [[id number]]   Rudi Rummage LAM, MSN, NP-C 07/13/2013, 10:24 AM [facility] [address] [place], [place] [address] [phone]  Note: This document was prepared with digital dictation and possible smart phrase technology. Any transcriptional errors that result from this process are unintentional.

## 2013-07-14 ENCOUNTER — Ambulatory Visit (INDEPENDENT_AMBULATORY_CARE_PROVIDER_SITE_OTHER): Payer: Medicare Other | Admitting: Internal Medicine

## 2013-07-14 ENCOUNTER — Encounter: Payer: Self-pay | Admitting: Internal Medicine

## 2013-07-14 VITALS — BP 180/86 | HR 86 | Temp 98.0°F | Wt 221.0 lb

## 2013-07-14 DIAGNOSIS — E034 Atrophy of thyroid (acquired): Secondary | ICD-10-CM

## 2013-07-14 DIAGNOSIS — G20 Parkinson's disease: Secondary | ICD-10-CM

## 2013-07-14 DIAGNOSIS — F0280 Dementia in other diseases classified elsewhere without behavioral disturbance: Secondary | ICD-10-CM

## 2013-07-14 DIAGNOSIS — E038 Other specified hypothyroidism: Secondary | ICD-10-CM

## 2013-07-14 DIAGNOSIS — R209 Unspecified disturbances of skin sensation: Secondary | ICD-10-CM

## 2013-07-14 DIAGNOSIS — E0789 Other specified disorders of thyroid: Secondary | ICD-10-CM

## 2013-07-14 DIAGNOSIS — R350 Frequency of micturition: Secondary | ICD-10-CM

## 2013-07-14 DIAGNOSIS — I509 Heart failure, unspecified: Secondary | ICD-10-CM

## 2013-07-14 DIAGNOSIS — G20A1 Parkinson's disease without dyskinesia, without mention of fluctuations: Secondary | ICD-10-CM

## 2013-07-14 DIAGNOSIS — I10 Essential (primary) hypertension: Secondary | ICD-10-CM

## 2013-07-14 DIAGNOSIS — R202 Paresthesia of skin: Secondary | ICD-10-CM

## 2013-07-14 DIAGNOSIS — M159 Polyosteoarthritis, unspecified: Secondary | ICD-10-CM

## 2013-07-14 DIAGNOSIS — IMO0001 Reserved for inherently not codable concepts without codable children: Secondary | ICD-10-CM

## 2013-07-14 DIAGNOSIS — I5032 Chronic diastolic (congestive) heart failure: Secondary | ICD-10-CM

## 2013-07-14 LAB — POCT URINALYSIS DIPSTICK

## 2013-07-14 NOTE — Progress Notes (Signed)
Patient ID: Andrea Dorsey, female   DOB: [birth year], 80 y.o.   MRN: [id number]   Location:  [facility]  Code Status: DNR   Allergies  Allergen Reactions  . Ivp Dye [Iodinated Diagnostic Agents]     Only when intravenous, not on external skin.  . Clindamycin Rash    Unclear whether patient has an actual allergy to clindamycin. On beta-lactam at the same time.    Chief Complaint  Patient presents with  . Tingling    and weakness in legs.  Saw [name] 06/08/13 and Charlott Holler NP 07/13/13 for this, they feel this is Parkinson's related . Weith daughter Andrea Dorsey  . Urinary Frequency    urine    HPI: Patient is a 80 y.o. female seen in the office today for medical mgt of chronic diseases.   Mostly weakness and heaviness in the legs.  Andrea Luis, NP at [facility].   Maybe sleeping a little better--still wakes up early in the morning.  Using the melatonin 10mg  now.  Continued increased freezing.  No recent falls.   Has been going to therapy, but does not walk much b/c she is afraid of falling when she freezes.   Has not needed any lasix or potassium recently b/c her weight has stayed stable.  Breathing has not bothered her walking.   Knee pain helped with voltaren gel.  Took a couple tramadol last week.   Itching--rash better lately.  Has some moles under her breasts.   Low back is stable.  Does not talk about it--mostly weakness of lower back.   Andrea Dorsey is going to look into the therapy geared to parkinson's that Andrea Dorsey recommended.    Review of Systems:  Review of Systems  Constitutional: Negative for fever.  HENT: Negative for congestion.   Eyes: Negative for blurred vision.  Respiratory: Negative for cough, shortness of breath and wheezing.   Cardiovascular: Negative for chest pain and leg swelling.       Some chronic nonpitting edema  Gastrointestinal: Negative for abdominal pain.  Genitourinary: Positive for dysuria, urgency and  frequency.  Musculoskeletal: Positive for back pain. Negative for falls and myalgias.  Skin: Negative for itching and rash.  Neurological: Positive for tingling and sensory change. Negative for dizziness, focal weakness, loss of consciousness and headaches.  Endo/Heme/Allergies: Bruises/bleeds easily.  Psychiatric/Behavioral: Positive for depression and memory loss. The patient does not have insomnia.        Sleeping better     Past Medical History  Diagnosis Date  . Benign essential hypertension   . Hypothyroidism   . Osteoarthritis, generalized   . Parkinson disease   . Spinal stenosis   . History of necrotizing fasciitis     left leg, s/p debridement and graft  . Dementia in Parkinson's disease   . Depression     Past Surgical History  Procedure Laterality Date  . Skin debridement  2014    Brambhelt, MD  . Skin graft  2014    Brambhelt MD  . Spine surgery  2006    spinal stenosis  . Abdominal hysterectomy  1977    Social History:   reports that she has never smoked. She has never used smokeless tobacco. She reports that she does not drink alcohol or use illicit drugs.  Family History  Problem Relation Age of Onset  . Heart disease Mother   . Heart disease Sister   . Hypertension Sister   . Stroke Sister   .  Heart disease Sister     heart attack  . Cancer Sister     colon    Medications: Patient's Medications  New Prescriptions   No medications on file  Previous Medications   ACETAMINOPHEN (TYLENOL) 500 MG TABLET    Take 500 mg by mouth every 6 (six) hours as needed for mild pain.    ASPIRIN 81 MG EC TABLET    Take 1 tablet (81 mg total) by mouth daily.   CARBIDOPA-LEVODOPA (PARCOPA) 25-100 MG PER DISINTEGRATING TABLET    Take 1 pill under the tongue up to twice daily.   CARBIDOPA-LEVODOPA-ENTACAPONE (STALEVO) 37.5-150-200 MG PER TABLET    Take 1 tablet by mouth 4 (four) times daily. At 8 AM, 12 PM, 4 PM, and 8 PM   CHOLECALCIFEROL (VITAMIN D3) 2000 UNITS  TABS    Take 2,000 Units by mouth daily.    DICLOFENAC SODIUM (VOLTAREN) 1 % GEL    Apply 4 g topically 2 (two) times daily. for knee pain   FUROSEMIDE (LASIX) 20 MG TABLET    Take 20 mg by mouth. As needed or if weight gain is more than 3 lbs in one day   GUAIFENESIN (ROBITUSSIN) 100 MG/5ML SYRUP    Take 200 mg by mouth 3 (three) times daily as needed for cough.   HYDRALAZINE (APRESOLINE) 25 MG TABLET    TAKE 1 TABLET BY MOUTH 3 TIMES A DAY   LEVOTHYROXINE (SYNTHROID, LEVOTHROID) 25 MCG TABLET    Take 1 tablet (25 mcg total) by mouth daily before breakfast.   MELATONIN 10 MG CAPS    Take 10 mg by mouth. Daily at night, around 8:00 pm   METOPROLOL SUCCINATE (TOPROL-XL) 25 MG 24 HR TABLET    Take 50 mg by mouth daily.   NYSTATIN (MYCOSTATIN) POWDER    Apply topically at bedtime.   OMEPRAZOLE (PRILOSEC) 20 MG CAPSULE    Take 1 capsule (20 mg total) by mouth daily.   POTASSIUM CHLORIDE SA (K-DUR,KLOR-CON) 20 MEQ TABLET    Take 1 tablet (20 mEq total) by mouth every 3 (three) days. Use  Every other day.   PSEUDOEPHEDRINE-DM-GG (ROBITUSSIN CF PO)    Take by mouth. 37ml at bedtime a needed   RANITIDINE (ZANTAC) 150 MG TABLET    Take 150 mg by mouth as needed for heartburn. Take 1 tablet by mouth as needed for acid reflux.   SENNA (SENOKOT) 8.6 MG TABLET    Take 1 tablet by mouth daily as needed for constipation.    TRAMADOL (ULTRAM) 50 MG TABLET    Take 1 tablet (50 mg total) by mouth every 8 (eight) hours as needed.   TRIAMCINOLONE (KENALOG) 0.025 % CREAM    Apply 1 application topically as needed.  Modified Medications   No medications on file  Discontinued Medications   AMBULATORY NON FORMULARY MEDICATION    Prilosec 20.6 Sig: Take one tablet by mouth once daily for stomach     Physical Exam: Filed Vitals:   07/14/13 1556  BP: 180/86  Pulse: 86  Temp: 98 F (36.7 C)  TempSrc: Oral  Weight: 221 lb (100.245 kg)  Physical Exam  Constitutional: She appears well-developed and  well-nourished. No distress.  HENT:  Head: Normocephalic and atraumatic.  Cardiovascular: Normal rate, regular rhythm and normal heart sounds.   Pulmonary/Chest: Effort normal and breath sounds normal. She has no rales.  Abdominal: Soft. Bowel sounds are normal.  Neurological: She is alert. No cranial nerve deficit. She exhibits abnormal  muscle tone.  Oriented to person and place, not time; has cogwheeling rigidity and slow gait, no freezing witnessed today; using rollator walker  Skin: Skin is warm and dry.  Right lower leg with skin graft after necrotizing fasciitis  Psychiatric:  Flat affect    Labs reviewed: Basic Metabolic Panel:  Recent Labs  10/07/12 1032  11/08/12 1940  03/15/13 1524 03/30/13 1631 04/08/13 1054  NA 142  < >  --   < > 141 143 144  K 4.2  < >  --   < > 4.6 5.3* 4.8  CL 104  < >  --   < > 103 103 103  CO2 22  < >  --   < > 23 25 23   GLUCOSE 105*  < >  --   < > 111* 97 123*  BUN 12  < >  --   < > 16 21 18   CREATININE 1.12*  < > 1.05  < > 0.96 1.23* 1.20*  CALCIUM 9.9  < >  --   < > 10.0 10.4* 10.9*  TSH 3.700  --  4.929*  --   --  2.650  --   < > = values in this interval not displayed. Liver Function Tests:  Recent Labs  10/07/12 1032  03/13/13 1105 03/15/13 1524 03/30/13 1631 04/08/13 1054  AST 15  --  21 16 16 17   ALT 7  --  5 5 3 7   ALKPHOS 91  --  99 93 108 107  BILITOT 0.9  --  0.9 0.8 0.7 1.0  PROT 6.2  < > 7.3 7.0 6.5 7.3  ALBUMIN  --   --  3.6 3.5  --   --   < > = values in this interval not displayed. CBC:  Recent Labs  03/13/13 1100 03/15/13 1524 03/30/13 1631 04/08/13 1054  WBC 6.5 7.1 6.8 6.4  NEUTROABS 4.4 4.4 3.6 4.5  HGB 15.1* 15.7* 15.3 16.7*  HCT 43.7 45.6 45.9 49.4*  MCV 85.7 86.2 87 88  PLT 201 222 231  --    Lipid Panel:  Recent Labs  10/07/12 1032  HDL 59  LDLCALC 133*  TRIG 74  CHOLHDL 3.5   Lab Results  Component Value Date   HGBA1C 5.6 10/07/2012   Assessment/Plan 1. Frequency - is  chronic - POC Urinalysis Dipstick  2. Dementia in Parkinson's disease - seems improved lately cognitively  - CBC With differential/Platelet - Comprehensive metabolic panel  3. Chronic diastolic CHF (congestive heart failure) -stable and has not needed her prn lasix and kcl recently -f/u electrolytes  4. Essential hypertension, benign -bp at goal with current meds given h/o orthostatic hypotension and her fall risk  5. Hypothyroidism due to acquired atrophy of thyroid -cont current synthroid - TSH  6. Parkinson disease -cont stalevo and parcopa per neurology--parcopa was just increased to a full tablets needed for the freezing  7. Osteoarthritis, generalized -does well with voltaren gel  8. Tingling in extremities -thought to be PD related, r/o other causes with f/u labs: - Hemoglobin A1c - TSH  Labs/tests ordered:   Orders Placed This Encounter  Procedures  . CBC With differential/Platelet  . Comprehensive metabolic panel  . Hemoglobin A1c  . TSH  . POC Urinalysis Dipstick   Next appt:  3 mos

## 2013-07-15 ENCOUNTER — Emergency Department (HOSPITAL_COMMUNITY)
Admission: EM | Admit: 2013-07-15 | Discharge: 2013-07-15 | Disposition: A | Discharge status: Home / Self Care | Payer: Medicare Other | Attending: Emergency Medicine | Admitting: Emergency Medicine

## 2013-07-15 ENCOUNTER — Encounter: Payer: Self-pay | Admitting: *Deleted

## 2013-07-15 DIAGNOSIS — E039 Hypothyroidism, unspecified: Secondary | ICD-10-CM | POA: Insufficient documentation

## 2013-07-15 DIAGNOSIS — F0280 Dementia in other diseases classified elsewhere without behavioral disturbance: Secondary | ICD-10-CM | POA: Insufficient documentation

## 2013-07-15 DIAGNOSIS — F3289 Other specified depressive episodes: Secondary | ICD-10-CM | POA: Insufficient documentation

## 2013-07-15 DIAGNOSIS — Z7982 Long term (current) use of aspirin: Secondary | ICD-10-CM | POA: Insufficient documentation

## 2013-07-15 DIAGNOSIS — G20A1 Parkinson's disease without dyskinesia, without mention of fluctuations: Secondary | ICD-10-CM | POA: Insufficient documentation

## 2013-07-15 DIAGNOSIS — Z791 Long term (current) use of non-steroidal anti-inflammatories (NSAID): Secondary | ICD-10-CM | POA: Insufficient documentation

## 2013-07-15 DIAGNOSIS — G20 Parkinson's disease: Secondary | ICD-10-CM | POA: Insufficient documentation

## 2013-07-15 DIAGNOSIS — M159 Polyosteoarthritis, unspecified: Secondary | ICD-10-CM | POA: Insufficient documentation

## 2013-07-15 DIAGNOSIS — E669 Obesity, unspecified: Secondary | ICD-10-CM | POA: Insufficient documentation

## 2013-07-15 DIAGNOSIS — I10 Essential (primary) hypertension: Secondary | ICD-10-CM | POA: Insufficient documentation

## 2013-07-15 DIAGNOSIS — Z79899 Other long term (current) drug therapy: Secondary | ICD-10-CM | POA: Insufficient documentation

## 2013-07-15 DIAGNOSIS — F329 Major depressive disorder, single episode, unspecified: Secondary | ICD-10-CM | POA: Insufficient documentation

## 2013-07-15 DIAGNOSIS — IMO0002 Reserved for concepts with insufficient information to code with codable children: Secondary | ICD-10-CM | POA: Insufficient documentation

## 2013-07-15 LAB — BASIC METABOLIC PANEL
BUN: 20 mg/dL (ref 6–23)
CALCIUM: 9.9 mg/dL (ref 8.4–10.5)
CHLORIDE: 104 meq/L (ref 96–112)
CO2: 23 meq/L (ref 19–32)
Creatinine, Ser: 1.01 mg/dL (ref 0.50–1.10)
GFR calc Af Amer: 58 mL/min — ABNORMAL LOW (ref >90)
GFR calc non Af Amer: 50 mL/min — ABNORMAL LOW (ref >90)
GLUCOSE: 114 mg/dL — AB (ref 70–99)
POTASSIUM: 3.9 meq/L (ref 3.7–5.3)
SODIUM: 141 meq/L (ref 137–147)

## 2013-07-15 LAB — CBC WITH DIFFERENTIAL
Basophils Absolute: 0.1 10*3/uL (ref 0.0–0.2)
Basos: 1 %
Eos: 4 %
Eosinophils Absolute: 0.2 10*3/uL (ref 0.0–0.4)
HCT: 45 % (ref 34.0–46.6)
Hemoglobin: 14.9 g/dL (ref 11.1–15.9)
Immature Grans (Abs): 0 10*3/uL (ref 0.0–0.1)
Immature Granulocytes: 0 %
Lymphocytes Absolute: 1.6 10*3/uL (ref 0.7–3.1)
Lymphs: 28 %
MCH: 28.8 pg (ref 26.6–33.0)
MCHC: 33.1 g/dL (ref 31.5–35.7)
MCV: 87 fL (ref 79–97)
Monocytes Absolute: 0.9 10*3/uL (ref 0.1–0.9)
Monocytes: 15 %
Neutrophils Absolute: 3.1 10*3/uL (ref 1.4–7.0)
Neutrophils Relative %: 52 %
Platelets: 218 10*3/uL (ref 150–379)
RBC: 5.17 x10E6/uL (ref 3.77–5.28)
RDW: 14.6 % (ref 12.3–15.4)
WBC: 5.9 10*3/uL (ref 3.4–10.8)

## 2013-07-15 LAB — COMPREHENSIVE METABOLIC PANEL
ALT: 6 IU/L (ref 0–32)
AST: 16 IU/L (ref 0–40)
Albumin/Globulin Ratio: 1.9 (ref 1.1–2.5)
Albumin: 4.2 g/dL (ref 3.5–4.7)
Alkaline Phosphatase: 101 IU/L (ref 39–117)
BUN/Creatinine Ratio: 16 (ref 11–26)
BUN: 21 mg/dL (ref 8–27)
CO2: 23 mmol/L (ref 18–29)
Calcium: 10.3 mg/dL (ref 8.7–10.3)
Chloride: 103 mmol/L (ref 97–108)
Creatinine, Ser: 1.32 mg/dL — ABNORMAL HIGH (ref 0.57–1.00)
GFR calc Af Amer: 43 mL/min/{1.73_m2} — ABNORMAL LOW (ref >59)
GFR calc non Af Amer: 38 mL/min/{1.73_m2} — ABNORMAL LOW (ref >59)
Globulin, Total: 2.2 g/dL (ref 1.5–4.5)
Glucose: 106 mg/dL — ABNORMAL HIGH (ref 65–99)
Potassium: 5.1 mmol/L (ref 3.5–5.2)
Sodium: 144 mmol/L (ref 134–144)
Total Bilirubin: 0.9 mg/dL (ref 0.0–1.2)
Total Protein: 6.4 g/dL (ref 6.0–8.5)

## 2013-07-15 LAB — CBG MONITORING, ED: Glucose-Capillary: 120 mg/dL — ABNORMAL HIGH (ref 70–99)

## 2013-07-15 LAB — HEMOGLOBIN A1C
Est. average glucose Bld gHb Est-mCnc: 128 mg/dL
Hgb A1c MFr Bld: 6.1 % — ABNORMAL HIGH (ref 4.8–5.6)

## 2013-07-15 LAB — URINE MICROSCOPIC-ADD ON

## 2013-07-15 LAB — TSH: TSH: 4.06 u[IU]/mL (ref 0.450–4.500)

## 2013-07-15 LAB — URINALYSIS, ROUTINE W REFLEX MICROSCOPIC
BILIRUBIN URINE: NEGATIVE
GLUCOSE, UA: NEGATIVE mg/dL
Hgb urine dipstick: NEGATIVE
Ketones, ur: NEGATIVE mg/dL
Leukocytes, UA: NEGATIVE
NITRITE: POSITIVE — AB
PH: 6.5 (ref 5.0–8.0)
Protein, ur: 100 mg/dL — AB
Specific Gravity, Urine: 1.019 (ref 1.005–1.030)
Urobilinogen, UA: 0.2 mg/dL (ref 0.0–1.0)

## 2013-07-15 LAB — CBC
HCT: 43.4 % (ref 36.0–46.0)
HEMOGLOBIN: 14.5 g/dL (ref 12.0–15.0)
MCH: 28.9 pg (ref 26.0–34.0)
MCHC: 33.4 g/dL (ref 30.0–36.0)
MCV: 86.6 fL (ref 78.0–100.0)
Platelets: 191 10*3/uL (ref 150–400)
RBC: 5.01 MIL/uL (ref 3.87–5.11)
RDW: 14.5 % (ref 11.5–15.5)
WBC: 5.1 10*3/uL (ref 4.0–10.5)

## 2013-07-15 LAB — I-STAT TROPONIN, ED: Troponin i, poc: 0.01 ng/mL (ref 0.00–0.08)

## 2013-07-15 NOTE — Discharge Instructions (Signed)
After discussion with neurologist, we recommend increasing Parcopa to twice a day. Followup with your regular neurologist

## 2013-07-15 NOTE — ED Provider Notes (Signed)
CSN: [id number]     Arrival date & time 07/15/13  6073 History   First MD Initiated Contact with Patient 07/15/13 [phone]     Chief Complaint  Patient presents with  . Weakness     (Consider location/radiation/quality/duration/timing/severity/associated sxs/prior Treatment) HPI.... level V caveat for mild dementia secondary to Parkinson disease.   Patient complains of generalized feeling of "feeling frozen can't move".  The symptoms are intermittent.  Daughter reports this is most likely related to her Parkinson's disease.  No chest pain, dyspnea, fever, chills, dysuria. Severity is mild to moderate. She is presently taking Stalero 37.5/150/200 QID and Parcopa 25/100 one tab bid prn.  Past Medical History  Diagnosis Date  . Benign essential hypertension   . Hypothyroidism   . Osteoarthritis, generalized   . Parkinson disease   . Spinal stenosis   . History of necrotizing fasciitis     left leg, s/p debridement and graft  . Dementia in Parkinson's disease   . Depression    Past Surgical History  Procedure Laterality Date  . Skin debridement  2014    Brambhelt, MD  . Skin graft  2014    Brambhelt MD  . Spine surgery  2006    spinal stenosis  . Abdominal hysterectomy  1977   Family History  Problem Relation Age of Onset  . Heart disease Mother   . Heart disease Sister   . Hypertension Sister   . Stroke Sister   . Heart disease Sister     heart attack  . Cancer Sister     colon   History  Substance Use Topics  . Smoking status: Never Smoker   . Smokeless tobacco: Never Used  . Alcohol Use: No   OB History   Grav Para Term Preterm Abortions TAB SAB Ect Mult Living                 Review of Systems  Unable to perform ROS: Other      Allergies  Ivp dye and Clindamycin  Home Medications   Prior to Admission medications   Medication Sig Start Date End Date Taking? Authorizing Provider  acetaminophen (TYLENOL) 500 MG tablet Take 500 mg by mouth every 6 (six)  hours as needed for mild pain.    Yes Historical Provider, MD  aspirin 81 MG EC tablet Take 1 tablet (81 mg total) by mouth daily. 11/11/12  Yes Barton Dubois, MD  carbidopa-levodopa-entacapone (STALEVO) 37.5-150-200 MG per tablet Take 1 tablet by mouth 4 (four) times daily. At 8 AM, 12 PM, 4 PM, and 8 PM 04/08/13  Yes Philmore Pali, NP  Cholecalciferol (VITAMIN D3) 2000 UNITS TABS Take 2,000 Units by mouth daily.    Yes Historical Provider, MD  diclofenac sodium (VOLTAREN) 1 % GEL Apply 4 g topically 2 (two) times daily. for knee pain 02/09/13  Yes Mahima Pandey, MD  furosemide (LASIX) 20 MG tablet Take 20 mg by mouth. As needed or if weight gain is more than 3 lbs in one day   Yes Historical Provider, MD  guaifenesin (ROBITUSSIN) 100 MG/5ML syrup Take 200 mg by mouth 3 (three) times daily as needed for cough.   Yes Historical Provider, MD  hydrALAZINE (APRESOLINE) 25 MG tablet Take 25 mg by mouth 3 (three) times daily.   Yes Historical Provider, MD  levothyroxine (SYNTHROID, LEVOTHROID) 25 MCG tablet Take 1 tablet (25 mcg total) by mouth daily before breakfast. 02/09/13  Yes Blanchie Serve, MD  Melatonin 10 MG CAPS  Take 10 mg by mouth. Daily at night, around 8:00 pm   Yes Historical Provider, MD  metoprolol succinate (TOPROL-XL) 25 MG 24 hr tablet Take 50 mg by mouth daily. 02/09/13  Yes Mahima Pandey, MD  nystatin (MYCOSTATIN) powder Apply topically at bedtime.   Yes Historical Provider, MD  omeprazole (PRILOSEC) 20 MG capsule Take 1 capsule (20 mg total) by mouth daily. 05/12/13  Yes Tiffany L Reed, DO  potassium chloride SA (K-DUR,KLOR-CON) 20 MEQ tablet Take 1 tablet (20 mEq total) by mouth every 3 (three) days. Use  Every other day. 05/12/13  Yes Tiffany L Reed, DO  ranitidine (ZANTAC) 150 MG tablet Take 150 mg by mouth as needed for heartburn. Take 1 tablet by mouth as needed for acid reflux.   Yes Historical Provider, MD  senna-docusate (SENOKOT-S) 8.6-50 MG per tablet Take 2 tablets by mouth daily as  needed for mild constipation.   Yes Historical Provider, MD  traMADol (ULTRAM) 50 MG tablet Take 50 mg by mouth every 8 (eight) hours as needed for moderate pain (knee).   Yes Historical Provider, MD  triamcinolone (KENALOG) 0.025 % cream Apply 1 application topically as needed.   Yes Historical Provider, MD   BP 176/64  Pulse 51  Temp(Src) 97.9 F (36.6 C)  Resp 12  SpO2 98% Physical Exam  Nursing note and vitals reviewed. Constitutional: She is oriented to person, place, and time.  Obese, sluggish movement, no acute distress  HENT:  Head: Normocephalic and atraumatic.  Eyes: Conjunctivae and EOM are normal. Pupils are equal, round, and reactive to light.  Neck: Normal range of motion. Neck supple.  Cardiovascular: Normal rate, regular rhythm and normal heart sounds.   Pulmonary/Chest: Effort normal and breath sounds normal.  Abdominal: Soft. Bowel sounds are normal.  Musculoskeletal: Normal range of motion.  Neurological: She is alert and oriented to person, place, and time.  Skin: Skin is warm and dry.  Psychiatric: She has a normal mood and affect. Her behavior is normal.    ED Course  Procedures (including critical care time) Labs Review Labs Reviewed  BASIC METABOLIC PANEL - Abnormal; Notable for the following:    Glucose, Bld 114 (*)    GFR calc non Af Amer 50 (*)    GFR calc Af Amer 58 (*)    All other components within normal limits  URINALYSIS, ROUTINE W REFLEX MICROSCOPIC - Abnormal; Notable for the following:    Protein, ur 100 (*)    Nitrite POSITIVE (*)    All other components within normal limits  URINE MICROSCOPIC-ADD ON - Abnormal; Notable for the following:    Bacteria, UA MANY (*)    All other components within normal limits  CBG MONITORING, ED - Abnormal; Notable for the following:    Glucose-Capillary 120 (*)    All other components within normal limits  CBC  I-STAT TROPOININ, ED    Imaging Review No results found.   EKG  Interpretation   Date/Time:  Friday July 15 2013 03:50:54 EDT Ventricular Rate:  60 PR Interval:  157 QRS Duration: 85 QT Interval:  422 QTC Calculation: 422 R Axis:   25 Text Interpretation:  Sinus rhythm Probable left atrial enlargement  Nonspecific repol abnormality, inferior leads Confirmed by OTTER  MD, OLGA  ([id number]) on 07/15/2013 5:15:53 AM      MDM   Final diagnoses:  Parkinson disease    Patient is nontoxic. I suspect this is an exacerbation of her Parkinson's. Daughter agrees. Discussed with  neurologist on call. Will increase Parcopa 25/100 to bid everyday.  Discussed with daughter and patient    Nat Christen, MD 07/15/13 [phone]

## 2013-07-15 NOTE — ED Notes (Signed)
Ordered breakfast for patient per daughter request.  Okay'd by Dr. Lacinda Axon.

## 2013-07-15 NOTE — ED Notes (Signed)
Regular Diet ordered

## 2013-07-15 NOTE — ED Notes (Signed)
PER EMS: pt from [place] home with complaints of generalized weakness, hx of parkinsons disease. Pt unable to walk or stand and difficulty moving legs and this is not normal for her. BP-196/86, HR-68, CBG-102, 97% RA. Pt A&Ox4.

## 2013-07-24 ENCOUNTER — Other Ambulatory Visit: Payer: Self-pay | Admitting: Internal Medicine

## 2013-07-25 ENCOUNTER — Telehealth: Payer: Self-pay | Admitting: *Deleted

## 2013-07-25 NOTE — Telephone Encounter (Signed)
Daughter called to inform Dr. [name] that patient had redness and tenderness to lower portion of legs. Dr. [name] requested that someone see her, scheduled appointment with Dr. Bubba Camp for 07/26/2013@3 :45p. LM on daughter work phone to call back today if they can't keep the appointment.

## 2013-07-26 ENCOUNTER — Ambulatory Visit: Payer: Self-pay | Admitting: Internal Medicine

## 2013-07-28 ENCOUNTER — Telehealth: Payer: Self-pay | Admitting: *Deleted

## 2013-07-28 ENCOUNTER — Other Ambulatory Visit: Payer: Self-pay | Admitting: *Deleted

## 2013-07-28 MED ORDER — TRAMADOL HCL 50 MG PO TABS: 50.0000 mg | ORAL_TABLET | Freq: Three times a day (TID) | ORAL | Status: DC | PRN

## 2013-07-28 NOTE — Telephone Encounter (Signed)
Pt's daughter, [name], first called earlier today regarding RT lower leg swelling, then she went to her Dermatologist appt and was advised to be seen by PCP, and they showed up here at the office about 2:15 pm wanting to be seen.  Due to schedule being completely full, pt's leg was looked at by [facility] Gauss) then reported back to Dr [name]. Per Dr [name], since Dermatology gave her an RX for Doxycycline qty #28, she advised to have them fill that RX and appt was scheduled for July 15th @ 12:45 with Dr Nyoka Cowden to check progress on her leg swelling.  [name] & pt  was agreeable to decision of treatment and advised to call us if symptoms get worse.

## 2013-07-28 NOTE — Telephone Encounter (Signed)
[place]

## 2013-07-28 NOTE — Telephone Encounter (Signed)
[name] called and stated that patient's leg is swollen and very Tender. Appointment was made for last week but [name] stated that it was better and canceled appointment. States that it has gotten worse. Advised her to take her to the Urgent [place]. She Agreed.

## 2013-08-01 ENCOUNTER — Telehealth: Payer: Self-pay | Admitting: *Deleted

## 2013-08-01 NOTE — Telephone Encounter (Signed)
[name] called and stated that patient's leg is much better after taking the antibiotic. No redness, Swelling or tenderness. Wants to know if they should come in for the appointment tomorrow?  I spoke with Dr. [name] and she stated that if it was better they did not have to keep appointment. [name] Notified and appointment canceled

## 2013-08-03 ENCOUNTER — Ambulatory Visit: Payer: Medicare Other | Admitting: Internal Medicine

## 2013-08-15 ENCOUNTER — Telehealth: Payer: Self-pay | Admitting: Neurology

## 2013-08-15 MED ORDER — CARBIDOPA-LEVODOPA 25-100 MG PO TBDP: ORAL_TABLET | ORAL | Status: DC

## 2013-08-15 NOTE — Telephone Encounter (Signed)
Last OV note says:  Medications    .  carbidopa-levodopa (PARCOPA) 25-100 MG per disintegrating tablet     Sig: Take 1 pill under the tongue up to twice daily.     Dispense: 60 tablet     Refill: 3    Rx has been sent to [facility].  I called back.  Got no answer.  Left message.

## 2013-08-15 NOTE — Telephone Encounter (Signed)
Patient's daughter calling to state that she misplaced patient's written script for [place] (?), the medication that she places under the tongue and it dissolves, patient would like script [facility] to the [facility] on [address] and she also wants to clarify on the dosage, please return call and advise.

## 2013-08-18 ENCOUNTER — Ambulatory Visit: Payer: Medicare Other | Admitting: Internal Medicine

## 2013-09-01 ENCOUNTER — Emergency Department (HOSPITAL_COMMUNITY)
Admission: EM | Admit: 2013-09-01 | Discharge: 2013-09-01 | Disposition: A | Discharge status: Home / Self Care | Payer: Medicare Other | Attending: Emergency Medicine | Admitting: Emergency Medicine

## 2013-09-01 ENCOUNTER — Emergency Department (HOSPITAL_COMMUNITY): Payer: Medicare Other

## 2013-09-01 ENCOUNTER — Encounter (HOSPITAL_COMMUNITY): Payer: Self-pay | Admitting: Emergency Medicine

## 2013-09-01 DIAGNOSIS — I10 Essential (primary) hypertension: Secondary | ICD-10-CM | POA: Insufficient documentation

## 2013-09-01 DIAGNOSIS — G20 Parkinson's disease: Secondary | ICD-10-CM | POA: Insufficient documentation

## 2013-09-01 DIAGNOSIS — Z8739 Personal history of other diseases of the musculoskeletal system and connective tissue: Secondary | ICD-10-CM | POA: Insufficient documentation

## 2013-09-01 DIAGNOSIS — Z8669 Personal history of other diseases of the nervous system and sense organs: Secondary | ICD-10-CM

## 2013-09-01 DIAGNOSIS — Z872 Personal history of diseases of the skin and subcutaneous tissue: Secondary | ICD-10-CM | POA: Insufficient documentation

## 2013-09-01 DIAGNOSIS — F0390 Unspecified dementia without behavioral disturbance: Secondary | ICD-10-CM | POA: Insufficient documentation

## 2013-09-01 DIAGNOSIS — G20A1 Parkinson's disease without dyskinesia, without mention of fluctuations: Secondary | ICD-10-CM | POA: Insufficient documentation

## 2013-09-01 DIAGNOSIS — Z7982 Long term (current) use of aspirin: Secondary | ICD-10-CM | POA: Insufficient documentation

## 2013-09-01 DIAGNOSIS — E039 Hypothyroidism, unspecified: Secondary | ICD-10-CM | POA: Insufficient documentation

## 2013-09-01 DIAGNOSIS — Z791 Long term (current) use of non-steroidal anti-inflammatories (NSAID): Secondary | ICD-10-CM | POA: Insufficient documentation

## 2013-09-01 DIAGNOSIS — M159 Polyosteoarthritis, unspecified: Secondary | ICD-10-CM | POA: Diagnosis not present

## 2013-09-01 DIAGNOSIS — Z8659 Personal history of other mental and behavioral disorders: Secondary | ICD-10-CM | POA: Diagnosis not present

## 2013-09-01 DIAGNOSIS — R4182 Altered mental status, unspecified: Secondary | ICD-10-CM | POA: Insufficient documentation

## 2013-09-01 DIAGNOSIS — Z79899 Other long term (current) drug therapy: Secondary | ICD-10-CM | POA: Diagnosis not present

## 2013-09-01 LAB — CBC WITH DIFFERENTIAL/PLATELET
BASOS ABS: 0.1 10*3/uL (ref 0.0–0.1)
Basophils Relative: 1 % (ref 0–1)
EOS ABS: 0.3 10*3/uL (ref 0.0–0.7)
EOS PCT: 5 % (ref 0–5)
HEMATOCRIT: 44.4 % (ref 36.0–46.0)
Hemoglobin: 14.9 g/dL (ref 12.0–15.0)
Lymphocytes Relative: 28 % (ref 12–46)
Lymphs Abs: 1.5 10*3/uL (ref 0.7–4.0)
MCH: 28.8 pg (ref 26.0–34.0)
MCHC: 33.6 g/dL (ref 30.0–36.0)
MCV: 85.7 fL (ref 78.0–100.0)
MONO ABS: 0.7 10*3/uL (ref 0.1–1.0)
Monocytes Relative: 13 % — ABNORMAL HIGH (ref 3–12)
Neutro Abs: 3 10*3/uL (ref 1.7–7.7)
Neutrophils Relative %: 53 % (ref 43–77)
Platelets: 202 10*3/uL (ref 150–400)
RBC: 5.18 MIL/uL — ABNORMAL HIGH (ref 3.87–5.11)
RDW: 15 % (ref 11.5–15.5)
WBC: 5.6 10*3/uL (ref 4.0–10.5)

## 2013-09-01 LAB — URINE MICROSCOPIC-ADD ON

## 2013-09-01 LAB — COMPREHENSIVE METABOLIC PANEL
ALT: <5 U/L (ref 0–35)
AST: 17 U/L (ref 0–37)
Albumin: 3.2 g/dL — ABNORMAL LOW (ref 3.5–5.2)
Alkaline Phosphatase: 89 U/L (ref 39–117)
Anion gap: 12 (ref 5–15)
BUN: 18 mg/dL (ref 6–23)
CALCIUM: 9.4 mg/dL (ref 8.4–10.5)
CO2: 23 mEq/L (ref 19–32)
CREATININE: 1.07 mg/dL (ref 0.50–1.10)
Chloride: 109 mEq/L (ref 96–112)
GFR calc Af Amer: 54 mL/min — ABNORMAL LOW (ref >90)
GFR, EST NON AFRICAN AMERICAN: 47 mL/min — AB (ref >90)
Glucose, Bld: 105 mg/dL — ABNORMAL HIGH (ref 70–99)
Potassium: 4.3 mEq/L (ref 3.7–5.3)
Sodium: 144 mEq/L (ref 137–147)
TOTAL PROTEIN: 6.1 g/dL (ref 6.0–8.3)
Total Bilirubin: 1 mg/dL (ref 0.3–1.2)

## 2013-09-01 LAB — URINALYSIS, ROUTINE W REFLEX MICROSCOPIC
Bilirubin Urine: NEGATIVE
Glucose, UA: NEGATIVE mg/dL
Hgb urine dipstick: NEGATIVE
KETONES UR: NEGATIVE mg/dL
LEUKOCYTES UA: NEGATIVE
NITRITE: NEGATIVE
PH: 7.5 (ref 5.0–8.0)
Protein, ur: 30 mg/dL — AB
Specific Gravity, Urine: 1.007 (ref 1.005–1.030)
Urobilinogen, UA: 0.2 mg/dL (ref 0.0–1.0)

## 2013-09-01 LAB — TROPONIN I

## 2013-09-01 NOTE — Discharge Instructions (Signed)

## 2013-09-01 NOTE — ED Notes (Signed)
Report called to [facility] , Rose .  Informed her that pt.  Will be coming back to facility via her daughter. Vitals stable upon discharge.  Informed her to take BP regularly.

## 2013-09-01 NOTE — ED Notes (Signed)
Pt. Incontinent of urine. Pericare performed at this time.

## 2013-09-01 NOTE — ED Provider Notes (Signed)
CSN: [id number]     Arrival date & time 09/01/13  0436 History   First MD Initiated Contact with Patient 09/01/13 [phone]     Chief Complaint  Patient presents with  . Altered Mental Status     (Consider location/radiation/quality/duration/timing/severity/associated sxs/prior Treatment) HPI Comments: Patient is an 80 year old female with history of Parkinson's disease and dementia. She is brought by EMS from a nursing home for evaluation of mental status change. She was last seen normal 3 hours prior to being brought here. She is normally awake, alert and talkative. This evening she became less talkative and less alert. She only tells me that "I feel funny". She otherwise has no complaints.  Patient is a 80 y.o. female presenting with altered mental status. The history is provided by the patient, the EMS personnel and the nursing home.  Altered Mental Status Presenting symptoms: partial responsiveness   Severity:  Moderate Most recent episode:  Today Duration:  3 hours Timing:  Constant Progression:  Unchanged Chronicity:  New   Past Medical History  Diagnosis Date  . Benign essential hypertension   . Hypothyroidism   . Osteoarthritis, generalized   . Parkinson disease   . Spinal stenosis   . History of necrotizing fasciitis     left leg, s/p debridement and graft  . Dementia in Parkinson's disease   . Depression    Past Surgical History  Procedure Laterality Date  . Skin debridement  2014    Brambhelt, MD  . Skin graft  2014    Brambhelt MD  . Spine surgery  2006    spinal stenosis  . Abdominal hysterectomy  1977   Family History  Problem Relation Age of Onset  . Heart disease Mother   . Heart disease Sister   . Hypertension Sister   . Stroke Sister   . Heart disease Sister     heart attack  . Cancer Sister     colon   History  Substance Use Topics  . Smoking status: Never Smoker   . Smokeless tobacco: Never Used  . Alcohol Use: No   OB History   Grav Para  Term Preterm Abortions TAB SAB Ect Mult Living                 Review of Systems  All other systems reviewed and are negative.     Allergies  Ivp dye and Clindamycin  Home Medications   Prior to Admission medications   Medication Sig Start Date End Date Taking? Authorizing Provider  acetaminophen (TYLENOL) 500 MG tablet Take 500 mg by mouth every 6 (six) hours as needed for mild pain.     Historical Provider, MD  aspirin 81 MG EC tablet Take 1 tablet (81 mg total) by mouth daily. 11/11/12   Barton Dubois, MD  carbidopa-levodopa (PARCOPA) 25-100 MG per disintegrating tablet Take 1 pill under the tongue up to twice daily. 08/15/13   Philmore Pali, NP  carbidopa-levodopa-entacapone (STALEVO) 37.5-150-200 MG per tablet Take 1 tablet by mouth 4 (four) times daily. At 8 AM, 12 PM, 4 PM, and 8 PM 04/08/13   Philmore Pali, NP  Cholecalciferol (VITAMIN D3) 2000 UNITS TABS Take 2,000 Units by mouth daily.     Historical Provider, MD  diclofenac sodium (VOLTAREN) 1 % GEL Apply 4 g topically 2 (two) times daily. for knee pain 02/09/13   Blanchie Serve, MD  furosemide (LASIX) 20 MG tablet Take 20 mg by mouth. As needed or if weight gain  is more than 3 lbs in one day    Historical Provider, MD  guaifenesin (ROBITUSSIN) 100 MG/5ML syrup Take 200 mg by mouth 3 (three) times daily as needed for cough.    Historical Provider, MD  hydrALAZINE (APRESOLINE) 25 MG tablet Take 25 mg by mouth 3 (three) times daily.    Historical Provider, MD  hydrALAZINE (APRESOLINE) 25 MG tablet TAKE 1 TABLET BY MOUTH 3 TIMES A DAY    Tiffany L Reed, DO  levothyroxine (SYNTHROID, LEVOTHROID) 25 MCG tablet Take 1 tablet (25 mcg total) by mouth daily before breakfast. 02/09/13   Blanchie Serve, MD  Melatonin 10 MG CAPS Take 10 mg by mouth. Daily at night, around 8:00 pm    Historical Provider, MD  metoprolol succinate (TOPROL-XL) 25 MG 24 hr tablet Take 50 mg by mouth daily. 02/09/13   Blanchie Serve, MD  nystatin (MYCOSTATIN) powder Apply  topically at bedtime.    Historical Provider, MD  omeprazole (PRILOSEC) 20 MG capsule Take 1 capsule (20 mg total) by mouth daily. 05/12/13   Tiffany L Reed, DO  potassium chloride SA (K-DUR,KLOR-CON) 20 MEQ tablet Take 1 tablet (20 mEq total) by mouth every 3 (three) days. Use  Every other day. 05/12/13   Tiffany L Reed, DO  ranitidine (ZANTAC) 150 MG tablet Take 150 mg by mouth as needed for heartburn. Take 1 tablet by mouth as needed for acid reflux.    Historical Provider, MD  senna-docusate (SENOKOT-S) 8.6-50 MG per tablet Take 2 tablets by mouth daily as needed for mild constipation.    Historical Provider, MD  traMADol (ULTRAM) 50 MG tablet Take 1 tablet (50 mg total) by mouth every 8 (eight) hours as needed for moderate pain (knee). 07/28/13   Tiffany L Reed, DO  triamcinolone (KENALOG) 0.025 % cream Apply 1 application topically as needed.    Historical Provider, MD   There were no vitals taken for this visit. Physical Exam  Nursing note and vitals reviewed. Constitutional: She appears well-developed.  Patient is an elderly female in no acute distress. She is awake but not talking.  HENT:  Head: Normocephalic and atraumatic.  Mouth/Throat: Oropharynx is clear and moist.  Eyes: EOM are normal. Pupils are equal, round, and reactive to light.  Neck: Normal range of motion. Neck supple.  Cardiovascular: Normal rate, regular rhythm and normal heart sounds.   No murmur heard. Pulmonary/Chest: Effort normal and breath sounds normal. No respiratory distress. She has no wheezes.  Abdominal: Soft. Bowel sounds are normal. She exhibits no distension. There is no tenderness.  Neurological:  Difficult to assess secondary to mental status. She has a parkinsonian tremor present in both arms. She is awake and is holding her eyes open. She will not respond verbally to questions.  Skin: Skin is warm and dry.    ED Course  Procedures (including critical care time) Labs Review Labs Reviewed - No data  to display  Imaging Review No results found.   EKG Interpretation   Date/Time:  Thursday September 01 2013 04:47:12 EDT Ventricular Rate:  67 PR Interval:    QRS Duration: 85 QT Interval:  393 QTC Calculation: 415 R Axis:     Text Interpretation:  Normal sinus rhythm Left ventricular hypertrophy  Confirmed by Beau Fanny  MD, Yuleidy Rappleye ([id number]) on 09/01/2013 6:59:46 AM      MDM   Final diagnoses:  None    Patient is an 80 year old female with history of Parkinson's disease. She is a resident of a nursing  home and was sent here for evaluation of mental status change. She was apparently less responsive while she was there and sent here to be evaluated. She denies to me she is having any symptoms at present. She was initially a little anxious when she showed up, however this seems to have resolved. Workup reveals no significant abnormality in her laboratory studies and CT scan of the head does not reveal any acute process. She is now awake, alert, and appropriate. She does have an elevated blood pressure, however I believe this is likely due to the fact that she is due to receive her medicines here in the next hour or 2. I feel as though she is appropriate for discharge, to return as needed for any problems.    Veryl Speak, MD 09/01/13 [phone]

## 2013-09-01 NOTE — ED Notes (Signed)
PER EMS: pt from [employer] at [facility] and staff checked on patient at [place] and she was at normal mentation and her BP was 193 systolic. Shortly after that pt began to have altered mental status and left sided weakness. Pt A&Ox4, speech is mumbled but is comprehensible. Dr. Stark Jock at bedside upon arrival of patient. Staff at nursing home report she is not at her normal baseline mentation.

## 2013-09-01 NOTE — ED Notes (Signed)
A-flutter on monitor, showed to Dr. Stark Jock. EKG previously completed. No new orders at this time.

## 2013-09-01 NOTE — ED Notes (Signed)
Pt. s daughter is at the bedside, She has requested to speak to the MD.  Dr. Stark Jock had left., Spoke with Irena Cords, He will let Dr. Regenia Skeeter know that pt.'s daughter has questions.  Dr. Stark Jock was aware of pt. Taking her own medications this am. He stated, :"I am okay with that."

## 2013-09-01 NOTE — ED Notes (Signed)
Pt able to speak in complete sentences, stating she has to go to the bathroom and for [place] to call her daugther.

## 2013-09-03 ENCOUNTER — Emergency Department (HOSPITAL_COMMUNITY): Payer: Medicare Other

## 2013-09-03 ENCOUNTER — Emergency Department (HOSPITAL_COMMUNITY)
Admission: EM | Admit: 2013-09-03 | Discharge: 2013-09-03 | Disposition: A | Discharge status: Home / Self Care | Payer: Medicare Other | Attending: Emergency Medicine | Admitting: Emergency Medicine

## 2013-09-03 ENCOUNTER — Encounter (HOSPITAL_COMMUNITY): Payer: Self-pay | Admitting: Emergency Medicine

## 2013-09-03 DIAGNOSIS — I10 Essential (primary) hypertension: Secondary | ICD-10-CM | POA: Diagnosis not present

## 2013-09-03 DIAGNOSIS — F329 Major depressive disorder, single episode, unspecified: Secondary | ICD-10-CM | POA: Diagnosis not present

## 2013-09-03 DIAGNOSIS — E039 Hypothyroidism, unspecified: Secondary | ICD-10-CM | POA: Diagnosis not present

## 2013-09-03 DIAGNOSIS — G20 Parkinson's disease: Secondary | ICD-10-CM | POA: Diagnosis not present

## 2013-09-03 DIAGNOSIS — R404 Transient alteration of awareness: Secondary | ICD-10-CM | POA: Diagnosis not present

## 2013-09-03 DIAGNOSIS — F0280 Dementia in other diseases classified elsewhere without behavioral disturbance: Secondary | ICD-10-CM | POA: Diagnosis not present

## 2013-09-03 DIAGNOSIS — G20A1 Parkinson's disease without dyskinesia, without mention of fluctuations: Secondary | ICD-10-CM | POA: Insufficient documentation

## 2013-09-03 DIAGNOSIS — M159 Polyosteoarthritis, unspecified: Secondary | ICD-10-CM | POA: Diagnosis not present

## 2013-09-03 DIAGNOSIS — R4182 Altered mental status, unspecified: Secondary | ICD-10-CM | POA: Diagnosis present

## 2013-09-03 DIAGNOSIS — Z7982 Long term (current) use of aspirin: Secondary | ICD-10-CM | POA: Insufficient documentation

## 2013-09-03 DIAGNOSIS — Z79899 Other long term (current) drug therapy: Secondary | ICD-10-CM | POA: Insufficient documentation

## 2013-09-03 DIAGNOSIS — F3289 Other specified depressive episodes: Secondary | ICD-10-CM | POA: Diagnosis not present

## 2013-09-03 LAB — CBC
HCT: 43.6 % (ref 36.0–46.0)
Hemoglobin: 14.4 g/dL (ref 12.0–15.0)
MCH: 28.3 pg (ref 26.0–34.0)
MCHC: 33 g/dL (ref 30.0–36.0)
MCV: 85.7 fL (ref 78.0–100.0)
PLATELETS: 172 10*3/uL (ref 150–400)
RBC: 5.09 MIL/uL (ref 3.87–5.11)
RDW: 14.8 % (ref 11.5–15.5)
WBC: 5.1 10*3/uL (ref 4.0–10.5)

## 2013-09-03 LAB — URINALYSIS, ROUTINE W REFLEX MICROSCOPIC
Bilirubin Urine: NEGATIVE
Glucose, UA: NEGATIVE mg/dL
Hgb urine dipstick: NEGATIVE
Ketones, ur: NEGATIVE mg/dL
Leukocytes, UA: NEGATIVE
NITRITE: NEGATIVE
Protein, ur: 30 mg/dL — AB
SPECIFIC GRAVITY, URINE: 1.009 (ref 1.005–1.030)
UROBILINOGEN UA: 0.2 mg/dL (ref 0.0–1.0)
pH: 7 (ref 5.0–8.0)

## 2013-09-03 LAB — COMPREHENSIVE METABOLIC PANEL
ALT: <5 U/L (ref 0–35)
AST: 16 U/L (ref 0–37)
Albumin: 3.2 g/dL — ABNORMAL LOW (ref 3.5–5.2)
Alkaline Phosphatase: 81 U/L (ref 39–117)
Anion gap: 11 (ref 5–15)
BUN: 20 mg/dL (ref 6–23)
CALCIUM: 9.8 mg/dL (ref 8.4–10.5)
CO2: 24 mEq/L (ref 19–32)
Chloride: 107 mEq/L (ref 96–112)
Creatinine, Ser: 1.05 mg/dL (ref 0.50–1.10)
GFR calc non Af Amer: 48 mL/min — ABNORMAL LOW (ref >90)
GFR, EST AFRICAN AMERICAN: 56 mL/min — AB (ref >90)
Glucose, Bld: 104 mg/dL — ABNORMAL HIGH (ref 70–99)
Potassium: 4.4 mEq/L (ref 3.7–5.3)
SODIUM: 142 meq/L (ref 137–147)
TOTAL PROTEIN: 6 g/dL (ref 6.0–8.3)
Total Bilirubin: 1 mg/dL (ref 0.3–1.2)

## 2013-09-03 LAB — I-STAT VENOUS BLOOD GAS, ED
ACID-BASE EXCESS: 1 mmol/L (ref 0.0–2.0)
Bicarbonate: 25.6 mEq/L — ABNORMAL HIGH (ref 20.0–24.0)
O2 Saturation: 89 %
TCO2: 27 mmol/L (ref 0–100)
pCO2, Ven: 41.6 mmHg — ABNORMAL LOW (ref 45.0–50.0)
pH, Ven: 7.396 — ABNORMAL HIGH (ref 7.250–7.300)
pO2, Ven: 57 mmHg — ABNORMAL HIGH (ref 30.0–45.0)

## 2013-09-03 LAB — CBG MONITORING, ED: Glucose-Capillary: 115 mg/dL — ABNORMAL HIGH (ref 70–99)

## 2013-09-03 LAB — I-STAT CG4 LACTIC ACID, ED: Lactic Acid, Venous: 0.73 mmol/L (ref 0.5–2.2)

## 2013-09-03 LAB — URINE MICROSCOPIC-ADD ON

## 2013-09-03 LAB — I-STAT TROPONIN, ED: TROPONIN I, POC: 0.01 ng/mL (ref 0.00–0.08)

## 2013-09-03 MED ORDER — METOPROLOL TARTRATE 25 MG PO TABS
50.0000 mg | ORAL_TABLET | Freq: Once | ORAL | Status: DC
  Filled 2013-09-03: qty 2

## 2013-09-03 MED ORDER — LORAZEPAM 2 MG/ML IJ SOLN
0.5000 mg | INTRAMUSCULAR | Status: DC | PRN
  Administered 2013-09-03 (×2): 0.5 mg via INTRAVENOUS
  Filled 2013-09-03 (×2): qty 1

## 2013-09-03 MED ORDER — HYDRALAZINE HCL 50 MG PO TABS
50.0000 mg | ORAL_TABLET | Freq: Once | ORAL | Status: DC
  Filled 2013-09-03: qty 1

## 2013-09-03 MED ORDER — CARBIDOPA-LEVODOPA ER 25-100 MG PO TBCR
1.0000 | EXTENDED_RELEASE_TABLET | Freq: Two times a day (BID) | ORAL | Status: DC
  Filled 2013-09-03 (×2): qty 1

## 2013-09-03 NOTE — ED Provider Notes (Signed)
CSN: [id number]     Arrival date & time 09/03/13  4825 History   First MD Initiated Contact with Patient 09/03/13 [phone]     Chief Complaint  Patient presents with  . Altered Mental Status     (Consider location/radiation/quality/duration/timing/severity/associated sxs/prior Treatment) HPI Patient is an elderly woman with Parkinson's disease and HTN who resides in an assisted living facility. She is BIB EMS from the facility with report of AMS. I have obtained history from the patient's dtr.   She was called by staff shortly prior to the patient's arrival with report that the patient had pressed call bell and, when staff arrived in her room, complained that the room was hot. A few minutes later, she became unresponsive. Dtr arrived to find the patient with eyes closed, mumbling and not responsive to verbal stimuli. Apparently, the patient had a similar, transient episode a couple of days ago which prompted her transfer to the ED.   She was evaluated by Dr. Stark Jock who ordered, among other studies, a CT head. This study was non-diagnostic as was the rest of the patient's ED work up.   Dtr says patient has not had any medication changes.   Past Medical History  Diagnosis Date  . Benign essential hypertension   . Hypothyroidism   . Osteoarthritis, generalized   . Parkinson disease   . Spinal stenosis   . History of necrotizing fasciitis     left leg, s/p debridement and graft  . Dementia in Parkinson's disease   . Depression    Past Surgical History  Procedure Laterality Date  . Skin debridement  2014    Brambhelt, MD  . Skin graft  2014    Brambhelt MD  . Spine surgery  2006    spinal stenosis  . Abdominal hysterectomy  1977   Family History  Problem Relation Age of Onset  . Heart disease Mother   . Heart disease Sister   . Hypertension Sister   . Stroke Sister   . Heart disease Sister     heart attack  . Cancer Sister     colon   History  Substance Use Topics  .  Smoking status: Never Smoker   . Smokeless tobacco: Never Used  . Alcohol Use: No   OB History   Grav Para Term Preterm Abortions TAB SAB Ect Mult Living                 Review of Systems  Initially unable to obtain from the patient due to AMS  Allergies  Ivp dye and Clindamycin  Home Medications   Prior to Admission medications   Medication Sig Start Date End Date Taking? Authorizing Provider  acetaminophen (TYLENOL) 500 MG tablet Take 500 mg by mouth every 6 (six) hours as needed for mild pain.     Historical Provider, MD  aspirin EC 81 MG tablet Take 81 mg by mouth daily.    Historical Provider, MD  carbidopa-levodopa (PARCOPA) 25-100 MG per disintegrating tablet Take 1 pill under the tongue up to twice daily. 08/15/13   Philmore Pali, NP  carbidopa-levodopa-entacapone (STALEVO) 37.5-150-200 MG per tablet Take 1 tablet by mouth 4 (four) times daily.    Historical Provider, MD  Cholecalciferol (VITAMIN D3) 2000 UNITS TABS Take 2,000 Units by mouth daily.     Historical Provider, MD  diclofenac sodium (VOLTAREN) 1 % GEL Apply 4 g topically 2 (two) times daily. for knee pain 02/09/13   Blanchie Serve, MD  furosemide (  LASIX) 20 MG tablet Take 20 mg by mouth. As needed or if weight gain is more than 3 lbs in one day    Historical Provider, MD  guaifenesin (ROBITUSSIN) 100 MG/5ML syrup Take 200 mg by mouth at bedtime as needed for cough.     Historical Provider, MD  hydrALAZINE (APRESOLINE) 25 MG tablet Take 25 mg by mouth 3 (three) times daily.    Historical Provider, MD  levothyroxine (SYNTHROID, LEVOTHROID) 25 MCG tablet Take 25 mcg by mouth daily before breakfast.    Historical Provider, MD  Melatonin 10 MG CAPS Take 10 mg by mouth. Daily at night, around 8:00 pm    Historical Provider, MD  metoprolol succinate (TOPROL-XL) 25 MG 24 hr tablet Take 50 mg by mouth daily. 02/09/13   Blanchie Serve, MD  nystatin (MYCOSTATIN) powder Apply topically at bedtime.    Historical Provider, MD   omeprazole (PRILOSEC) 20 MG capsule Take 20 mg by mouth daily.    Historical Provider, MD  potassium chloride SA (K-DUR,KLOR-CON) 20 MEQ tablet Take 20 mEq by mouth every three (3) days as needed (fluid retention).     Historical Provider, MD  senna-docusate (SENOKOT-S) 8.6-50 MG per tablet Take 2 tablets by mouth daily as needed for mild constipation.    Historical Provider, MD  traMADol (ULTRAM) 50 MG tablet Take 50 mg by mouth every 8 (eight) hours as needed for moderate pain.    Historical Provider, MD  triamcinolone (KENALOG) 0.025 % cream Apply 1 application topically as needed (rash).     Historical Provider, MD   BP 186/70  Pulse 60  Temp(Src) 98 F (36.7 C) (Oral)  Resp 18  SpO2 97% Physical Exam Gen: well nourished and well developed appearing, appears to be sleeping Head: NCAT Eyes: PERL Ears: normal to inspection Nose: normal to inspection, no epistaxis or drainage Mouth: oral mucsoa is well hydrated appearing, normal posterior oropharynx, + gag reflex Neck: supple, no stridor CV: RRR, no murmur, palpable peripheral pulses Resp: lung sounds are clear to auscultation bilaterally, no wheeing or rhonchi or rales, normal respiratory effort.  Abd: soft, nontender, non-distended, obese.  Extremities: normal to inspection.  Skin: warm and dry Neuro: does not open eyes, mumbling sounds, weak but symmetric grip strength and wiggling of the toes. No unilateral deficits.  Psyche; unable to assess  ED Course  Procedures (including critical care time)  Results for orders placed during the hospital encounter of 09/03/13 (from the past 24 hour(s))  CBC     Status: None   Collection Time    09/03/13  5:50 AM      Result Value Ref Range   WBC 5.1  4.0 - 10.5 K/uL   RBC 5.09  3.87 - 5.11 MIL/uL   Hemoglobin 14.4  12.0 - 15.0 g/dL   HCT 43.6  36.0 - 46.0 %   MCV 85.7  78.0 - 100.0 fL   MCH 28.3  26.0 - 34.0 pg   MCHC 33.0  30.0 - 36.0 g/dL   RDW 14.8  11.5 - 15.5 %   Platelets  172  150 - 400 K/uL  COMPREHENSIVE METABOLIC PANEL     Status: Abnormal   Collection Time    09/03/13  5:50 AM      Result Value Ref Range   Sodium 142  137 - 147 mEq/L   Potassium 4.4  3.7 - 5.3 mEq/L   Chloride 107  96 - 112 mEq/L   CO2 24  19 - 32  mEq/L   Glucose, Bld 104 (*) 70 - 99 mg/dL   BUN 20  6 - 23 mg/dL   Creatinine, Ser 1.05  0.50 - 1.10 mg/dL   Calcium 9.8  8.4 - 10.5 mg/dL   Total Protein 6.0  6.0 - 8.3 g/dL   Albumin 3.2 (*) 3.5 - 5.2 g/dL   AST 16  0 - 37 U/L   ALT <5  0 - 35 U/L   Alkaline Phosphatase 81  39 - 117 U/L   Total Bilirubin 1.0  0.3 - 1.2 mg/dL   GFR calc non Af Amer 48 (*) >90 mL/min   GFR calc Af Amer 56 (*) >90 mL/min   Anion gap 11  5 - 15  I-STAT TROPOININ, ED     Status: None   Collection Time    09/03/13  5:54 AM      Result Value Ref Range   Troponin i, poc 0.01  0.00 - 0.08 ng/mL   Comment 3           I-STAT CG4 LACTIC ACID, ED     Status: None   Collection Time    09/03/13  5:56 AM      Result Value Ref Range   Lactic Acid, Venous 0.73  0.5 - 2.2 mmol/L  I-STAT VENOUS BLOOD GAS, ED     Status: Abnormal   Collection Time    09/03/13  6:23 AM      Result Value Ref Range   pH, Ven 7.396 (*) 7.250 - 7.300   pCO2, Ven 41.6 (*) 45.0 - 50.0 mmHg   pO2, Ven 57.0 (*) 30.0 - 45.0 mmHg   Bicarbonate 25.6 (*) 20.0 - 24.0 mEq/L   TCO2 27  0 - 100 mmol/L   O2 Saturation 89.0     Acid-Base Excess 1.0  0.0 - 2.0 mmol/L   Sample type VENOUS      Imaging Review Ct Head Wo Contrast  09/01/2013   CLINICAL DATA:  Aphasia.  Diffuse headache.  EXAM: CT HEAD WITHOUT CONTRAST  TECHNIQUE: Contiguous axial images were obtained from the base of the skull through the vertex without intravenous contrast.  COMPARISON:  None.  FINDINGS: There is no evidence of acute infarction, mass lesion, or intra- or extra-axial hemorrhage on CT.  Mild prominence of the ventricles suggests mild cortical volume loss.  The brainstem and fourth ventricle are within normal  limits. The basal ganglia are unremarkable in appearance. The cerebral hemispheres demonstrate grossly normal gray-white differentiation. No mass effect or midline shift is seen.  There is no evidence of fracture; visualized osseous structures are unremarkable in appearance. The visualized portions of the orbits are within normal limits. There is mild partial opacification of the mastoid air cells bilaterally. The paranasal sinuses are well-aerated. No significant soft tissue abnormalities are seen.  IMPRESSION: 1. No acute intracranial pathology seen on CT. 2. Mild cortical volume loss noted. 3. Mild partial opacification of the mastoid air cells bilaterally.   Electronically Signed   By: Garald Balding M.D.   On: 09/01/2013 07:01    EKG: nsr, no acute ischemic changes, normal intervals, normal axis, normal qrs complex  MDM   Patient is s/p transient AMS without unilateral deficits. History of similar episode a couple of days ago. On re-examination around 0730, patient is awake, alert and oriented. At her baseline, per the daughter. I suspect this may have been a Parkinsons related event. I have asked Dr. Aram Beecham to kindly evaluate the patient  in the ED and give recommendations re: any further studies which may be indicated.  Case discussed with Dr. Peyton Bottoms who will assume care at this time with change of shift.     Elyn Peers, MD 09/03/13 [phone]

## 2013-09-03 NOTE — ED Notes (Signed)
Pt's daughter becoming agitated while waiting for neurology consult, Dr. Eulis Foster aware and will speak with daughter once he is free.

## 2013-09-03 NOTE — ED Notes (Signed)
Patient transported to CT 

## 2013-09-03 NOTE — ED Notes (Signed)
Neurology at bedside.

## 2013-09-03 NOTE — Discharge Instructions (Signed)
The episode today, appears to be related to Parkinson disease. There was no evidence for a stroke. Follow up with your neurologist to discuss further treatment for Parkinson disease.     Parkinson Disease Parkinson disease is a disorder of the central nervous system, which includes the brain and spinal cord. A person with this disease slowly loses the ability to completely control body movements. Within the brain, there is a group of nerve cells (basal ganglia) that help control movement. The basal ganglia are damaged and do not work properly in a person with Parkinson disease. In addition, the basal ganglia produce and use a brain chemical called dopamine. The dopamine chemical sends messages to other parts of the body to control and coordinate body movements. Dopamine levels are low in a person with Parkinson disease. If the dopamine levels are low, then the body does not receive the correct messages it needs to move normally.  CAUSES  The exact reason why the basal ganglia get damaged is not known. Some medical researchers have thought that infection, genes, environment, and certain medicines may contribute to the cause.  SYMPTOMS   An early symptom of Parkinson disease is often an uncontrolled shaking (tremor) of the hands. The tremor will often disappear when the affected hand is consciously used.  As the disease progresses, walking, talking, getting out of a chair, and new movements become more difficult.  Muscles get stiff and movements become slower.  Balance and coordination become harder.  Depression, trouble swallowing, urinary problems, constipation, and sleep problems can occur.  Later in the disease, memory and thought processes may deteriorate. DIAGNOSIS  There are no specific tests to diagnose Parkinson disease. You may be referred to a neurologist for evaluation. Your caregiver will ask about your medical history, symptoms, and perform a physical exam. Blood tests and  imaging tests of your brain may be performed to rule out other diseases. The imaging tests may include an MRI or a CT scan. TREATMENT  The goal of treatment is to relieve symptoms. Medicines may be prescribed once the symptoms become troublesome. Medicine will not stop the progression of the disease, but medicine can make movement and balance better and help control tremors. Speech and occupational therapy may also be prescribed. Sometimes, surgical treatment of the brain can be done in young people. HOME CARE INSTRUCTIONS  Get regular exercise and rest periods during the day to help prevent exhaustion and depression.  If getting dressed becomes difficult, replace buttons and zippers with Velcro and elastic on your clothing.  Take all medicine as directed by your caregiver.  Install grab bars or railings in your home to prevent falls.  Go to speech or occupational therapy as directed.  Keep all follow-up visits as directed by your caregiver. SEEK MEDICAL CARE IF:  Your symptoms are not controlled with your medicine.  You fall.  You have trouble swallowing or choke on your food. MAKE SURE YOU:  Understand these instructions.  Will watch your condition.  Will get help right away if you are not doing well or get worse. Document Released: 01/04/2000 Document Revised: 05/03/2012 Document Reviewed: 02/05/2011 [facility] Patient Information 2015 [place], [place]. This information is not intended to replace advice given to you by your health care provider. Make sure you discuss any questions you have with your health care provider.

## 2013-09-03 NOTE — ED Notes (Signed)
Pt's daughter at bedside, concerned with cost of medications that they already have at home. Dr. Cheri Guppy (EDP) consulted and feels it is appropriate for patient to take home medications (BP and Parkinsons meds). Daughter has medications for mother to take.

## 2013-09-03 NOTE — ED Notes (Signed)
Per EMS staff at nursing facility states pt stopped responding verbally to staff. Pt would hold eyes tight when staff tried to check pupils. All vitals WNL. Family at bedside. Pt seen last week for same episode.

## 2013-09-03 NOTE — Consult Note (Addendum)
Referring Physician: ED    Chief Complaint: TRANSIENT UNRESPONSIVENESS   HPI:                                                                                                                                         Andrea Dorsey is an 80 y.o. female with q past medical history significant for PD-dementia, HTN, spinal stenosis, brought in via EMS due to [place]. Patient resides in an assisted living facility and was seen in this ED couple of days ago due to a similar episode of transient unresponsiveness and had CT brain and serologies that were unrevealing. Earlier today,  she was called by staff shortly prior to the patient's arrival with report that the patient had pressed call bell and, when staff arrived in her room, complained that the room was hot. A few minutes later, she became unresponsive but patient said that she was able to understand people talking to her, couldn't talk back to them, but recalls the entire episode. No reported abnormal movements,  bladder or bowel incontinence, " just still and unable to respond" as per daughter account. No recent change in medications, fever, head trauma, or infections. She was poorly responsive on arrival to the ED today, but on reexamination by ED attending she was alert and awake, following commands properly, and without evidence of focal findings. Serologies and UA unremarkable. At this moment, she denies vertigo, double vision, difficulty swallowing, focal weakness or numbness, language or vision impairment. She ambulates with a walker and requires assistance to perform majority of her activities of daily living. On stalevo and Parcopa.  Date last known well: unclear Time last known well: unclear tPA Given: no NIHSS: late presentation   Past Medical History  Diagnosis Date  . Benign essential hypertension   . Hypothyroidism   . Osteoarthritis, generalized   . Parkinson disease   . Spinal stenosis   . History of necrotizing fasciitis      left leg, s/p debridement and graft  . Dementia in Parkinson's disease   . Depression     Past Surgical History  Procedure Laterality Date  . Skin debridement  2014    Brambhelt, MD  . Skin graft  2014    Brambhelt MD  . Spine surgery  2006    spinal stenosis  . Abdominal hysterectomy  1977    Family History  Problem Relation Age of Onset  . Heart disease Mother   . Heart disease Sister   . Hypertension Sister   . Stroke Sister   . Heart disease Sister     heart attack  . Cancer Sister     colon   Social History:  reports that she has never smoked. She has never used smokeless tobacco. She reports that she does not drink alcohol or use illicit drugs.  Allergies:  Allergies  Allergen Reactions  . Ivp Dye [Iodinated Diagnostic Agents]  Only when intravenous, not on external skin.  . Clindamycin Rash    Unclear whether patient has an actual allergy to clindamycin. On beta-lactam at the same time.    Medications:                                                                                                                           I have reviewed the patient's current medications.  ROS:                                                                                                                                       History obtained from the patient, daughter, and chart review  General ROS: negative for - chills, fever, night sweats, or weight loss Psychological ROS: negative for - behavioral disorder, hallucinations, memory difficulties, or suicidal ideation Ophthalmic ROS: negative for - blurry vision, double vision, eye pain or loss of vision ENT ROS: negative for - epistaxis, nasal discharge, oral lesions, sore throat, tinnitus or vertigo Allergy and Immunology ROS: negative for - hives or itchy/watery eyes Hematological and Lymphatic ROS: negative for - bleeding problems, bruising or swollen lymph nodes Endocrine ROS: negative for - galactorrhea, hair  pattern changes, polydipsia/polyuria or temperature intolerance Respiratory ROS: negative for - cough, hemoptysis, shortness of breath or wheezing Cardiovascular ROS: negative for - chest pain, dyspnea on exertion, edema or irregular heartbeat Gastrointestinal ROS: negative for - abdominal pain, diarrhea, hematemesis, nausea/vomiting or stool incontinence Genito-Urinary ROS: negative for - dysuria, hematuria, incontinence or urinary frequency/urgency Musculoskeletal ROS: negative for - joint swelling Neurological ROS: as noted in HPI Dermatological ROS: negative for rash and skin lesion changes  Physical exam: pleasant female in no apparent distress. Blood pressure 189/62, pulse 55, temperature 98 F (36.7 C), temperature source Oral, resp. rate 18, SpO2 100.00%. Head: normocephalic. Neck: supple, no bruits, no JVD. Cardiac: no murmurs. Lungs: clear. Abdomen: soft, no tender, no mass. Extremities: no edema. Neurologic Examination:  General: Mental Status: Alert, oriented, thought content appropriate.  Speech fluent without evidence of aphasia.  Able to follow 3 step commands without difficulty. Cranial Nerves: II: Discs flat bilaterally; Visual fields grossly normal, pupils equal, round, reactive to light and accommodation III,IV, VI: ptosis not present, extra-ocular motions intact bilaterally V,VII: smile symmetric, facial masking with light touch sensation normal bilaterally VIII: hearing normal bilaterally IX,X: gag reflex present XI: bilateral shoulder shrug XII: midline tongue extension without atrophy or fasciculations Motor: Moves the upper extremities better than lower extremities. Sensory: Pinprick and light touch intact throughout, bilaterally Deep Tendon Reflexes:  1+ all over Plantars: Right: downgoing   Left: downgoing Coordination; Resting tremor right hand Gait: No  tested     Results for orders placed during the hospital encounter of 09/03/13 (from the past 48 hour(s))  CBC     Status: None   Collection Time    09/03/13  5:50 AM      Result Value Ref Range   WBC 5.1  4.0 - 10.5 K/uL   RBC 5.09  3.87 - 5.11 MIL/uL   Hemoglobin 14.4  12.0 - 15.0 g/dL   HCT 43.6  36.0 - 46.0 %   MCV 85.7  78.0 - 100.0 fL   MCH 28.3  26.0 - 34.0 pg   MCHC 33.0  30.0 - 36.0 g/dL   RDW 14.8  11.5 - 15.5 %   Platelets 172  150 - 400 K/uL  COMPREHENSIVE METABOLIC PANEL     Status: Abnormal   Collection Time    09/03/13  5:50 AM      Result Value Ref Range   Sodium 142  137 - 147 mEq/L   Potassium 4.4  3.7 - 5.3 mEq/L   Chloride 107  96 - 112 mEq/L   CO2 24  19 - 32 mEq/L   Glucose, Bld 104 (*) 70 - 99 mg/dL   BUN 20  6 - 23 mg/dL   Creatinine, Ser 1.05  0.50 - 1.10 mg/dL   Calcium 9.8  8.4 - 10.5 mg/dL   Total Protein 6.0  6.0 - 8.3 g/dL   Albumin 3.2 (*) 3.5 - 5.2 g/dL   AST 16  0 - 37 U/L   ALT <5  0 - 35 U/L   Alkaline Phosphatase 81  39 - 117 U/L   Total Bilirubin 1.0  0.3 - 1.2 mg/dL   GFR calc non Af Amer 48 (*) >90 mL/min   GFR calc Af Amer 56 (*) >90 mL/min   Comment: (NOTE)     The eGFR has been calculated using the CKD EPI equation.     This calculation has not been validated in all clinical situations.     eGFR's persistently <90 mL/min signify possible Chronic Kidney     Disease.   Anion gap 11  5 - 15  I-STAT TROPOININ, ED     Status: None   Collection Time    09/03/13  5:54 AM      Result Value Ref Range   Troponin i, poc 0.01  0.00 - 0.08 ng/mL   Comment 3            Comment: Due to the release kinetics of cTnI,     a negative result within the first hours     of the onset of symptoms does not rule out     myocardial infarction with certainty.     If myocardial infarction is still suspected,  repeat the test at appropriate intervals.  I-STAT CG4 LACTIC ACID, ED     Status: None   Collection Time    09/03/13  5:56 AM       Result Value Ref Range   Lactic Acid, Venous 0.73  0.5 - 2.2 mmol/L  I-STAT VENOUS BLOOD GAS, ED     Status: Abnormal   Collection Time    09/03/13  6:23 AM      Result Value Ref Range   pH, Ven 7.396 (*) 7.250 - 7.300   pCO2, Ven 41.6 (*) 45.0 - 50.0 mmHg   pO2, Ven 57.0 (*) 30.0 - 45.0 mmHg   Bicarbonate 25.6 (*) 20.0 - 24.0 mEq/L   TCO2 27  0 - 100 mmol/L   O2 Saturation 89.0     Acid-Base Excess 1.0  0.0 - 2.0 mmol/L   Sample type VENOUS    URINALYSIS, ROUTINE W REFLEX MICROSCOPIC     Status: Abnormal   Collection Time    09/03/13  7:31 AM      Result Value Ref Range   Color, Urine YELLOW  YELLOW   APPearance CLEAR  CLEAR   Specific Gravity, Urine 1.009  1.005 - 1.030   pH 7.0  5.0 - 8.0   Glucose, UA NEGATIVE  NEGATIVE mg/dL   Hgb urine dipstick NEGATIVE  NEGATIVE   Bilirubin Urine NEGATIVE  NEGATIVE   Ketones, ur NEGATIVE  NEGATIVE mg/dL   Protein, ur 30 (*) NEGATIVE mg/dL   Urobilinogen, UA 0.2  0.0 - 1.0 mg/dL   Nitrite NEGATIVE  NEGATIVE   Leukocytes, UA NEGATIVE  NEGATIVE  URINE MICROSCOPIC-ADD ON     Status: Abnormal   Collection Time    09/03/13  7:31 AM      Result Value Ref Range   Squamous Epithelial / LPF MANY (*) RARE   Bacteria, UA RARE  RARE  CBG MONITORING, ED     Status: Abnormal   Collection Time    09/03/13  8:47 AM      Result Value Ref Range   Glucose-Capillary 115 (*) 70 - 99 mg/dL   Dg Chest 1 View  09/03/2013   CLINICAL DATA:  Altered mental status.  EXAM: CHEST - 1 VIEW  COMPARISON:  Chest radiograph November 08, 2012  FINDINGS: The cardiac silhouette appears moderately enlarged, similar. Central pulmonary vascular congestion and mild interstitial prominence, unchanged. No pleural effusions or focal consolidations. Minimal linear densities left lung base. No pneumothorax.  Soft tissue planes and included osseous structures are nonsuspicious. Moderate degenerative change of the thoracic spine.  IMPRESSION: Stable cardiomegaly, interstitial  prominence likely reflects pulmonary edema. Left lung base atelectasis.   Electronically Signed   By: Elon Alas   On: 09/03/2013 04:59   Ct Head Wo Contrast  09/03/2013   CLINICAL DATA:  Altered mental status.  EXAM: CT HEAD WITHOUT CONTRAST  TECHNIQUE: Contiguous axial images were obtained from the base of the skull through the vertex without intravenous contrast.  COMPARISON:  CT of the head performed 09/01/2013  FINDINGS: There is no evidence of acute infarction, mass lesion, or intra- or extra-axial hemorrhage on CT.  The posterior fossa, including the cerebellum, brainstem and fourth ventricle, is within normal limits. The third and lateral ventricles, and basal ganglia are unremarkable in appearance. The cerebral hemispheres are symmetric in appearance, with normal gray-white differentiation. No mass effect or midline shift is seen.  There is somewhat unusual prominence of bony density about the sella; this is  thought to reflect diffuse nonspecific osseous expansion along the base of the skull, as vague sclerotic expansion is noted along the sphenoid bone bilaterally.  There is no evidence of fracture; visualized osseous structures are unremarkable in appearance. The visualized portions of the orbits are within normal limits. Inspissated mucus is noted filling the left maxillary sinus. There is mild partial opacification of the mastoid air cells bilaterally. The remaining paranasal sinuses are well-aerated. No significant soft tissue abnormalities are seen.  IMPRESSION: 1. No acute intracranial pathology seen on CT. 2. Inspissated mucus filling the left maxillary sinus, and mild partial opacification of the mastoid air cells bilaterally.   Electronically Signed   By: Garald Balding M.D.   On: 09/03/2013 04:58     Assessment: 80 y.o. female with advanced PD, brought in with transient episode of unresponsiveness. Had similar episode couple days ago. Now back to baseline and neuro-exam that is  significant only for PD features. Serologies and UA unremarkable. Episode lasted for approximately 2 hours, thus doubt partial onset seizures. Posterior circulation TIA very questionable. I wonder if those episodes could PD related. In any case, daughter is very concerned about it and I will recommend MRI/MRA brain to look to her brainstem. She soul be able to go home if neuro-imaging is unrevealing, and follow up with her neurologist. Will follow up after MRI/MRA.    Dorian Pod, MD [facility] [phone]  09/03/2013, 10:13 AM  Addendum: MRI/MRA brain unremarkable. Spoke with patient and daughter and explained to them test results and need to schedule a sooner follow up visit with her neurologist. Patient can be discharged home today.  Dorian Pod ,MD

## 2013-09-03 NOTE — ED Provider Notes (Signed)
Followup evaluation, previously seen by Dr. Lemar Livings.  I briefly discussed the case with Dr. Armida Sans, the Neuro-hospitalist. She has had at least 2 episodes of decreased responsiveness, without clear evidence for syncope.  Dr. Armida Sans evaluated the patient and felt that the periods of decreased responsiveness, were likely related to her Parkinson's disease. He was able to discuss this, with the patient's daughter. The daughter was interested in having additional testing done. Dr. Armida Sans recommended a MRI and MRA. At this time, 10:15, the patient is tremulous and somewhat anxious. She will be medicated with Ativan just prior to attempt at MRI imaging.   13:30- Patient status is unchanged. Her MRI has returned. It does not show any acute abnormality. Dr. Armida Sans has discussed Amelia Jo finding with the daughter.  The daughter is aware of this, and plans on following up with patient's neurologist, as soon as possible.  Richarda Blade, MD 09/04/13 [phone]

## 2013-09-03 NOTE — ED Notes (Signed)
Pt transported to MRI 

## 2013-09-05 ENCOUNTER — Telehealth: Payer: Self-pay | Admitting: Neurology

## 2013-09-05 NOTE — Telephone Encounter (Signed)
Called patient to scheduled ER F/u appt, lt VM message for call back

## 2013-09-06 NOTE — Telephone Encounter (Signed)
Patient has been scheduled for Hospital, confirmed with daughter(Wanda)

## 2013-09-07 ENCOUNTER — Encounter: Payer: Self-pay | Admitting: Neurology

## 2013-09-07 ENCOUNTER — Ambulatory Visit (INDEPENDENT_AMBULATORY_CARE_PROVIDER_SITE_OTHER): Payer: Medicare Other | Admitting: Neurology

## 2013-09-07 VITALS — BP 146/67 | HR 65 | Temp 97.5°F | Ht 64.0 in | Wt 215.0 lb

## 2013-09-07 DIAGNOSIS — F32A Depression, unspecified: Secondary | ICD-10-CM

## 2013-09-07 DIAGNOSIS — G20 Parkinson's disease: Secondary | ICD-10-CM

## 2013-09-07 DIAGNOSIS — R6889 Other general symptoms and signs: Secondary | ICD-10-CM

## 2013-09-07 DIAGNOSIS — F329 Major depressive disorder, single episode, unspecified: Secondary | ICD-10-CM

## 2013-09-07 DIAGNOSIS — F3289 Other specified depressive episodes: Secondary | ICD-10-CM

## 2013-09-07 DIAGNOSIS — F411 Generalized anxiety disorder: Secondary | ICD-10-CM

## 2013-09-07 MED ORDER — ESCITALOPRAM OXALATE 5 MG PO TABS: 5.0000 mg | ORAL_TABLET | Freq: Every day | ORAL | Status: DC

## 2013-09-07 MED ORDER — CARBIDOPA-LEVODOPA ER 50-200 MG PO TBCR: 1.0000 | EXTENDED_RELEASE_TABLET | Freq: Every day | ORAL | Status: DC

## 2013-09-07 NOTE — Patient Instructions (Addendum)
We will do an EEG and call you with the results.  We will continue with Stalevo at 150 mg 4 times a day, at 8, 12, 4 PM and 8 PM.  Continue using Parcopa 1 pill twice daily as needed in between your Stalevo doses. Once you are settled in your new home, we will add Sinemet CR 50/200 mg at bedtime, between 9-10 PM. Also, we will start you on very small dose of lexapro 5 mg, once daily in the morning. If you become too sleepy from it, switch to bedtime. Rare side effects include increase in depression and rarely suicidal ideations. It may take weeks for the antidepressant to start working.  Keep your appointment in October with me. Good look with your move!

## 2013-09-07 NOTE — Progress Notes (Signed)
Subjective:    Patient ID: Andrea Dorsey is a 80 y.o. female.  HPI    Interim history:   Andrea Dorsey is a very pleasant 80 year old right-handed woman with an underlying complex medical history of spinal stenosis, necrotizing fasciitis, status post debridement and grafting on the right lower leg, lower extremity edema, insomnia, hypothyroidism, osteoarthritis, hypertension, depression, and obesity, who presents for followup consultation of her advanced, right-sided predominant Parkinson's disease, complicated by hallucinations, memory loss, mood disorder including anxiety and depression, sleep disorder including insomnia, OSA and RBD. She is accompanied by her daughter again today. I last saw her on 06/08/2013, at which time I suggested she continue with Stalevo 150 4 times a day. She previously has had hallucinations with increased doses. I suggested adding a little extra levodopa in the form of Parcopa half a pill up to twice daily as needed. This was supposed to help with freezing. In the interim, about a month later on 07/13/2013 she was seen by Charlott Holler, NP for a sooner than scheduled appointment at which time the Parcopa dose was increased to one whole pill up to twice daily for rigidity and freezing. She presented to the emergency room on 07/15/2013 with generalized complaint of weakness and freezing and was advised to increase her Parcopa. She presented voice recently this month to the emergency room with altered sensorium, or staring spells. Workup with head CT and MRIs were negative. It was felt that these were related to Parkinson's disease. She had a brain MRI and MRA without contrast on 09/03/2013:No acute intracranial abnormality. 2. Cerebral atrophy and single remote microhemorrhage in the left frontal lobe. 3. Unremarkable head MRA. In addition, have reviewed the images through the PACS system. She had head CT without contrast on 09/03/2013:No acute intracranial pathology seen on CT. 2.  Inspissated mucus filling the left maxillary sinus, and mild partial opacification of the mastoid air cells bilaterally. She had head CT without contrast on 09/01/2013: No acute intracranial pathology seen on CT. 2. Mild cortical volume loss noted. 3. Mild partial opacification of the mastoid air cells bilaterally. In addition, reviewed the images through the PACS system.  Today, her daughter reports, that her mother has had reasonable days in the last 4 days, no more staring or zoning out spells. She is in the process of moving to a townhome with a caretaker for 4 days and 3 nights and family staying with the the rest of the time. She still has trouble with freezing and insomnia, still on melatonin.   I saw her on 02/03/13, at which time I increased her Stalevo to 5 times a day. We talked about potential side effects. I asked her to stop the Seroquel and started her on low-dose clonazepam for insomnia and RBD. In the interim she was seen by our nurse practitioner, Ms. Lam on 04/08/2013, at which time I also saw her, and we mutually decided to decrease Stalevo back to 4 times a day because of worsening confusion and hallucinations. Her clonazepam was discontinued by her PCP because of side effects. I suggested a trial of melatonin, 5-10 mg. We checked some labs including CBC, CMP, CRP, ESR and urinalysis. Labs showed no significant abnormalities, mild but stable kidney impairment was noted. She was encouraged to drink more water. She has seen her PCP in April 2015 and was advised to take her fluid pill as needed, an increase of melatonin to 10 mg.  I first met her on 10/15/2012, at which time a continued her  Stalevo. I suggested a small dose of Seroquel to help her sleep and tone down the REM behavior disorder and encouraged him to discuss with her primary care physician the addition of an antidepressant. She presents with a complaint of worsening tremors.  She has been in ALF, [facility] [address] since  10/18/12. She has a Hx of vivid dreams and tends to act out in her sleep.  She previously used to see a neurologist at [facility], when she lived in [place], [place]. She was diagnosed with PD about 12 years ago when she was still residing in [place]. She needs assistance with her ADLs. She has been living with her daughter and son-in-law and they have looked into the possibility of a long-term care facility but the patient has been resistant. She has had problems at night including sundowning, confusion, inability to sleep.  Her symptoms started on one side with tremors, but the patient was not sure which side. She has been on Stalevo for the past 2 years, and prior to that she was on C/L, and prior to that she was on Amantadine. She may not have tried a dopamine agonist or rasagiline in the past. She has been experiencing nausea with her PD medications and still has occasional nausea. In March 2014 she developed necrotizing fasciitis and needed debridement and grafting. She developed hallucinations at the time, but was on pain medications at the time, but the [facility] persisted beyond that. She also started having memory loss then. She developed cellulitis with complications in her jaw and needed all remaining teeth removed and had IV antibiotics in mid-2014. She has no FHx of PD or dementia. She has no Hx of psychiatric premorbid illness. In 2006 she had back surgery. She has no exposure to chemicals, or agent orange. She has been an anxious person. She is not able to sleep at night. She was tried on Ambien and amitriptyline. She has difficulty with sleep onset and sleep maintenance.  She has occasional urinary incontinence, occasional constipation. She snores, and needed to have a sleep study, but did not go. She has had some dream enactments and has slid out of bed.  She had been very opposed to going into assisted living, but understood that she given her complex medical history and multiple issues and  advanced Parkinson's disease she was no longer safe to live by herself.   Her Past Medical History Is Significant For: Past Medical History  Diagnosis Date  . Benign essential hypertension   . Hypothyroidism   . Osteoarthritis, generalized   . Parkinson disease   . Spinal stenosis   . History of necrotizing fasciitis     left leg, s/p debridement and graft  . Dementia in Parkinson's disease   . Depression     Her Past Surgical History Is Significant For: Past Surgical History  Procedure Laterality Date  . Skin debridement  2014    Brambhelt, MD  . Skin graft  2014    Brambhelt MD  . Spine surgery  2006    spinal stenosis  . Abdominal hysterectomy  1977    Her Family History Is Significant For: Family History  Problem Relation Age of Onset  . Heart disease Mother   . Heart disease Sister   . Hypertension Sister   . Stroke Sister   . Heart disease Sister     heart attack  . Cancer Sister     colon    Her Social History Is  Significant For: History   Social History  . Marital Status: Widowed    Spouse Name: N/A    Number of Children: 2  . Years of Education: 12   Occupational History  .      retired    Social History Main Topics  . Smoking status: Never Smoker   . Smokeless tobacco: Never Used  . Alcohol Use: No  . Drug Use: No  . Sexual Activity: Not Currently   Other Topics Concern  . None   Social History Narrative   Patient lives in assisted living.patient is right handed    Her Allergies Are:  Allergies  Allergen Reactions  . Ivp Dye [Iodinated Diagnostic Agents]     Only when intravenous, not on external skin.  . Clindamycin Rash    Unclear whether patient has an actual allergy to clindamycin. On beta-lactam at the same time.  :   Her Current Medications Are:  Outpatient Encounter Prescriptions as of 09/07/2013  Medication Sig  . acetaminophen (TYLENOL) 500 MG tablet Take 500 mg by mouth every 6 (six) hours as needed for mild pain.   Marland Kitchen  aspirin EC 81 MG tablet Take 81 mg by mouth daily.  . carbidopa-levodopa (PARCOPA) 25-100 MG per disintegrating tablet Take 1 pill under the tongue up to twice daily.  . carbidopa-levodopa-entacapone (STALEVO) 37.5-150-200 MG per tablet Take 1 tablet by mouth 4 (four) times daily.  . Cholecalciferol (VITAMIN D3) 2000 UNITS TABS Take 2,000 Units by mouth daily.   . diclofenac sodium (VOLTAREN) 1 % GEL Apply 4 g topically 2 (two) times daily. for knee pain  . furosemide (LASIX) 20 MG tablet Take 20 mg by mouth. As needed or if weight gain is more than 3 lbs in one day  . guaifenesin (ROBITUSSIN) 100 MG/5ML syrup Take 200 mg by mouth at bedtime as needed for cough.   . hydrALAZINE (APRESOLINE) 25 MG tablet Take 25 mg by mouth 3 (three) times daily.  Marland Kitchen levothyroxine (SYNTHROID, LEVOTHROID) 25 MCG tablet Take 25 mcg by mouth daily before breakfast.  . Melatonin 10 MG CAPS Take 10 mg by mouth. Daily at night, around 8:00 pm  . metoprolol succinate (TOPROL-XL) 25 MG 24 hr tablet Take 50 mg by mouth daily.  Marland Kitchen nystatin (MYCOSTATIN) powder Apply topically at bedtime.  Marland Kitchen nystatin (MYCOSTATIN) powder Apply to affected bilateral inguinal areas 2 times daily. Id # [id number]  . omeprazole (PRILOSEC) 20 MG capsule Take 20 mg by mouth daily.  . potassium chloride SA (K-DUR,KLOR-CON) 20 MEQ tablet Take 20 mEq by mouth every three (3) days as needed (fluid retention).   Marland Kitchen senna-docusate (SENOKOT-S) 8.6-50 MG per tablet Take 2 tablets by mouth daily as needed for mild constipation.  . traMADol (ULTRAM) 50 MG tablet Take 50 mg by mouth every 8 (eight) hours as needed for moderate pain.  Marland Kitchen triamcinolone (KENALOG) 0.025 % cream Apply 1 application topically as needed (rash).   :  Review of Systems:  Out of a complete 14 point review of systems, all are reviewed and negative with the exception of these symptoms as listed below:   Review of Systems  HENT:       Ringing in ears  Genitourinary:       Frequency of  urination  Musculoskeletal: Positive for back pain.       Joint pain  Skin: Positive for rash.  Neurological: Positive for tremors and weakness.       Insomnia,snoring  Psychiatric/Behavioral:  Nervous/anxious    Objective:  Neurologic Exam  Physical Exam Physical Examination:   Filed Vitals:   09/07/13 1058  BP: 146/67  Pulse: 65  Temp: 97.5 F (36.4 C)    General Examination: The patient is a very pleasant 80 y.o. female in no acute distress. She is obese. She appears frail and deconditioned. She is able to provide her history better today.   HEENT: Normocephalic, atraumatic, pupils are equal, round and reactive to light and accommodation. Extraocular tracking shows moderate saccadic breakdown without nystagmus noted. There is limitation to upper gaze. There is mild decrease in eye blink rate. Hearing is impaired mildly. Funduscopic exam is normal, but there is evidence of cataract bilaterally. Face is symmetric with moderate facial masking and normal facial sensation. There is a mild lower lip and jaw tremor. Neck is moderately rigid with intact passive ROM. There are no carotid bruits on auscultation. Oropharynx exam reveals moderate mouth dryness. There is significant airway crowding noted d/t redundant soft palate and large tongue. She is edentulous. Mallampati is class III. Tongue protrudes centrally and palate elevates symmetrically. There is mild drooling.   Chest: is clear to auscultation without wheezing, rhonchi or crackles noted.  Heart: sounds are regular and normal without murmurs, rubs or gallops noted.   Abdomen: is soft, non-tender and non-distended with normal bowel sounds appreciated on auscultation.  Extremities: There is 2+ pitting edema in the distal lower extremities bilaterally. Pedal pulses are intact. Chronic stasis-like changes are noted in the distal legs bilaterally with s/p skin grafting on the right. There are no varicose veins.  Skin: is warm  and dry with no trophic changes noted. Age-related changes are noted on the skin.   Musculoskeletal: exam reveals no obvious joint deformities, tenderness, joint swelling or erythema.  Neurologically:  Mental status: The patient is awake and alert, paying good  attention. She is able to partially provide the history. Her daughter provides details and most of the entire history. She is oriented to: person, place, time/date and situation. Her memory, attention, language and knowledge are impaired mildly. There is no aphasia, agnosia, apraxia or anomia. There is a moderate degree of bradyphrenia. Speech is moderately to severely hypophonic with mild dysarthria noted, however speech is edentulous as well. Mood is congruent and affect is anxious.    Cranial nerves are as described above under HEENT exam. In addition, shoulder shrug is normal with equal shoulder height noted.  Motor exam: Normal bulk, and strength for age is noted. There are no dyskinesias noted.  Tone is mildly rigid with presence of cogwheeling in the right>left upper extremity and in both LEs. There is overall moderate bradykinesia. There is no drift or rebound.  There is an intermittent mild to moderate UE resting tremor on the right and a slight intermittent resting tremor in the left upper extremity, not much changed.  Romberg is not tested.   Reflexes are 1+ in the upper extremities and trace in the knees and absent in the ankle.   Fine motor skills exam: Finger taps are moderately impaired on the right and mild to moderately impaired on the left. Hand movements are moderately impaired on the right and mild to moderately impaired on the left. RAP (rapid alternating patting) is mildly impaired on the right and mildly impaired on the left. Foot taps are moderate to severely impaired on the right and moderately impaired on the left. Foot agility (in the form of heel stomping) is moderately impaired on the right and  moderately impaired on  the left.    Cerebellar testing shows no dysmetria or intention tremor on finger to nose testing. Heel to shin is not possible for her. There is no truncal or gait ataxia.   Sensory exam is intact to light touch, pinprick, vibration, temperature sense in the upper and lower extremities.   Gait, station and balance: She stands up from the seated position with significant difficulty and needs to push herself up with her hands and also requires assistance. She leans slightly to the R. Posture is moderately stooped. Stance is wide-based. She walks with significant decrease in stride length and pace and has to rely on her rolling walker which she brought in today, but maneuvers it well. Tandem walk is not possible. Balance is moderately impaired. She is not able to do a toe or heel stance. She has mild trouble turning.    Assessment and Plan:   In summary, Andrea Dorsey is a very pleasant 80 year old female with an underlying medical history of spinal stenosis, hypothyroidism, osteoarthritis, hypertension, depression, obesity, necrotizing fasciitis of her right leg, history of cellulitis of her jaw, who presents for FU consultation of her advanced, right sided predominant Parkinson's, complicated by hallucinations, anxiety, depression, insomnia and RBD, lower extremity swelling and overall deconditioning. She has had 2 recent episodes of decreased attentiveness or decreased responsiveness or staring spells of unclear etiology. This could have been a freezing spell but she also endorses significant anxiety and mild depression. I will go ahead and do an EEG. She's had multiple head CTs and brain scans including MRA head which we all reviewed today. She has actually remained fairly stable for me. She has been in assisted living since late September 2014, but is now transitioning to a town home where she will have a caregiver day and night and family will stay with her on those other nights or days when she does  not have a caregiver. She is looking forward to living in her own place. I think this will help her but she also needs the supervision. I would like to keep her on Stalevo at the current dose but we will try to make sure she takes it every 4 hours starting at 8 AM. At that time I would like to introduce Sinemet CR 50/200 mg strength once each night to help her through the night a little bit better. She does still have insomnia. I think at this point I would also like for her to start a small dose of an antidepressant. I prescribed Lexapro 5 mg strength. I provided written information regarding potential side effects including the concern for worsening depression and sometimes suicidal ideations in the elderly. I suggested we make these changes when she has transitioned into her new living situation and has had a chance to settle down. She is moving by the end of this month. We will schedule the EEG and call her with results. I provided to written prescriptions for Lexapro and for Sinemet CR and we will keep everything else the same. She and [name] were in agreement. We talked about the challenges in her case particularly what with her advanced age, complex medical history side, effects with medications and medication increases in the past and progression of her parkinsonian signs and symptoms. Parcopa will be one pill up to twice daily in between her Stalevo doses as needed. I will see her back in 3 months, sooner if the need arises.

## 2013-09-08 ENCOUNTER — Ambulatory Visit: Payer: Medicare Other | Admitting: Nurse Practitioner

## 2013-09-19 ENCOUNTER — Other Ambulatory Visit: Payer: Medicare Other

## 2013-09-22 ENCOUNTER — Ambulatory Visit (INDEPENDENT_AMBULATORY_CARE_PROVIDER_SITE_OTHER): Payer: Medicare Other

## 2013-09-22 DIAGNOSIS — R6889 Other general symptoms and signs: Secondary | ICD-10-CM

## 2013-09-22 DIAGNOSIS — G20 Parkinson's disease: Secondary | ICD-10-CM

## 2013-09-22 NOTE — Progress Notes (Signed)

## 2013-09-22 NOTE — Procedures (Signed)
    History:  Andrea Dorsey is an 80 year old patient with a history of Parkinson's disease, associated with some memory loss and hallucinations as well as anxiety and depression. The patient has had 2 episodes recently of decreased responsiveness, staring episodes. The patient is being evaluated for these events.  This is a routine EEG. No skull defects are noted. Medications include, Tylenol, aspirin, Sinemet, vitamin D, diclofenac, Lexapro, Lasix, hydralazine, Synthroid, melatonin, metoprolol, Prilosec, potassium supplementation, Senokot, Ultram, and guaifenesin.   EEG classification: Normal awake  Description of the recording: The background rhythms of this recording consists of a fairly well modulated medium amplitude alpha rhythm of 8 Hz that is reactive to eye opening and closure. As the record progresses, the patient appears to remain in the waking state throughout the recording. Photic stimulation was performed, resulting in a bilateral and symmetric photic driving response. Hyperventilation was also performed, resulting in a minimal buildup of the background rhythm activities without significant slowing seen. At no time during the recording does there appear to be evidence of spike or spike wave discharges or evidence of focal slowing. EKG monitor shows no evidence of cardiac rhythm abnormalities with a heart rate of 60.  Impression: This is a normal EEG recording in the waking state. No evidence of ictal or interictal discharges are seen.

## 2013-09-23 ENCOUNTER — Telehealth: Payer: Self-pay | Admitting: Neurology

## 2013-09-23 NOTE — Telephone Encounter (Signed)
Called pt and left message for pt to give our office a call back for EEG results.

## 2013-09-23 NOTE — Telephone Encounter (Signed)
Pt's daughter [name] called back to get pt's EEG and brain wave results. I informed the daughter that the pt's results were normal and if the pt has any other problems, questions or concerns to call the office. Daughter verbalized understanding.

## 2013-10-20 ENCOUNTER — Ambulatory Visit: Payer: Medicare Other | Admitting: Internal Medicine

## 2013-10-27 ENCOUNTER — Ambulatory Visit (INDEPENDENT_AMBULATORY_CARE_PROVIDER_SITE_OTHER): Payer: Medicare Other | Admitting: Internal Medicine

## 2013-10-27 ENCOUNTER — Encounter: Payer: Self-pay | Admitting: Internal Medicine

## 2013-10-27 VITALS — BP 162/80 | HR 77 | Temp 97.9°F | Resp 20 | Ht 64.0 in | Wt 219.8 lb

## 2013-10-27 DIAGNOSIS — I5033 Acute on chronic diastolic (congestive) heart failure: Secondary | ICD-10-CM

## 2013-10-27 DIAGNOSIS — Z23 Encounter for immunization: Secondary | ICD-10-CM

## 2013-10-27 DIAGNOSIS — G20 Parkinson's disease: Secondary | ICD-10-CM

## 2013-10-27 DIAGNOSIS — F0280 Dementia in other diseases classified elsewhere without behavioral disturbance: Secondary | ICD-10-CM

## 2013-10-27 DIAGNOSIS — E038 Other specified hypothyroidism: Secondary | ICD-10-CM

## 2013-10-27 DIAGNOSIS — K5901 Slow transit constipation: Secondary | ICD-10-CM

## 2013-10-27 DIAGNOSIS — G20A1 Parkinson's disease without dyskinesia, without mention of fluctuations: Secondary | ICD-10-CM

## 2013-10-27 DIAGNOSIS — I10 Essential (primary) hypertension: Secondary | ICD-10-CM

## 2013-10-27 DIAGNOSIS — E669 Obesity, unspecified: Secondary | ICD-10-CM

## 2013-10-27 DIAGNOSIS — F22 Delusional disorders: Secondary | ICD-10-CM

## 2013-10-27 DIAGNOSIS — E034 Atrophy of thyroid (acquired): Secondary | ICD-10-CM

## 2013-10-27 LAB — POCT URINALYSIS DIPSTICK
Blood, UA: NEGATIVE
Glucose, UA: NEGATIVE
Leukocytes, UA: NEGATIVE
Nitrite, UA: NEGATIVE
Spec Grav, UA: 1.025
Urobilinogen, UA: NEGATIVE
pH, UA: 5

## 2013-10-27 NOTE — Addendum Note (Signed)
Addended by: Eilene Ghazi on: 10/27/2013 09:43 AM   Modules accepted: Orders

## 2013-10-27 NOTE — Progress Notes (Signed)
Patient ID: Andrea Dorsey, female   DOB: 1929/03/21, 80 y.o.   MRN: [id number]   Location:  [facility]  Code Status: DNR  Allergies  Allergen Reactions  . Ivp Dye [Iodinated Diagnostic Agents]     Only when intravenous, not on external skin.  . Clindamycin Rash    Unclear whether patient has an actual allergy to clindamycin. On beta-lactam at the same time.    Chief Complaint  Patient presents with  . Acute Visit    BP High x 2-3 wks    HPI: Patient is a 80 y.o. black female seen in the office today for an acute visit for hypertension (not new), constipation (also not new, but much worse today).  She has Parkinson's disease.   She has moved home now with a 5 day full time caregiver, Andrea Dorsey.  Pt resistant to doing things on her own.  Pt afraid of falling.  Had one near fall in the shower. Stumbled.     Has had increased indigestion and constipation.  Also more confused and agitated than usual.  Some chest discomfort.  Increased delusions in the morning.  Subsides later in the day.  Nothing scary or worried.  A lot of references to how things were done at [facility] vs. At home now.    Had not had a problem with her bowels since home end of August.  Senna s given.    Does not eat a lot, but eats regularly.  Does have a sweet tooth.  Has no energy to go exercise--refuses.  Her daughter, Andrea Dorsey  Review of Systems:  Review of Systems  Constitutional: Positive for malaise/fatigue. Negative for fever.  HENT: Negative for congestion.   Eyes: Negative for blurred vision.  Respiratory: Positive for shortness of breath.   Cardiovascular: Positive for chest pain. Negative for leg swelling.  Gastrointestinal: Positive for heartburn.  Genitourinary: Negative for frequency.  Musculoskeletal: Negative for falls and myalgias.  Skin: Negative for rash.  Neurological: Positive for dizziness and weakness. Negative for loss of consciousness.    Psychiatric/Behavioral: Negative for memory loss.       Does moaning sound     Past Medical History  Diagnosis Date  . Benign essential hypertension   . Hypothyroidism   . Osteoarthritis, generalized   . Parkinson disease   . Spinal stenosis   . History of necrotizing fasciitis     left leg, s/p debridement and graft  . Dementia in Parkinson's disease   . Depression     Past Surgical History  Procedure Laterality Date  . Skin debridement  2014    Brambhelt, MD  . Skin graft  2014    Brambhelt MD  . Spine surgery  2006    spinal stenosis  . Abdominal hysterectomy  1977    Social History:   reports that she has never smoked. She has never used smokeless tobacco. She reports that she does not drink alcohol or use illicit drugs.  Family History  Problem Relation Age of Onset  . Heart disease Mother   . Heart disease Sister   . Hypertension Sister   . Stroke Sister   . Heart disease Sister     heart attack  . Cancer Sister     colon    Medications: Patient's Medications  New Prescriptions   No medications on file  Previous Medications   ACETAMINOPHEN (TYLENOL) 500 MG TABLET    Take 500 mg by mouth every 6 (  six) hours as needed for mild pain.    ASPIRIN EC 81 MG TABLET    Take 81 mg by mouth daily.   CARBIDOPA-LEVODOPA (PARCOPA) 25-100 MG PER DISINTEGRATING TABLET    Take 1 pill under the tongue up to twice daily.   CARBIDOPA-LEVODOPA (SINEMET CR) 50-200 MG PER TABLET    Take 1 tablet by mouth at bedtime.   CARBIDOPA-LEVODOPA-ENTACAPONE (STALEVO) 37.5-150-200 MG PER TABLET    Take 1 tablet by mouth 4 (four) times daily.   CHOLECALCIFEROL (VITAMIN D3) 2000 UNITS TABS    Take 2,000 Units by mouth daily.    DICLOFENAC SODIUM (VOLTAREN) 1 % GEL    Apply 4 g topically 2 (two) times daily. for knee pain   ESCITALOPRAM (LEXAPRO) 5 MG TABLET    Take 1 tablet (5 mg total) by mouth daily.   FUROSEMIDE (LASIX) 20 MG TABLET    Take 20 mg by mouth. As needed or if weight gain  is more than 3 lbs in one day   GUAIFENESIN (ROBITUSSIN) 100 MG/5ML SYRUP    Take 200 mg by mouth at bedtime as needed for cough.    HYDRALAZINE (APRESOLINE) 25 MG TABLET    Take 25 mg by mouth 3 (three) times daily.   LEVOTHYROXINE (SYNTHROID, LEVOTHROID) 25 MCG TABLET    Take 25 mcg by mouth daily before breakfast.   MELATONIN 10 MG CAPS    Take 10 mg by mouth. Daily at night, around 8:00 pm   METOPROLOL SUCCINATE (TOPROL-XL) 25 MG 24 HR TABLET    Take 50 mg by mouth daily.   NYSTATIN (MYCOSTATIN) POWDER    Apply to affected bilateral inguinal areas 2 times daily. Id # [id number]   OMEPRAZOLE (PRILOSEC) 20 MG CAPSULE    Take 20 mg by mouth daily.   POTASSIUM CHLORIDE SA (K-DUR,KLOR-CON) 20 MEQ TABLET    Take 20 mEq by mouth every three (3) days as needed (fluid retention).    SENNA-DOCUSATE (SENOKOT-S) 8.6-50 MG PER TABLET    Take 2 tablets by mouth daily as needed for mild constipation.   TRAMADOL (ULTRAM) 50 MG TABLET    Take 50 mg by mouth every 8 (eight) hours as needed for moderate pain.   TRIAMCINOLONE (KENALOG) 0.025 % CREAM    Apply 1 application topically as needed (rash).   Modified Medications   No medications on file  Discontinued Medications   NYSTATIN (MYCOSTATIN) POWDER    Apply topically at bedtime.     Physical Exam: Filed Vitals:   10/27/13 0814  BP: 162/80  Pulse: 77  Temp: 97.9 F (36.6 C)  TempSrc: Oral  Resp: 20  Height: 5\' 4"  (1.626 m)  Weight: 219 lb 12.8 oz (99.701 kg)  SpO2: 99%  Physical Exam  Constitutional: She appears well-developed and well-nourished. No distress.  Cardiovascular:  irreg irreg  Pulmonary/Chest: Effort normal and breath sounds normal.  Abdominal: Soft. Bowel sounds are normal. She exhibits distension. She exhibits no mass. There is tenderness.  Genitourinary: Guaiac negative stool.  Musculoskeletal:  Weakness of proximal thighs  Neurological: She is alert.  Confused, said to be worse lately  Skin:  Has graft site on her leg     Labs reviewed: Basic Metabolic Panel:  Recent Labs  11/08/12 1940  03/30/13 1631  07/14/13 1648 07/15/13 0358 09/01/13 0530 09/03/13 0550  NA  --   < > 143  < > 144 141 144 142  K  --   < > 5.3*  < >  5.1 3.9 4.3 4.4  CL  --   < > 103  < > 103 104 109 107  CO2  --   < > 25  < > 23 23 23 24   GLUCOSE  --   < > 97  < > 106* 114* 105* 104*  BUN  --   < > 21  < > 21 20 18 20   CREATININE 1.05  < > 1.23*  < > 1.32* 1.01 1.07 1.05  CALCIUM  --   < > 10.4*  < > 10.3 9.9 9.4 9.8  TSH 4.929*  --  2.650  --  4.060  --   --   --   < > = values in this interval not displayed. Liver Function Tests:  Recent Labs  03/15/13 1524  07/14/13 1648 09/01/13 0530 09/03/13 0550  AST 16  < > 16 17 16   ALT <5  < > 6 <5 <5  ALKPHOS 93  < > 101 89 81  BILITOT 0.8  < > 0.9 1.0 1.0  PROT 7.0  < > 6.4 6.1 6.0  ALBUMIN 3.5  --   --  3.2* 3.2*  < > = values in this interval not displayed. No results found for this basename: LIPASE, AMYLASE,  in the last 8760 hours No results found for this basename: AMMONIA,  in the last 8760 hours CBC:  Recent Labs  04/08/13 1054 07/14/13 1648 07/15/13 0358 09/01/13 0530 09/03/13 0550  WBC 6.4 5.9 5.1 5.6 5.1  NEUTROABS 4.5 3.1  --  3.0  --   HGB 16.7* 14.9 14.5 14.9 14.4  HCT 49.4* 45.0 43.4 44.4 43.6  MCV 88 87 86.6 85.7 85.7  PLT  --  218 191 202 172   Lipid Panel: No results found for this basename: CHOL, HDL, LDLCALC, TRIG, CHOLHDL, LDLDIRECT,  in the last 8760 hours Lab Results  Component Value Date   HGBA1C 6.1* 07/14/2013   Assessment/Plan 1. Acute on chronic diastolic CHF (congestive heart failure) -diurese for 3 days with lasix 20mg  (along with kcl) as up 5 lbs (seems to be in abdomen /sacrum instead of legs and lungs this time) - CBC With differential/Platelet - Comprehensive metabolic panel  2. Slow transit constipation -cont senna as needed, use fleets for current episode due to abdominal pain (also bloated from fluid I  believe)  3. Dementia in Parkinson's disease -progressing I suspect with increased delusions  4. Parkinson disease -her daughter has requested more therapy for her - Ambulatory referral to [facility]  5. Essential hypertension, benign -bp elevated, but drops low on standing from her PD  6. Hypothyroidism due to acquired atrophy of thyroid Cont synthroid current dose - TSH  7. Delusions -r/o any acute process--suspect chf and constipation related - CBC With differential/Platelet - Comprehensive metabolic panel - POCT urinalysis dipstick  8. Obesity -not exercising since she went home and weakness increasing so PT ordered at home - Comprehensive metabolic panel - Hemoglobin A1c  9. Need for prophylactic vaccination and inoculation against influenza Flu shot given    Labs/tests ordered: Orders Placed This Encounter  Procedures  . CBC With differential/Platelet  . Comprehensive metabolic panel  . Hemoglobin A1c  . TSH  . Ambulatory referral to Home Health    Referral Priority:  Routine    Referral Type:  Home Health Care    Referral Reason:  Specialty Services Required    Requested Specialty:  [place]    Number of Visits  Requested:  1  . POCT urinalysis dipstick    Next appt:  3 mos for med mgt   Idora Brosious L. July Nickson, D.O. [facility] [address], [place] [address] Cell Phone (Mon-Fri 8am-5pm):  [phone] On Call:  [phone] & follow prompts after 5pm & weekends Office Phone:  [phone] Office Fax:  [phone]

## 2013-10-27 NOTE — Patient Instructions (Signed)
Give lasix 20mg  daily and kcl 73meq daily for 3 days to help with volume overload from heart failure.   Please restart doing daily weights so you can tell if she loses weight from the diuretic.   I have ordered home health therapy. Also please give her a fleets enema to help with her constipation.

## 2013-10-28 LAB — CBC WITH DIFFERENTIAL
Basophils Absolute: 0 10*3/uL (ref 0.0–0.2)
Basos: 0 %
Eos: 1 %
Eosinophils Absolute: 0.1 10*3/uL (ref 0.0–0.4)
HCT: 48.3 % — ABNORMAL HIGH (ref 34.0–46.6)
Hemoglobin: 16.2 g/dL — ABNORMAL HIGH (ref 11.1–15.9)
Immature Grans (Abs): 0 10*3/uL (ref 0.0–0.1)
Immature Granulocytes: 0 %
Lymphocytes Absolute: 1 10*3/uL (ref 0.7–3.1)
Lymphs: 13 %
MCH: 29.4 pg (ref 26.6–33.0)
MCHC: 33.5 g/dL (ref 31.5–35.7)
MCV: 88 fL (ref 79–97)
Monocytes Absolute: 0.9 10*3/uL (ref 0.1–0.9)
Monocytes: 11 %
Neutrophils Absolute: 5.6 10*3/uL (ref 1.4–7.0)
Neutrophils Relative %: 75 %
Platelets: 171 10*3/uL (ref 150–379)
RBC: 5.51 x10E6/uL — ABNORMAL HIGH (ref 3.77–5.28)
RDW: 15.7 % — ABNORMAL HIGH (ref 12.3–15.4)
WBC: 7.5 10*3/uL (ref 3.4–10.8)

## 2013-10-28 LAB — COMPREHENSIVE METABOLIC PANEL
ALT: 13 IU/L (ref 0–32)
AST: 41 IU/L — ABNORMAL HIGH (ref 0–40)
Albumin/Globulin Ratio: 1.7 (ref 1.1–2.5)
Albumin: 3.8 g/dL (ref 3.5–4.7)
Alkaline Phosphatase: 86 IU/L (ref 39–117)
BUN/Creatinine Ratio: 16 (ref 11–26)
BUN: 20 mg/dL (ref 8–27)
CO2: 25 mmol/L (ref 18–29)
Calcium: 10 mg/dL (ref 8.7–10.3)
Chloride: 103 mmol/L (ref 97–108)
Creatinine, Ser: 1.24 mg/dL — ABNORMAL HIGH (ref 0.57–1.00)
GFR calc Af Amer: 47 mL/min/{1.73_m2} — ABNORMAL LOW (ref >59)
GFR calc non Af Amer: 41 mL/min/{1.73_m2} — ABNORMAL LOW (ref >59)
Globulin, Total: 2.2 g/dL (ref 1.5–4.5)
Glucose: 126 mg/dL — ABNORMAL HIGH (ref 65–99)
Potassium: 4.1 mmol/L (ref 3.5–5.2)
Sodium: 142 mmol/L (ref 134–144)
Total Bilirubin: 1.2 mg/dL (ref 0.0–1.2)
Total Protein: 6 g/dL (ref 6.0–8.5)

## 2013-10-28 LAB — HEMOGLOBIN A1C
Est. average glucose Bld gHb Est-mCnc: 126 mg/dL
Hgb A1c MFr Bld: 6 % — ABNORMAL HIGH (ref 4.8–5.6)

## 2013-10-28 LAB — TSH: TSH: 3.33 u[IU]/mL (ref 0.450–4.500)

## 2013-11-01 DIAGNOSIS — G20 Parkinson's disease: Secondary | ICD-10-CM

## 2013-11-01 DIAGNOSIS — M159 Polyosteoarthritis, unspecified: Secondary | ICD-10-CM

## 2013-11-01 DIAGNOSIS — F0280 Dementia in other diseases classified elsewhere without behavioral disturbance: Secondary | ICD-10-CM

## 2013-11-01 DIAGNOSIS — I10 Essential (primary) hypertension: Secondary | ICD-10-CM

## 2013-11-01 DIAGNOSIS — M4800 Spinal stenosis, site unspecified: Secondary | ICD-10-CM

## 2013-11-01 DIAGNOSIS — I5033 Acute on chronic diastolic (congestive) heart failure: Secondary | ICD-10-CM

## 2013-11-07 ENCOUNTER — Ambulatory Visit: Payer: Medicare Other | Admitting: Neurology

## 2013-11-10 ENCOUNTER — Encounter: Payer: Self-pay | Admitting: Nurse Practitioner

## 2013-11-10 ENCOUNTER — Ambulatory Visit (INDEPENDENT_AMBULATORY_CARE_PROVIDER_SITE_OTHER): Payer: Medicare Other | Admitting: Nurse Practitioner

## 2013-11-10 VITALS — BP 140/70 | HR 66 | Temp 97.4°F | Ht 64.0 in | Wt 218.0 lb

## 2013-11-10 DIAGNOSIS — I5033 Acute on chronic diastolic (congestive) heart failure: Secondary | ICD-10-CM

## 2013-11-10 NOTE — Patient Instructions (Signed)
Important to get scale and weight daily

## 2013-11-10 NOTE — Progress Notes (Signed)
Patient ID: Andrea Dorsey, female   DOB: Jan 17, 1930, 80 y.o.   MRN: [id number]    PCP: Hollace Kinnier, DO  Allergies  Allergen Reactions  . Ivp Dye [Iodinated Diagnostic Agents]     Only when intravenous, not on external skin.  . Clindamycin Rash    Unclear whether patient has an actual allergy to clindamycin. On beta-lactam at the same time.    Chief Complaint  Patient presents with  . Medical Management of Chronic Issues    Pt. whezzing in the AM X 4 days    HPI:  Patient is a 80 y.o. AA female pt of Dr Cyndi Lennert who seen in the office today for an acute visit for wheezing in the morning that started several days ago. Here with caregiver. Caregiver reports she was getting out of breath more easily with walking short distances. Cough was nonproductive. At baseline uses 3 pillows to prop herself up to sleep, has not had to adjust this. No fevers or chills. No worsening of swelling. 4 days ago started giving her lasix 20 mg PO daily x3 days. Breathing, cough and wheezing has improved with this.  Review of Systems:  Review of Systems  Constitutional: Negative for fever, chills, activity change, fatigue and unexpected weight change.  Respiratory: Positive for cough, shortness of breath and wheezing.        Over the past few days, all which has improved   Cardiovascular: Negative for chest pain, palpitations and leg swelling.  Gastrointestinal: Negative for nausea, diarrhea, constipation and abdominal distention.  Genitourinary: Positive for frequency (due to lasix use ). Negative for difficulty urinating.  Neurological: Negative for dizziness.  Psychiatric/Behavioral: Positive for confusion.    Past Medical History  Diagnosis Date  . Benign essential hypertension   . Hypothyroidism   . Osteoarthritis, generalized   . Parkinson disease   . Spinal stenosis   . History of necrotizing fasciitis     left leg, s/p debridement and graft  . Dementia in Parkinson's disease   . Depression     Past Surgical History  Procedure Laterality Date  . Skin debridement  2014    Brambhelt, MD  . Skin graft  2014    Brambhelt MD  . Spine surgery  2006    spinal stenosis  . Abdominal hysterectomy  1977   Social History:   reports that she has never smoked. She has never used smokeless tobacco. She reports that she does not drink alcohol or use illicit drugs.  Family History  Problem Relation Age of Onset  . Heart disease Mother   . Heart disease Sister   . Hypertension Sister   . Stroke Sister   . Heart disease Sister     heart attack  . Cancer Sister     colon    Medications: Patient's Medications  New Prescriptions   No medications on file  Previous Medications   ACETAMINOPHEN (TYLENOL) 500 MG TABLET    Take 500 mg by mouth every 6 (six) hours as needed for mild pain.    ASPIRIN EC 81 MG TABLET    Take 81 mg by mouth daily.   CARBIDOPA-LEVODOPA (PARCOPA) 25-100 MG PER DISINTEGRATING TABLET    Take 1 pill under the tongue up to twice daily.   CARBIDOPA-LEVODOPA (SINEMET CR) 50-200 MG PER TABLET    Take 1 tablet by mouth at bedtime.   CARBIDOPA-LEVODOPA-ENTACAPONE (STALEVO) 37.5-150-200 MG PER TABLET    Take 1 tablet by mouth 4 (four) times daily.  CHOLECALCIFEROL (VITAMIN D3) 2000 UNITS TABS    Take 2,000 Units by mouth daily.    DICLOFENAC SODIUM (VOLTAREN) 1 % GEL    Apply 4 g topically 2 (two) times daily. for knee pain   ESCITALOPRAM (LEXAPRO) 5 MG TABLET    Take 1 tablet (5 mg total) by mouth daily.   FUROSEMIDE (LASIX) 20 MG TABLET    Take 20 mg by mouth. As needed or if weight gain is more than 3 lbs in one day   GUAIFENESIN (ROBITUSSIN) 100 MG/5ML SYRUP    Take 200 mg by mouth at bedtime as needed for cough.    HYDRALAZINE (APRESOLINE) 25 MG TABLET    Take 25 mg by mouth 3 (three) times daily.   LEVOTHYROXINE (SYNTHROID, LEVOTHROID) 25 MCG TABLET    Take 25 mcg by mouth daily before breakfast.   MELATONIN 10 MG CAPS    Take 10 mg by mouth. Daily at night,  around 8:00 pm   METOPROLOL SUCCINATE (TOPROL-XL) 25 MG 24 HR TABLET    Take 50 mg by mouth 2 (two) times daily.    NYSTATIN (MYCOSTATIN) POWDER    Apply to affected bilateral inguinal areas 2 times daily. Id # [id number]   OMEPRAZOLE (PRILOSEC) 20 MG CAPSULE    Take 20 mg by mouth daily.   POTASSIUM CHLORIDE SA (K-DUR,KLOR-CON) 20 MEQ TABLET    Take 20 mEq by mouth every three (3) days as needed (Only takes with fluid pill).    SENNA-DOCUSATE (SENOKOT-S) 8.6-50 MG PER TABLET    Take 2 tablets by mouth daily as needed for mild constipation.   TRAMADOL (ULTRAM) 50 MG TABLET    Take 50 mg by mouth every 8 (eight) hours as needed for moderate pain.   TRIAMCINOLONE (KENALOG) 0.025 % CREAM    Apply 1 application topically as needed (rash).   Modified Medications   No medications on file  Discontinued Medications   No medications on file     Physical Exam: Filed Vitals:   11/10/13 1435  BP: 140/70  Pulse: 66  Temp: 97.4 F (36.3 C)  TempSrc: Oral  Height: 5\' 4"  (1.626 m)  Weight: 218 lb (98.884 kg)  SpO2: 91%    Physical Exam  Constitutional: She appears well-developed and well-nourished. No distress.  Neck: Normal range of motion. Neck supple. No JVD present.  Cardiovascular: Normal rate and normal heart sounds.   irreg irreg  Pulmonary/Chest: Effort normal and breath sounds normal.  Abdominal: Soft. Bowel sounds are normal. She exhibits no distension and no mass. There is no tenderness.  Musculoskeletal: She exhibits no edema and no tenderness.  Weakness of proximal thighs  Neurological: She is alert.  Skin:  Has graft site on her leg    Labs reviewed: Basic Metabolic Panel:  Recent Labs  09/01/13 0530 09/03/13 0550 10/27/13 0933  NA 144 142 142  K 4.3 4.4 4.1  CL 109 107 103  CO2 23 24 25   GLUCOSE 105* 104* 126*  BUN 18 20 20   CREATININE 1.07 1.05 1.24*  CALCIUM 9.4 9.8 10.0   Liver Function Tests:  Recent Labs  03/15/13 1524  09/01/13 0530 09/03/13 0550  10/27/13 0933  AST 16  < > 17 16 41*  ALT <5  < > <5 <5 13  ALKPHOS 93  < > 89 81 86  BILITOT 0.8  < > 1.0 1.0 1.2  PROT 7.0  < > 6.1 6.0 6.0  ALBUMIN 3.5  --  3.2* 3.2*  --   < > =  values in this interval not displayed. No results found for this basename: LIPASE, AMYLASE,  in the last 8760 hours No results found for this basename: AMMONIA,  in the last 8760 hours CBC:  Recent Labs  07/14/13 1648  09/01/13 0530 09/03/13 0550 10/27/13 0933  WBC 5.9  < > 5.6 5.1 7.5  NEUTROABS 3.1  --  3.0  --  5.6  HGB 14.9  < > 14.9 14.4 16.2*  HCT 45.0  < > 44.4 43.6 48.3*  MCV 87  < > 85.7 85.7 88  PLT 218  < > 202 172 171  < > = values in this interval not displayed. TSH:  Recent Labs  03/30/13 1631 07/14/13 1648 10/27/13 0933  TSH 2.650 4.060 3.330   A1C: Lab Results  Component Value Date   HGBA1C 6.0* 10/27/2013   Lipid Panel: No results found for this basename: CHOL, HDL, LDLCALC, TRIG, CHOLHDL, LDLDIRECT,  in the last 8760 hours    Assessment/Plan  1. Acute on chronic diastolic CHF (congestive heart failure) -family already has given lasix 20 mg daily along with KCL with much improvement. -lungs clear and breathing as improved. Will cont to observe for now -to get scale and weigh daily, lasix to be giving PRN weight gain  -follow up precautions discussed. To follow up as needed

## 2013-11-25 ENCOUNTER — Other Ambulatory Visit: Payer: Self-pay | Admitting: Internal Medicine

## 2013-12-06 ENCOUNTER — Ambulatory Visit: Payer: Medicare Other | Admitting: Neurology

## 2013-12-07 ENCOUNTER — Encounter: Payer: Self-pay | Admitting: Neurology

## 2013-12-07 ENCOUNTER — Ambulatory Visit (INDEPENDENT_AMBULATORY_CARE_PROVIDER_SITE_OTHER): Payer: Medicare Other | Admitting: Neurology

## 2013-12-07 VITALS — BP 163/87 | HR 63 | Temp 97.5°F | Resp 14 | Ht 65.0 in | Wt 212.0 lb

## 2013-12-07 DIAGNOSIS — F329 Major depressive disorder, single episode, unspecified: Secondary | ICD-10-CM

## 2013-12-07 DIAGNOSIS — R6889 Other general symptoms and signs: Secondary | ICD-10-CM

## 2013-12-07 DIAGNOSIS — G20 Parkinson's disease: Secondary | ICD-10-CM

## 2013-12-07 DIAGNOSIS — F419 Anxiety disorder, unspecified: Secondary | ICD-10-CM

## 2013-12-07 DIAGNOSIS — F32A Depression, unspecified: Secondary | ICD-10-CM

## 2013-12-07 MED ORDER — ESCITALOPRAM OXALATE 10 MG PO TABS: 10.0000 mg | ORAL_TABLET | Freq: Every day | ORAL | Status: DC

## 2013-12-07 NOTE — Patient Instructions (Signed)
Please drink more water during the day.   We will increase your Lexapro to 10 mg daily.   We will keep your other medications the same.

## 2013-12-07 NOTE — Progress Notes (Signed)
Subjective:    Patient ID: Andrea Dorsey is a 80 y.o. female.  HPI     Interim history:  Andrea Dorsey is a very pleasant 80 year old right-handed woman with an underlying complex medical history of spinal stenosis, necrotizing fasciitis, status post debridement and grafting on the right lower leg, lower extremity edema, insomnia, hypothyroidism, osteoarthritis, hypertension, depression, and obesity, who presents for followup consultation of her advanced, right-sided predominant Parkinson's disease, complicated by hallucinations, memory loss, mood disorder including anxiety and depression, sleep disorder including insomnia, OSA and RBD. She is accompanied by her daughter and a caretaker today. I last saw her on 09/07/13, at which time her daughter reported no recent staring or zoning out spells. She was in the process of moving to a townhome with a caretaker for 4 days and 3 nights and family staying with her the rest of the time. I ordered an EEG. I also asked her to take Sinemet CR at night to get her through the night a little bit better. I started her on a low-dose Lexapro 5 mg strength. Her EEG on 09/22/2013 was reported as normal in the awake state. Her daughter requested a sooner appointment for more hallucinations and confusion. She has more memory loss.   Today, I talked to the patient's daughter, Andrea Dorsey, separately before seeing the patient. Her daughter is concerned about the patient's general decline. She has had more dream enactments. The patient feels that her symptoms are getting worse. Her biggest worry is that she cannot sleep at night. Of note she tends to doze off during the day. She has thankfully not fallen. She still takes Stalevo 150 milligrams 4 times a day. Unfortunately, she could not tolerate clonazepam which was stopped earlier this year, and she had side effects from low-dose Seroquel as well. Melatonin at 10 mg does not seem to help. She does not drink very much water at all.  Maybe a total of 16-20 ounces a day. She has lost a little bit of weight. She does like her new home. She has 3 caretakers that take turns. On the weekend her family provides supervision and help.  I saw her on 06/08/2013, at which time I suggested she continue with Stalevo 150 4 times a day. She previously has had hallucinations with increased doses. I suggested adding a little extra levodopa in the form of Parcopa half a pill up to twice daily as needed. This was supposed to help with freezing. In the interim, about a month later on 07/13/2013 she was seen by Charlott Holler, NP for a sooner than scheduled appointment at which time the Parcopa dose was increased to one whole pill up to twice daily for rigidity and freezing. She presented to the emergency room on 07/15/2013 with generalized complaint of weakness and freezing and was advised to increase her Parcopa. She presented voice recently this month to the emergency room with altered sensorium, or staring spells. Workup with head CT and MRIs were negative. It was felt that these were related to Parkinson's disease. She had a brain MRI and MRA without contrast on 09/03/2013:No acute intracranial abnormality. 2. Cerebral atrophy and single remote microhemorrhage in the left frontal lobe. 3. Unremarkable head MRA. In addition, have reviewed the images through the PACS system. She had head CT without contrast on 09/03/2013:No acute intracranial pathology seen on CT. 2. Inspissated mucus filling the left maxillary sinus, and mild partial opacification of the mastoid air cells bilaterally. She had head CT without contrast on 09/01/2013: No  acute intracranial pathology seen on CT. 2. Mild cortical volume loss noted. 3. Mild partial opacification of the mastoid air cells bilaterally. In addition, reviewed the images through the PACS system.  I saw her on 02/03/13, at which time I increased her Stalevo to 5 times a day. We talked about potential side effects. I asked her  to stop the Seroquel and started her on low-dose clonazepam for insomnia and RBD. In the interim she was seen by our nurse practitioner, Ms. Lam on 04/08/2013, at which time I also saw her, and we mutually decided to decrease Stalevo back to 4 times a day because of worsening confusion and hallucinations. Her clonazepam was discontinued by her PCP because of side effects. I suggested a trial of melatonin, 5-10 mg. We checked some labs including CBC, CMP, CRP, ESR and urinalysis. Labs showed no significant abnormalities, mild but stable kidney impairment was noted. She was encouraged to drink more water. She has seen her PCP in April 2015 and was advised to take her fluid pill as needed, an increase of melatonin to 10 mg.   I first met her on 10/15/2012, at which time a continued her Stalevo. I suggested a small dose of Seroquel to help her sleep and tone down the REM behavior disorder and encouraged him to discuss with her primary care physician the addition of an antidepressant. She presents with a complaint of worsening tremors.   She has been in [facility] [address] since 10/18/12. She has a Hx of vivid dreams and tends to act out in her sleep.   She previously used to see a neurologist at [facility], when she lived in [place], [place]. She was diagnosed with PD about 12 years ago when she was still residing in [place]. She needs assistance with her ADLs. She has been living with her daughter and son-in-law and they have looked into the possibility of a long-term care facility but the patient has been resistant. She has had problems at night including sundowning, confusion, inability to sleep.   Her symptoms started on one side with tremors, but the patient was not sure which side. She has been on Stalevo for the past 2 years, and prior to that she was on C/L, and prior to that she was on Amantadine. She may not have tried a dopamine agonist or rasagiline in the past. She has been experiencing  nausea with her PD medications and still has occasional nausea. In March 2014 she developed necrotizing fasciitis and needed debridement and grafting. She developed hallucinations at the time, but was on pain medications at the time, but the [facility] persisted beyond that. She also started having memory loss then. She developed cellulitis with complications in her jaw and needed all remaining teeth removed and had IV antibiotics in mid-2014. She has no FHx of PD or dementia. She has no Hx of psychiatric premorbid illness. In 2006 she had back surgery. She has no exposure to chemicals, or agent orange. She has been an anxious person. She is not able to sleep at night. She was tried on Ambien and amitriptyline. She has difficulty with sleep onset and sleep maintenance.   She has occasional urinary incontinence, occasional constipation. She snores, and needed to have a sleep study, but did not go. She has had some dream enactments and has slid out of bed.   She had been very opposed to going into assisted living, but understood that she given her complex medical history  and multiple issues and advanced Parkinson's disease she was no longer safe to live by herself.   Her Past Medical History Is Significant For: Past Medical History  Diagnosis Date  . Benign essential hypertension   . Hypothyroidism   . Osteoarthritis, generalized   . Parkinson disease   . Spinal stenosis   . History of necrotizing fasciitis     left leg, s/p debridement and graft  . Dementia in Parkinson's disease   . Depression     Her Past Surgical History Is Significant For: Past Surgical History  Procedure Laterality Date  . Skin debridement  2014    Brambhelt, MD  . Skin graft  2014    Brambhelt MD  . Spine surgery  2006    spinal stenosis  . Abdominal hysterectomy  1977    Her Family History Is Significant For: Family History  Problem Relation Age of Onset  . Heart disease Mother   . Heart disease Sister   .  Hypertension Sister   . Stroke Sister   . Heart disease Sister     heart attack  . Cancer Sister     colon    Her Social History Is Significant For: History   Social History  . Marital Status: Widowed    Spouse Name: N/A    Number of Children: 2  . Years of Education: 12   Occupational History  .      retired    Social History Main Topics  . Smoking status: Never Smoker   . Smokeless tobacco: Never Used  . Alcohol Use: No  . Drug Use: No  . Sexual Activity: Not Currently   Other Topics Concern  . None   Social History Narrative   Patient lives in assisted living.patient is right handed    Her Allergies Are:  Allergies  Allergen Reactions  . Ivp Dye [Iodinated Diagnostic Agents]     Only when intravenous, not on external skin.  . Clindamycin Rash    Unclear whether patient has an actual allergy to clindamycin. On beta-lactam at the same time.  :   Her Current Medications Are:  Outpatient Encounter Prescriptions as of 12/07/2013  Medication Sig  . acetaminophen (TYLENOL) 500 MG tablet Take 500 mg by mouth every 6 (six) hours as needed for mild pain.   Marland Kitchen aspirin EC 81 MG tablet Take 81 mg by mouth daily.  . carbidopa-levodopa (PARCOPA) 25-100 MG per disintegrating tablet Take 1 pill under the tongue up to twice daily.  . carbidopa-levodopa (SINEMET CR) 50-200 MG per tablet Take 1 tablet by mouth at bedtime.  . carbidopa-levodopa-entacapone (STALEVO) 37.5-150-200 MG per tablet Take 1 tablet by mouth 4 (four) times daily.  . Cholecalciferol (VITAMIN D3) 2000 UNITS TABS Take 2,000 Units by mouth daily.   . diclofenac sodium (VOLTAREN) 1 % GEL Apply 4 g topically 2 (two) times daily. for knee pain  . escitalopram (LEXAPRO) 5 MG tablet Take 1 tablet (5 mg total) by mouth daily.  . furosemide (LASIX) 20 MG tablet Take 20 mg by mouth. As needed or if weight gain is more than 3 lbs in one day  . guaifenesin (ROBITUSSIN) 100 MG/5ML syrup Take 200 mg by mouth at bedtime as  needed for cough.   . hydrALAZINE (APRESOLINE) 25 MG tablet TAKE 1 TABLET BY MOUTH 3 TIMES A DAY  . levothyroxine (SYNTHROID, LEVOTHROID) 25 MCG tablet Take 25 mcg by mouth daily before breakfast.  . Melatonin 10 MG CAPS Take 10  mg by mouth. Daily at night, around 8:00 pm  . metoprolol succinate (TOPROL-XL) 25 MG 24 hr tablet Take 50 mg by mouth 2 (two) times daily.   Marland Kitchen nystatin (MYCOSTATIN) powder Apply to affected bilateral inguinal areas 2 times daily. Id # [id number]  . omeprazole (PRILOSEC) 20 MG capsule Take 20 mg by mouth daily.  Marland Kitchen OVER THE COUNTER MEDICATION OTC stool softener prn  . OVER THE COUNTER MEDICATION PB8 Probiotic daily  . potassium chloride SA (K-DUR,KLOR-CON) 20 MEQ tablet Take 20 mEq by mouth every three (3) days as needed (Only takes with fluid pill).   . senna-docusate (SENOKOT-S) 8.6-50 MG per tablet Take 2 tablets by mouth daily as needed for mild constipation.  . traMADol (ULTRAM) 50 MG tablet Take 50 mg by mouth every 8 (eight) hours as needed for moderate pain.  Marland Kitchen triamcinolone (KENALOG) 0.025 % cream Apply 1 application topically as needed (rash).   :  Review of Systems:  Out of a complete 14 point review of systems, all are reviewed and negative with the exception of these symptoms as listed below:   Review of Systems  Respiratory: Positive for cough and wheezing.   Gastrointestinal: Positive for nausea and constipation.  Genitourinary:       Incontinence of bladder sometimes  Neurological:       Insomnia, frequent waking, daytime sleepiness, sleep talking  Psychiatric/Behavioral: Positive for hallucinations and confusion. The patient is nervous/anxious.     Objective:  Neurologic Exam  Physical Exam Physical Examination:   Filed Vitals:   12/07/13 1226  BP: 163/87  Pulse: 63  Temp: 97.5 F (36.4 C)  Resp: 14    General Examination: The patient is a very pleasant 80 y.o. female in no acute distress. She is obese. She appears frail and  deconditioned. She is able to provide some of her own her history today.   HEENT: Normocephalic, atraumatic, pupils are equal, round and reactive to light and accommodation. Extraocular tracking shows moderate saccadic breakdown without nystagmus noted. There is limitation to upper gaze. There is mild decrease in eye blink rate. Hearing is impaired mildly. Funduscopic exam is normal, but there is evidence of cataracts bilaterally. Face is symmetric with moderate facial masking and normal facial sensation. There is a mild lower lip and jaw tremor. Neck is moderately rigid with intact passive ROM. There are no carotid bruits on auscultation. Oropharynx exam reveals moderate mouth dryness. There is significant airway crowding noted d/t redundant soft palate and large tongue. She is edentulous. Mallampati is class III. Tongue protrudes centrally and palate elevates symmetrically. There is mild drooling.   Chest: is clear to auscultation without wheezing, rhonchi or crackles noted.  Heart: sounds are regular and normal without murmurs, rubs or gallops noted.   Abdomen: is soft, non-tender and non-distended with normal bowel sounds appreciated on auscultation.  Extremities: There is 2+ pitting edema in the distal lower extremities bilaterally. Pedal pulses are intact. Chronic stasis-like changes are noted in the distal legs bilaterally with s/p skin grafting on the right. There are no varicose veins.  Skin: is warm and dry with no trophic changes noted. Age-related changes are noted on the skin.   Musculoskeletal: exam reveals no obvious joint deformities, tenderness, joint swelling or erythema.  Neurologically:  Mental status: The patient is awake and alert, paying fairly good attention. She is able to partially provide the history. Her daughter and her caretaker provide most of the history. She is oriented to: person, place, time/date and  situation. Her memory, attention, language and knowledge are  impaired mildly. There is no aphasia, agnosia, apraxia or anomia. There is a moderate degree of bradyphrenia. Speech is moderately to severely hypophonic with mild dysarthria noted, however speech is edentulous as well. Mood is congruent and affect is anxious.    12/07/2013: MMSE 17/30, CDT: 3/4, AFT: 5/min  Cranial nerves are as described above under HEENT exam. In addition, shoulder shrug is normal with equal shoulder height noted.  Motor exam: Normal bulk, and strength for age is noted. There are no dyskinesias noted.  Tone is mildly rigid with presence of cogwheeling in the right>left upper extremity and in both LEs. There is overall moderate bradykinesia. There is no drift or rebound.  There is an intermittent mild to moderate UE resting tremor on the right and a slight intermittent resting tremor in the left upper extremity, not much changed.  Romberg is not tested.   Reflexes are 1+ in the upper extremities and trace in the knees and absent in the ankle.   Fine motor skills exam: Finger taps are moderately impaired on the right and mild to moderately impaired on the left. Hand movements are moderately impaired on the right and mild to moderately impaired on the left. RAP (rapid alternating patting) is moderately impaired on the right and mildly impaired on the left. Foot taps are moderate to severely impaired on the right and moderately impaired on the left. Foot agility (in the form of heel stomping) is moderately impaired on the right and moderately impaired on the left.    Cerebellar testing shows no dysmetria or intention tremor on finger to nose testing. Heel to shin is not possible for her. There is no truncal or gait ataxia.   Sensory exam is intact to light touch, pinprick, vibration, temperature sense in the upper and lower extremities.   Gait, station and balance: She stands up from the seated position with significant difficulty and needs to push herself up with her hands and also  requires significant assistance. She leans slightly to the R. Posture is moderately stooped. Stance is wide-based. She walks with significant decrease in stride length and pace and has to rely on her rolling walker which she brought in today, but maneuvers it fairly well after some start hesitation. Tandem walk is not possible. Balance is moderately impaired. She is not able to do a toe or heel stance. She has mild trouble turning.    Assessment and Plan:   In summary, Andrea Dorsey is a very pleasant 80 year old female with an underlying medical history of spinal stenosis, hypothyroidism, osteoarthritis, hypertension, depression, obesity, necrotizing fasciitis of her right leg, history of cellulitis of her jaw, who presents for FU consultation of her advanced, right sided predominant Parkinson's, complicated by hallucinations, anxiety, depression, insomnia and RBD, lower extremity swelling and overall deconditioning and frailty secondary to advancing age. Today we talked about her recent test results, including her recent EEG and her MRI and MRA from August. She has had more memory loss and more confusion as well as hallucinations. I talked to her daughter separately as well. I explained to her that we are certainly in a situation where does become challenging to manage advancing parkinsonian symptoms, side effects, sensitivity to medication and advanced age. I do commend her daughter for doing the best the family can to keep the patient safe and in an environment that is closest to home. Today I suggested we continue with Stalevo the same dose, Parcopa the  same dose, and increase her Lexapro to 10 mg daily. We will keep the Sinemet CR at bedtime the same as well. She is encouraged to drink more water and for constipation she can continue prune juice. She is advised to use her walker at all times. Unfortunately, she could not tolerate Ambien, amitriptyline, amantadine, clonazepam, and Seroquel in the past. I  will see her back in 3 months, sooner if the need arises. I answered all their questions today and the patient and her daughter were in agreement. Her daughter is encouraged to call or email me through My Chart should she have any additional questions or concerns in the interim.

## 2013-12-13 ENCOUNTER — Ambulatory Visit: Payer: Medicare Other | Admitting: Neurology

## 2013-12-22 ENCOUNTER — Telehealth: Payer: Self-pay | Admitting: Neurology

## 2013-12-22 MED ORDER — CARBIDOPA-LEVODOPA-ENTACAPONE 37.5-150-200 MG PO TABS: 1.0000 | ORAL_TABLET | Freq: Four times a day (QID) | ORAL | Status: DC

## 2013-12-22 NOTE — Telephone Encounter (Signed)
Rx has been sent.  I called back, got no answer.  Left message. 

## 2013-12-22 NOTE — Telephone Encounter (Signed)
Pt is waitng on mail order Rx to come in and she needs a 3 day supply called in to [facility] on [address] until the medication arrives. The medicaition is carbidopa-levodopa-entacapone (STALEVO) 37.5-150-200 MG per tablet. Please call and advise.

## 2014-01-03 ENCOUNTER — Other Ambulatory Visit: Payer: Self-pay | Admitting: *Deleted

## 2014-01-03 MED ORDER — NYSTATIN 100000 UNIT/GM EX POWD: CUTANEOUS | Status: DC

## 2014-01-03 MED ORDER — LEVOTHYROXINE SODIUM 25 MCG PO TABS: ORAL_TABLET | ORAL | Status: DC

## 2014-01-03 NOTE — Telephone Encounter (Signed)
[place]

## 2014-01-09 ENCOUNTER — Ambulatory Visit: Payer: Medicare Other | Admitting: Internal Medicine

## 2014-02-15 ENCOUNTER — Telehealth: Payer: Self-pay | Admitting: Neurology

## 2014-02-15 NOTE — Telephone Encounter (Signed)
Pt's daughter is calling stating she got a letter from [employer] stating that carbidopa-levodopa-entacapone (STALEVO) 37.5-150-200 MG per tablet is not covered and she only got a temporay supply.  She needs a letter or phone call to [facility] stating why she needs this medication.  The phone number [phone].

## 2014-02-15 NOTE — Telephone Encounter (Signed)
I have contacted ins and provided all required clinical info.  Request is currently under review.

## 2014-02-17 NOTE — Telephone Encounter (Signed)
[employer] is calling back stating that medication has been approved until 02/16/15.

## 2014-02-20 ENCOUNTER — Encounter: Payer: Self-pay | Admitting: Internal Medicine

## 2014-02-20 ENCOUNTER — Ambulatory Visit (INDEPENDENT_AMBULATORY_CARE_PROVIDER_SITE_OTHER): Payer: Medicare Other | Admitting: Internal Medicine

## 2014-02-20 VITALS — BP 158/64 | HR 57 | Temp 97.0°F | Resp 10 | Ht 65.0 in | Wt 192.0 lb

## 2014-02-20 DIAGNOSIS — E034 Atrophy of thyroid (acquired): Secondary | ICD-10-CM

## 2014-02-20 DIAGNOSIS — G20A1 Parkinson's disease without dyskinesia, without mention of fluctuations: Secondary | ICD-10-CM

## 2014-02-20 DIAGNOSIS — R739 Hyperglycemia, unspecified: Secondary | ICD-10-CM

## 2014-02-20 DIAGNOSIS — G20 Parkinson's disease: Secondary | ICD-10-CM

## 2014-02-20 DIAGNOSIS — K5901 Slow transit constipation: Secondary | ICD-10-CM

## 2014-02-20 DIAGNOSIS — I5032 Chronic diastolic (congestive) heart failure: Secondary | ICD-10-CM

## 2014-02-20 DIAGNOSIS — R634 Abnormal weight loss: Secondary | ICD-10-CM

## 2014-02-20 DIAGNOSIS — E038 Other specified hypothyroidism: Secondary | ICD-10-CM

## 2014-02-20 DIAGNOSIS — R5383 Other fatigue: Secondary | ICD-10-CM

## 2014-02-20 DIAGNOSIS — F0280 Dementia in other diseases classified elsewhere without behavioral disturbance: Secondary | ICD-10-CM

## 2014-02-20 MED ORDER — HYDRALAZINE HCL 25 MG PO TABS: 25.0000 mg | ORAL_TABLET | Freq: Three times a day (TID) | ORAL | Status: DC

## 2014-02-20 MED ORDER — ZOSTER VACCINE LIVE 19400 UNT/0.65ML ~~LOC~~ SOLR: 0.6500 mL | Freq: Once | SUBCUTANEOUS | Status: DC

## 2014-02-20 MED ORDER — TETANUS-DIPHTH-ACELL PERTUSSIS 5-2.5-18.5 LF-MCG/0.5 IM SUSP: 0.5000 mL | Freq: Once | INTRAMUSCULAR | Status: DC

## 2014-02-20 MED ORDER — METOPROLOL SUCCINATE ER 25 MG PO TB24: 50.0000 mg | ORAL_TABLET | Freq: Two times a day (BID) | ORAL | Status: DC

## 2014-02-20 NOTE — Progress Notes (Signed)
Patient ID: Andrea Dorsey, female   DOB: 01/03/1930, 81 y.o.   MRN: [id number]   Location:  [facility]  Code Status: DNR  Allergies  Allergen Reactions  . Ivp Dye [Iodinated Diagnostic Agents]     Only when intravenous, not on external skin.  . Clindamycin Rash    Unclear whether patient has an actual allergy to clindamycin. On beta-lactam at the same time.    Chief Complaint  Patient presents with  . Acute Visit    Weight loss and "Deep sleep spells"     HPI: Patient is a 81 y.o. female seen in the office today for an acute visit due to weight loss and "deep sleep spells" last of which occurred 2 days ago.    Has lost 20 lbs since 12/08/14.  Has not taken any diuretics in a few months so not fluid weight.  No change in her appetite.  Andrea Dorsey is eating normally.  BP mostly running 130s-150s, occasional 160s-170s, but returns to normal ,  Diastolic 35T to 61W, occasional 90s.    Does not use voltaren gel daily.  Also doesn't really use tramadol.  Andrea Dorsey doesn't talk about the pain as much.  Sometimes uses topical otc cream for knees instead.    Tolerating lexapro.    Over past couple of weeks, Andrea Dorsey's had increased deep sleep spells.  Andrea Dorsey'll be listless, not very responsive.  Lasted a couple of hours the last time.  Andrea Dorsey can't get up and Andrea Dorsey's like dead weight.  Eyes are set during it.    Stools are dark.  Taking a probiotic for several months.  Still has hard stools, but going daily.    Review of Systems:  Review of Systems  Constitutional: Positive for weight loss and malaise/fatigue.  Respiratory: Negative for shortness of breath.   Cardiovascular: Negative for chest pain and leg swelling.  Gastrointestinal: Positive for melena. Negative for abdominal pain, constipation and blood in stool.       Stools are hard  Genitourinary: Negative for dysuria.  Musculoskeletal: Positive for joint pain. Negative for falls.  Neurological: Positive for  dizziness, tremors, sensory change, focal weakness and weakness. Negative for loss of consciousness.  Endo/Heme/Allergies: Bruises/bleeds easily.  Psychiatric/Behavioral: Positive for depression and memory loss.    Past Medical History  Diagnosis Date  . Benign essential hypertension   . Hypothyroidism   . Osteoarthritis, generalized   . Parkinson disease   . Spinal stenosis   . History of necrotizing fasciitis     left leg, s/p debridement and graft  . Dementia in Parkinson's disease   . Depression     Past Surgical History  Procedure Laterality Date  . Skin debridement  2014    Brambhelt, MD  . Skin graft  2014    Brambhelt MD  . Spine surgery  2006    spinal stenosis  . Abdominal hysterectomy  1977    Social History:   reports that Andrea Dorsey has never smoked. Andrea Dorsey has never used smokeless tobacco. Andrea Dorsey reports that Andrea Dorsey does not drink alcohol or use illicit drugs.  Family History  Problem Relation Age of Onset  . Heart disease Mother   . Heart disease Sister   . Hypertension Sister   . Stroke Sister   . Heart disease Sister     heart attack  . Cancer Sister     colon    Medications: Patient's Medications  New Prescriptions   No medications on file  Previous Medications   ACETAMINOPHEN (TYLENOL) 500 MG TABLET    Take 500 mg by mouth every 6 (six) hours as needed for mild pain.    ASPIRIN EC 81 MG TABLET    Take 81 mg by mouth daily.   CARBIDOPA-LEVODOPA (PARCOPA) 25-100 MG PER DISINTEGRATING TABLET    Take 1 pill under the tongue up to twice daily.   CARBIDOPA-LEVODOPA (SINEMET CR) 50-200 MG PER TABLET    Take 1 tablet by mouth at bedtime.   CARBIDOPA-LEVODOPA-ENTACAPONE (STALEVO) 37.5-150-200 MG PER TABLET    Take 1 tablet by mouth 4 (four) times daily.   CHOLECALCIFEROL (VITAMIN D3) 2000 UNITS TABS    Take 2,000 Units by mouth daily.    DICLOFENAC SODIUM (VOLTAREN) 1 % GEL    Apply 4 g topically 2 (two) times daily. for knee pain   ESCITALOPRAM (LEXAPRO) 10 MG  TABLET    Take 1 tablet (10 mg total) by mouth daily.   FUROSEMIDE (LASIX) 20 MG TABLET    Take 20 mg by mouth. As needed or if weight gain is more than 3 lbs in one day   GUAIFENESIN (ROBITUSSIN) 100 MG/5ML SYRUP    Take 200 mg by mouth at bedtime as needed for cough.    HYDRALAZINE (APRESOLINE) 25 MG TABLET    TAKE 1 TABLET BY MOUTH 3 TIMES A DAY   LEVOTHYROXINE (SYNTHROID, LEVOTHROID) 25 MCG TABLET    Take one tablet by mouth 30 minutes before breakfast once daily for thyroid   MELATONIN 10 MG CAPS    Take 10 mg by mouth. Daily at night, around 8:00 pm   METOPROLOL SUCCINATE (TOPROL-XL) 25 MG 24 HR TABLET    Take 50 mg by mouth 2 (two) times daily.    NYSTATIN (MYCOSTATIN) POWDER    Apply to affected bilateral inguinal areas 2 times daily. Id # [id number]   OMEPRAZOLE (PRILOSEC) 20 MG CAPSULE    Take 20 mg by mouth daily.   OVER THE COUNTER MEDICATION    OTC stool softener prn   OVER THE COUNTER MEDICATION    PB8 Probiotic daily   POTASSIUM CHLORIDE SA (K-DUR,KLOR-CON) 20 MEQ TABLET    Take 20 mEq by mouth every three (3) days as needed (Only takes with fluid pill).    SENNA-DOCUSATE (SENOKOT-S) 8.6-50 MG PER TABLET    Take 2 tablets by mouth daily as needed for mild constipation.   TRAMADOL (ULTRAM) 50 MG TABLET    Take 50 mg by mouth every 8 (eight) hours as needed for moderate pain.   TRIAMCINOLONE (KENALOG) 0.025 % CREAM    Apply 1 application topically as needed (rash).   Modified Medications   Modified Medication Previous Medication   TDAP (BOOSTRIX) 5-2.5-18.5 LF-MCG/0.5 INJECTION Tdap (BOOSTRIX) 5-2.5-18.5 LF-MCG/0.5 injection      Inject 0.5 mLs into the muscle once.    Inject 0.5 mLs into the muscle once.   ZOSTER VACCINE LIVE, PF, (ZOSTAVAX) 37106 UNT/0.65ML INJECTION zoster vaccine live, PF, (ZOSTAVAX) 26948 UNT/0.65ML injection      Inject 19,400 Units into the skin once.    Inject 0.65 mLs into the skin once.  Discontinued Medications   No medications on file     Physical  Exam: Filed Vitals:   02/20/14 1101  BP: 158/64  Pulse: 57  Temp: 97 F (36.1 C)  TempSrc: Oral  Resp: 10  Height: 5\' 5"  (1.651 m)  Weight: 192 lb (87.091 kg)  SpO2: 97%  Physical Exam  Constitutional: Andrea Dorsey appears  well-developed and well-nourished. No distress.  HENT:  Head: Normocephalic and atraumatic.  Cardiovascular: Normal heart sounds and intact distal pulses.   irreg irreg  Pulmonary/Chest: Effort normal and breath sounds normal. Andrea Dorsey has no wheezes. Andrea Dorsey has no rales.  Abdominal: Soft. Bowel sounds are normal. Andrea Dorsey exhibits no distension and no mass. There is no tenderness. There is no rebound and no guarding.  Genitourinary:  Sent with guaiac test  Musculoskeletal:  cogwheeling rigidity of arms  Neurological: Andrea Dorsey is alert.  Skin: Skin is warm and dry.  Psychiatric:  Flat affect    Labs reviewed: Basic Metabolic Panel:  Recent Labs  03/30/13 1631  07/14/13 1648  09/01/13 0530 09/03/13 0550 10/27/13 0933  NA 143  < > 144  < > 144 142 142  K 5.3*  < > 5.1  < > 4.3 4.4 4.1  CL 103  < > 103  < > 109 107 103  CO2 25  < > 23  < > 23 24 25   GLUCOSE 97  < > 106*  < > 105* 104* 126*  BUN 21  < > 21  < > 18 20 20   CREATININE 1.23*  < > 1.32*  < > 1.07 1.05 1.24*  CALCIUM 10.4*  < > 10.3  < > 9.4 9.8 10.0  TSH 2.650  --  4.060  --   --   --  3.330  < > = values in this interval not displayed. Liver Function Tests:  Recent Labs  03/15/13 1524  09/01/13 0530 09/03/13 0550 10/27/13 0933  AST 16  < > 17 16 41*  ALT <5  < > <5 <5 13  ALKPHOS 93  < > 89 81 86  BILITOT 0.8  < > 1.0 1.0 1.2  PROT 7.0  < > 6.1 6.0 6.0  ALBUMIN 3.5  --  3.2* 3.2*  --   < > = values in this interval not displayed. No results for input(s): LIPASE, AMYLASE in the last 8760 hours. No results for input(s): AMMONIA in the last 8760 hours. CBC:  Recent Labs  07/14/13 1648  09/01/13 0530 09/03/13 0550 10/27/13 0933  WBC 5.9  < > 5.6 5.1 7.5  NEUTROABS 3.1  --  3.0  --  5.6  HGB  14.9  < > 14.9 14.4 16.2*  HCT 45.0  < > 44.4 43.6 48.3*  MCV 87  < > 85.7 85.7 88  PLT 218  < > 202 172 171  < > = values in this interval not displayed. Lipid Panel: No results for input(s): CHOL, HDL, LDLCALC, TRIG, CHOLHDL, LDLDIRECT in the last 8760 hours. Lab Results  Component Value Date   HGBA1C 6.0* 10/27/2013    Assessment/Plan 1. Parkinson disease -cont sinemet, parcopa and stalevo as per [name] -encouraged walking in the home with caregiver -mood recently improved with lexapro use  2. Loss of weight - intake remains very good so cause not entirely clear, Andrea Dorsey is not very active and tremors are not severe so these do not explain wt loss -check hemoccult test for blood since her caregiver says her stools have been dark even before the probiotic was started - Fecal occult blood, imunochemical  3. Slow transit constipation -cont senna s, probiotic and balanced diet with adequate water intake   4. Dementia in Parkinson's disease -no recent changes with this, has caregiver who is helping her daughter care for her and is very attentive  5. Chronic diastolic CHF (congestive heart failure) -  cont current regimen of metoprolol, hydralazine, not using lasix (prn), baby asa - metoprolol succinate (TOPROL-XL) 25 MG 24 hr tablet; Take 2 tablets (50 mg total) by mouth 2 (two) times daily.  Dispense: 360 tablet; Refill: 3 - hydrALAZINE (APRESOLINE) 25 MG tablet; Take 1 tablet (25 mg total) by mouth 3 (three) times daily.  Dispense: 90 tablet; Refill: 3 - Basic metabolic panel  6. Hypothyroidism due to acquired atrophy of thyroid -cont 104mcg synthroid first thing in am on empty stomach separate form other meds  7. Lethargy -suspect this is all parkinson's related -? OSA causing it--Andrea Dorsey would not tolerate a cpap if Andrea Dorsey did need one -do not sound like typical seizures and when Andrea Dorsey was hospitalized and worked up for this, EEG was negative  8. Hyperglycemia - f/u labs, not on  diabetes meds, monitor - Hemoglobin A1c  Labs/tests ordered:   Orders Placed This Encounter  Procedures  . Fecal occult blood, imunochemical  . Basic metabolic panel  . Hemoglobin A1c    Next appt: 3 mos for med mgt  Onyx Schirmer L. Babara Buffalo, D.O. [facility] [address], [place] [address] Cell Phone (Mon-Fri 8am-5pm):  [phone] On Call:  [phone] & follow prompts after 5pm & weekends Office Phone:  [phone] Office Fax:  [phone]

## 2014-02-20 NOTE — Patient Instructions (Signed)
Check glucose when she has a lethargic spell to make sure it is not low (<80).

## 2014-02-21 LAB — BASIC METABOLIC PANEL
BUN/Creatinine Ratio: 14 (ref 11–26)
BUN: 16 mg/dL (ref 8–27)
CO2: 24 mmol/L (ref 18–29)
Calcium: 10.4 mg/dL — ABNORMAL HIGH (ref 8.7–10.3)
Chloride: 107 mmol/L (ref 97–108)
Creatinine, Ser: 1.13 mg/dL — ABNORMAL HIGH (ref 0.57–1.00)
GFR calc Af Amer: 52 mL/min/{1.73_m2} — ABNORMAL LOW (ref >59)
GFR calc non Af Amer: 45 mL/min/{1.73_m2} — ABNORMAL LOW (ref >59)
Glucose: 95 mg/dL (ref 65–99)
Potassium: 5.2 mmol/L (ref 3.5–5.2)
Sodium: 144 mmol/L (ref 134–144)

## 2014-02-21 LAB — HEMOGLOBIN A1C
Est. average glucose Bld gHb Est-mCnc: 146 mg/dL
Hgb A1c MFr Bld: 6.7 % — ABNORMAL HIGH (ref 4.8–5.6)

## 2014-03-06 ENCOUNTER — Other Ambulatory Visit: Payer: Medicare Other

## 2014-03-06 DIAGNOSIS — Z1211 Encounter for screening for malignant neoplasm of colon: Secondary | ICD-10-CM

## 2014-03-07 ENCOUNTER — Telehealth: Payer: Self-pay | Admitting: Neurology

## 2014-03-07 NOTE — Telephone Encounter (Signed)
Called and rescheduled appointment, confirmed with daughter.

## 2014-03-08 LAB — FECAL OCCULT BLOOD, IMMUNOCHEMICAL: Fecal Occult Bld: NEGATIVE

## 2014-03-09 ENCOUNTER — Ambulatory Visit: Payer: Medicare Other | Admitting: Neurology

## 2014-03-10 ENCOUNTER — Ambulatory Visit: Payer: Medicare Other | Admitting: Neurology

## 2014-03-10 ENCOUNTER — Telehealth: Payer: Self-pay | Admitting: Neurology

## 2014-03-10 MED ORDER — CARBIDOPA-LEVODOPA-ENTACAPONE 37.5-150-200 MG PO TABS: 1.0000 | ORAL_TABLET | Freq: Four times a day (QID) | ORAL | Status: DC

## 2014-03-10 NOTE — Telephone Encounter (Signed)
Pt's daughter is calling stating she needs a temporay dose of carbidopa-levodopa-entacapone (STALEVO) 37.5-150-200 MG per tablet until her mail order comes in.  She wants to know if she can get this today. She uses [facility] on [address] in [place].  Please call and advise.

## 2014-03-10 NOTE — Telephone Encounter (Signed)
Rx has been sent.  I called back.  Got no answer.  Left message.  

## 2014-03-16 ENCOUNTER — Encounter: Payer: Self-pay | Admitting: Neurology

## 2014-03-16 ENCOUNTER — Ambulatory Visit (INDEPENDENT_AMBULATORY_CARE_PROVIDER_SITE_OTHER): Payer: Medicare Other | Admitting: Neurology

## 2014-03-16 VITALS — BP 175/78 | HR 68 | Temp 97.7°F | Ht <= 58 in | Wt 198.0 lb

## 2014-03-16 DIAGNOSIS — G20 Parkinson's disease: Secondary | ICD-10-CM

## 2014-03-16 DIAGNOSIS — F419 Anxiety disorder, unspecified: Secondary | ICD-10-CM

## 2014-03-16 DIAGNOSIS — G4700 Insomnia, unspecified: Secondary | ICD-10-CM

## 2014-03-16 DIAGNOSIS — F32A Depression, unspecified: Secondary | ICD-10-CM

## 2014-03-16 DIAGNOSIS — R413 Other amnesia: Secondary | ICD-10-CM

## 2014-03-16 DIAGNOSIS — G4752 REM sleep behavior disorder: Secondary | ICD-10-CM

## 2014-03-16 DIAGNOSIS — F329 Major depressive disorder, single episode, unspecified: Secondary | ICD-10-CM

## 2014-03-16 DIAGNOSIS — R6889 Other general symptoms and signs: Secondary | ICD-10-CM

## 2014-03-16 MED ORDER — CARBIDOPA-LEVODOPA ER 50-200 MG PO TBCR: 1.0000 | EXTENDED_RELEASE_TABLET | Freq: Every day | ORAL | Status: DC

## 2014-03-16 MED ORDER — CARBIDOPA-LEVODOPA-ENTACAPONE 37.5-150-200 MG PO TABS: 1.0000 | ORAL_TABLET | Freq: Four times a day (QID) | ORAL | Status: DC

## 2014-03-16 MED ORDER — BUSPIRONE HCL 15 MG PO TABS: ORAL_TABLET | ORAL | Status: DC

## 2014-03-16 MED ORDER — ESCITALOPRAM OXALATE 10 MG PO TABS: 10.0000 mg | ORAL_TABLET | Freq: Every day | ORAL | Status: DC

## 2014-03-16 NOTE — Progress Notes (Signed)
Subjective:    Patient ID: Andrea Dorsey is a 81 y.o. female.  HPI     Interim history:   Andrea Dorsey is a very pleasant 81 year old right-handed woman with an underlying complex medical history of spinal stenosis, necrotizing fasciitis, status post debridement and grafting on the right lower leg, lower extremity edema, insomnia, hypothyroidism, osteoarthritis, hypertension, depression, and obesity, who presents for followup consultation of her advanced, right-sided predominant Parkinson's disease, complicated by hallucinations, memory loss, mood disorder including anxiety and depression, sleep disorder including insomnia, OSA and RBD. She is accompanied by her daughter and a caretaker again today. I last saw her on 12/07/2013, at which time I talked to the patient's daughter, Andrea Dorsey, separately before seeing the patient. Her daughter was concerned about the patient's general decline. She had more dream enactments. The patient felt that she was getting worse and that she could not sleep at night. She was dozing off during the day. She had not fallen thankfully. She could not tolerate clonazepam. She also had side effects on low-dose Seroquel. Melatonin at 10 mg at night did not seem to help. She was not drinking enough water. She had moved to a new home and had 3 caretakers taking turns and she was not without supervision. On the weekends her family will take turns supervising and helping. I increased her Lexapro and kept her other medications the same.    Today, her daughter reports that she continues to have spells of decreased alertness. She does not fully lose consciousness, she does not have convulsions, she seems to be listless and limp but sometimes her eyes forcefully closed and she won't open them. Her mouth is closed and she won't open it. They cannot get her to speak. This may last up to 2 hours and then subsides. They have taken her blood pressure at those times and it is high typically.  She does not bite her cheeks or soil herself or wet herself typically except for one time when she didn't wet herself. There is no time predilection. There does not seem to be a trigger. Her caretaker adds that sometimes these happen more often first thing in the morning and especially after she has had a bad dream that night perhaps. No other overt triggers are noted. The patient herself does not add additional information to this. Motor-wise she has remained stable. The increase in Lexapro to 10 mg has not made a big difference per daughter. She no longer is on melatonin.  I saw her on 09/07/13, at which time her daughter reported no recent staring or zoning out spells. She was in the process of moving to a townhome with a caretaker for 4 days and 3 nights and family staying with her the rest of the time. I ordered an EEG. I also asked her to take Sinemet CR at night to get her through the night a little bit better. I started her on a low-dose Lexapro 5 mg strength. Her EEG on 09/22/2013 was reported as normal in the awake state. Her daughter requested a sooner appointment for more hallucinations and confusion. She had more memory loss.   I saw her on 06/08/2013, at which time I suggested she continue with Stalevo 150 4 times a day. She previously has had hallucinations with increased doses. I suggested adding a little extra levodopa in the form of Parcopa half a pill up to twice daily as needed. This was supposed to help with freezing. In the interim, about a month later  on 07/13/2013 she was seen by Andrea Holler, NP for a sooner than scheduled appointment at which time the Parcopa dose was increased to one whole pill up to twice daily for rigidity and freezing. She presented to the emergency room on 07/15/2013 with generalized complaint of weakness and freezing and was advised to increase her Parcopa. She presented voice recently this month to the emergency room with altered sensorium, or staring spells. Workup  with head CT and MRIs were negative. It was felt that these were related to Parkinson's disease. She had a brain MRI and MRA without contrast on 09/03/2013:No acute intracranial abnormality. 2. Cerebral atrophy and single remote microhemorrhage in the left frontal lobe. 3. Unremarkable head MRA. In addition, have reviewed the images through the PACS system. She had head CT without contrast on 09/03/2013:No acute intracranial pathology seen on CT. 2. Inspissated mucus filling the left maxillary sinus, and mild partial opacification of the mastoid air cells bilaterally. She had head CT without contrast on 09/01/2013: No acute intracranial pathology seen on CT. 2. Mild cortical volume loss noted. 3. Mild partial opacification of the mastoid air cells bilaterally. In addition, reviewed the images through the PACS system.  I saw her on 02/03/13, at which time I increased her Stalevo to 5 times a day. We talked about potential side effects. I asked her to stop the Seroquel and started her on low-dose clonazepam for insomnia and RBD. In the interim she was seen by our nurse practitioner, Ms. Lam on 04/08/2013, at which time I also saw her, and we mutually decided to decrease Stalevo back to 4 times a day because of worsening confusion and hallucinations. Her clonazepam was discontinued by her PCP because of side effects. I suggested a trial of melatonin, 5-10 mg. We checked some labs including CBC, CMP, CRP, ESR and urinalysis. Labs showed no significant abnormalities, mild but stable kidney impairment was noted. She was encouraged to drink more water. She has seen her PCP in April 2015 and was advised to take her fluid pill as needed, an increase of melatonin to 10 mg.   I first met her on 10/15/2012, at which time a continued her Stalevo. I suggested a small dose of Seroquel to help her sleep and tone down the REM behavior disorder and encouraged him to discuss with her primary care physician the addition of an  antidepressant. She presents with a complaint of worsening tremors.   She has been in [facility] [address] since 10/18/12. She has a Hx of vivid dreams and tends to act out in her sleep.   She previously used to see a neurologist at [facility], when she lived in [place], [place]. She was diagnosed with PD about 12 years ago when she was still residing in [place]. She needs assistance with her ADLs. She has been living with her daughter and son-in-law and they have looked into the possibility of a long-term care facility but the patient has been resistant. She has had problems at night including sundowning, confusion, inability to sleep.   Her symptoms started on one side with tremors, but the patient was not sure which side. She has been on Stalevo for the past 2 years, and prior to that she was on C/L, and prior to that she was on Amantadine. She may not have tried a dopamine agonist or rasagiline in the past. She has been experiencing nausea with her PD medications and still has occasional nausea. In March 2014  she developed necrotizing fasciitis and needed debridement and grafting. She developed hallucinations at the time, but was on pain medications at the time, but the [facility] persisted beyond that. She also started having memory loss then. She developed cellulitis with complications in her jaw and needed all remaining teeth removed and had IV antibiotics in mid-2014. She has no FHx of PD or dementia. She has no Hx of psychiatric premorbid illness. In 2006 she had back surgery. She has no exposure to chemicals, or agent orange. She has been an anxious person. She is not able to sleep at night. She was tried on Ambien and amitriptyline. She has difficulty with sleep onset and sleep maintenance.   She has occasional urinary incontinence, occasional constipation. She snores, and needed to have a sleep study, but did not go. She has had some dream enactments and has slid out of bed.   She had been  very opposed to going into assisted living, but understood that she given her complex medical history and multiple issues and advanced Parkinson's disease she was no longer safe to live by herself.     Her Past Medical History Is Significant For: Past Medical History  Diagnosis Date  . Benign essential hypertension   . Hypothyroidism   . Osteoarthritis, generalized   . Parkinson disease   . Spinal stenosis   . History of necrotizing fasciitis     left leg, s/p debridement and graft  . Dementia in Parkinson's disease   . Depression     Her Past Surgical History Is Significant For: Past Surgical History  Procedure Laterality Date  . Skin debridement  2014    Brambhelt, MD  . Skin graft  2014    Brambhelt MD  . Spine surgery  2006    spinal stenosis  . Abdominal hysterectomy  1977    Her Family History Is Significant For: Family History  Problem Relation Age of Onset  . Heart disease Mother   . Heart disease Sister   . Hypertension Sister   . Stroke Sister   . Heart disease Sister     heart attack  . Cancer Sister     colon    Her Social History Is Significant For: History   Social History  . Marital Status: Widowed    Spouse Name: N/A  . Number of Children: 2  . Years of Education: 12   Occupational History  .      retired    Social History Main Topics  . Smoking status: Never Smoker   . Smokeless tobacco: Never Used  . Alcohol Use: No  . Drug Use: No  . Sexual Activity: Not Currently   Other Topics Concern  . None   Social History Narrative   Patient lives in assisted living.patient is right handed    Her Allergies Are:  Allergies  Allergen Reactions  . Ivp Dye [Iodinated Diagnostic Agents]     Only when intravenous, not on external skin.  . Clindamycin Rash    Unclear whether patient has an actual allergy to clindamycin. On beta-lactam at the same time.  :   Her Current Medications Are:  Outpatient Encounter Prescriptions as of 03/16/2014   Medication Sig  . acetaminophen (TYLENOL) 500 MG tablet Take 500 mg by mouth every 6 (six) hours as needed for mild pain.   Marland Kitchen aspirin EC 81 MG tablet Take 81 mg by mouth daily.  . carbidopa-levodopa (PARCOPA) 25-100 MG per disintegrating tablet Take 1 pill under the  tongue up to twice daily. (Patient taking differently: Taking as needed,only when freezing)  . carbidopa-levodopa (SINEMET CR) 50-200 MG per tablet Take 1 tablet by mouth at bedtime.  . carbidopa-levodopa-entacapone (STALEVO) 37.5-150-200 MG per tablet Take 1 tablet by mouth 4 (four) times daily.  . Cholecalciferol (VITAMIN D3) 2000 UNITS TABS Take 2,000 Units by mouth daily.   Marland Kitchen escitalopram (LEXAPRO) 10 MG tablet Take 1 tablet (10 mg total) by mouth daily.  . furosemide (LASIX) 20 MG tablet Take 20 mg by mouth. As needed or if weight gain is more than 3 lbs in one day  . guaifenesin (ROBITUSSIN) 100 MG/5ML syrup Take 200 mg by mouth at bedtime as needed for cough.   . hydrALAZINE (APRESOLINE) 25 MG tablet Take 1 tablet (25 mg total) by mouth 3 (three) times daily.  Marland Kitchen levothyroxine (SYNTHROID, LEVOTHROID) 25 MCG tablet Take one tablet by mouth 30 minutes before breakfast once daily for thyroid  . metoprolol succinate (TOPROL-XL) 25 MG 24 hr tablet Take 2 tablets (50 mg total) by mouth 2 (two) times daily.  Marland Kitchen nystatin (MYCOSTATIN) powder Apply to affected bilateral inguinal areas 2 times daily. Id # [id number]  . OVER THE COUNTER MEDICATION OTC stool softener prn  . OVER THE COUNTER MEDICATION PB8 Probiotic daily  . potassium chloride SA (K-DUR,KLOR-CON) 20 MEQ tablet Take 20 mEq by mouth every three (3) days as needed (Only takes with fluid pill).   . senna-docusate (SENOKOT-S) 8.6-50 MG per tablet Take 2 tablets by mouth daily as needed for mild constipation.  . Tdap (BOOSTRIX) 5-2.5-18.5 LF-MCG/0.5 injection Inject 0.5 mLs into the muscle once.  . traMADol (ULTRAM) 50 MG tablet Take 50 mg by mouth every 8 (eight) hours as needed  for moderate pain.  Marland Kitchen triamcinolone (KENALOG) 0.025 % cream Apply 1 application topically as needed (rash).   . zoster vaccine live, PF, (ZOSTAVAX) 09811 UNT/0.65ML injection Inject 19,400 Units into the skin once.  . [DISCONTINUED] diclofenac sodium (VOLTAREN) 1 % GEL Apply 4 g topically 2 (two) times daily. for knee pain  . [DISCONTINUED] Melatonin 10 MG CAPS Take 10 mg by mouth. Daily at night, around 8:00 pm  . [DISCONTINUED] omeprazole (PRILOSEC) 20 MG capsule Take 20 mg by mouth daily.  :  Review of Systems:  Out of a complete 14 point review of systems, all are reviewed and negative with the exception of these symptoms as listed below:   Review of Systems  Neurological:       Goes into a zone, limp limbs, last 2-3 hours, aware of surrounding but not talking. Increased B/ P at the time.    Objective:  Neurologic Exam  Physical Exam Physical Examination:   Filed Vitals:   03/16/14 0934  BP: 175/78  Pulse: 68  Temp: 97.7 F (36.5 C)    General Examination: The patient is a very pleasant 81 y.o. female in no acute distress. She is obese. She appears frail and deconditioned. She is able to provide very little of her own her history today. She is very quiet, but responsive and able to give appropriate answers.   HEENT: Normocephalic, atraumatic, pupils are equal, round and reactive to light and accommodation. Extraocular tracking shows moderate saccadic breakdown without nystagmus noted. There is limitation to upper gaze. There is mild decrease in eye blink rate. Hearing is impaired mildly. Funduscopic exam is normal, but there is evidence of cataracts bilaterally. Face is symmetric with moderate facial masking and normal facial sensation. There is a mild  lower lip and jaw tremor. Neck is moderately rigid with intact passive ROM. There are no carotid bruits on auscultation. Oropharynx exam reveals moderate mouth dryness. There is significant airway crowding noted d/t redundant soft  palate and large tongue. She is edentulous. Mallampati is class III. Tongue protrudes centrally and palate elevates symmetrically. There is mild drooling.   Chest: is clear to auscultation without wheezing, rhonchi or crackles noted.  Heart: sounds are regular and normal without murmurs, rubs or gallops noted.   Abdomen: is soft, non-tender and non-distended with normal bowel sounds appreciated on auscultation.  Extremities: There is 2+ pitting edema in the distal lower extremities bilaterally. Pedal pulses are intact. Chronic stasis-like changes are noted in the distal legs bilaterally with s/p skin grafting on the right. There are no varicose veins - all unchanged from last time.   Skin: is warm and dry with no trophic changes noted. Age-related changes are noted on the skin.   Musculoskeletal: exam reveals no obvious joint deformities, tenderness, joint swelling or erythema.  Neurologically:  Mental status: The patient is awake and alert, paying fairly good attention. She is able to partially provide the history. Her daughter and her caretaker provide most of the history. She is oriented to: person, place, time/date and situation. Her memory, attention, language and knowledge are impaired mildly. There is no aphasia, agnosia, apraxia or anomia. There is a moderate degree of bradyphrenia. Speech is moderately to severely hypophonic with mild dysarthria noted, however speech is edentulous as well. Mood is congruent and affect is anxious.    12/07/2013: MMSE 17/30, CDT: 3/4, AFT: 5/min  Cranial nerves are as described above under HEENT exam. In addition, shoulder shrug is normal with equal shoulder height noted.  Motor exam: Normal bulk, and strength for age is noted. There are no dyskinesias noted.  Tone is mildly rigid with presence of cogwheeling in the right>left upper extremity and in both LEs. There is overall moderate bradykinesia. There is no drift or rebound.  There is an intermittent  mild to moderate UE resting tremor on the right and a slight intermittent resting tremor in the left upper extremity, not much changed.  Romberg is not tested.   Reflexes are 1+ in the upper extremities and trace in the knees and absent in the ankle.   Fine motor skills exam: Finger taps are moderately impaired on the right and mild to moderately impaired on the left. Hand movements are moderately impaired on the right and mild to moderately impaired on the left. RAP (rapid alternating patting) is moderately impaired on the right and mildly impaired on the left. Foot taps are moderate to severely impaired on the right and moderately impaired on the left. Foot agility (in the form of heel stomping) is moderately impaired on the right and moderately impaired on the left.    Cerebellar testing shows no dysmetria or intention tremor on finger to nose testing. Heel to shin is not possible for her. There is no truncal or gait ataxia.   Sensory exam is intact to light touch, pinprick, vibration, temperature sense in the upper and lower extremities.   Gait, station and balance: She stands up from the seated position with significant difficulty and needs to push herself up with her hands and also requires some assistance. She leans slightly to the R. Posture is moderately stooped. Stance is wide-based. She walks with significant decrease in stride length and pace and has to rely on her rolling walker which she brought in  today, but she again maneuvers it fairly well after some start hesitation. Tandem walk is not possible. Balance is moderately impaired. She is not able to do a toe or heel stance. She has mild trouble turning and mild hesitation with turns, but no overt freezing spell.    Assessment and Plan:   In summary, Maila Dukes is a very pleasant 81 year old female with an underlying medical history of spinal stenosis, hypothyroidism, osteoarthritis, hypertension, depression, obesity, necrotizing  fasciitis of her right leg, history of cellulitis of her jaw, who presents for FU consultation of her advanced, right sided predominant Parkinson's, complicated by hallucinations, anxiety, depression, insomnia and RBD, lower extremity swelling and overall deconditioning and frailty secondary to advancing age. Today we again talked about her recent test results, including her recent EEG and her MRI and MRA from August. She has had more memory loss and more confusion as well as hallucinations. I again explained to them that we are in a situation where it becomes challenging to manage advancing parkinsonian symptoms, side effects, sensitivity to medication and advanced age related issues. I'm not sure what to make of these spells. They are not very frequent. But she may have them 3 days in a row and then not again for days. I wonder if these are more behavioral and anxiety or stress related than truly neurological or Parkinson's related per se. Nevertheless, we've done EEG less than 6 months ago we can do another one and I actually ordered 1 but her daughter is hesitant. I suggested that we keep the Lexapro at 10 mg and add BuSpar at low dose 7.5 mg twice daily to see if we can preempt these spells or break the cycle. The daughter is hesitant to try yet another medication and I did explain to her that we do not exactly know what the spells are at this point. We can certainly also do a watchful waiting approach but if she does have frequent spells we should probably proceed with a EEG and then if this is again noted to be normal try the BuSpar. I suggested we continue with Stalevo at the same dose,  she no longer is on Parcopa, as the daughter felt it did not and anything, and she will continue with Lexapro 10 mg daily. We will keep the Sinemet CR at bedtime the same as well. She is encouraged to drink more water and for constipation she can continue prune juice. She is advised to use her walker at all times.  Unfortunately, she could not tolerate Ambien, amitriptyline, amantadine, clonazepam, and Seroquel in the past. I will see her back in 3 months, sooner if the need arises. I answered all their questions today and the patient and her daughter and caretakerwere in agreement. Her daughter is encouraged to call or email me through My Chart should she have any additional questions or concerns in the interim.  I spent 40 minutes in total face-to-face time with the patient, more than 50% of which was spent in counseling and coordination of care, reviewing test results, reviewing medication and discussing or reviewing the diagnosis of PD, its prognosis and treatment options.

## 2014-03-16 NOTE — Patient Instructions (Addendum)
We will continue with the Stalevo.  We will continue with the Lexapro.  We will try Buspar for anxiety: Buspar (generic name: buspirone) 15 mg, take 1/2 twice daily. Side effects may include sleepiness, drowsiness, confusion, headaches. This is usually a well tolerated medication and with low interaction risk with other medications. We are starting at a low dose.  I will order another brain electrical test, called EEG.

## 2014-04-08 ENCOUNTER — Other Ambulatory Visit: Payer: Self-pay | Admitting: Neurology

## 2014-04-13 ENCOUNTER — Ambulatory Visit: Payer: Medicare Other | Admitting: Neurology

## 2014-04-13 ENCOUNTER — Ambulatory Visit: Payer: Self-pay | Admitting: Neurology

## 2014-05-15 ENCOUNTER — Encounter: Payer: Self-pay | Admitting: Internal Medicine

## 2014-05-19 ENCOUNTER — Other Ambulatory Visit: Payer: Self-pay

## 2014-05-19 DIAGNOSIS — F32A Depression, unspecified: Secondary | ICD-10-CM

## 2014-05-19 DIAGNOSIS — F419 Anxiety disorder, unspecified: Secondary | ICD-10-CM

## 2014-05-19 DIAGNOSIS — F329 Major depressive disorder, single episode, unspecified: Secondary | ICD-10-CM

## 2014-05-19 DIAGNOSIS — G20 Parkinson's disease: Secondary | ICD-10-CM

## 2014-05-19 DIAGNOSIS — R6889 Other general symptoms and signs: Secondary | ICD-10-CM

## 2014-05-19 MED ORDER — ESCITALOPRAM OXALATE 10 MG PO TABS: 10.0000 mg | ORAL_TABLET | Freq: Every day | ORAL | Status: DC

## 2014-05-19 NOTE — Telephone Encounter (Signed)
Pharmacy requests 90 day Rx  

## 2014-05-22 ENCOUNTER — Ambulatory Visit: Payer: Medicare Other | Admitting: Internal Medicine

## 2014-05-31 ENCOUNTER — Other Ambulatory Visit: Payer: Self-pay

## 2014-05-31 DIAGNOSIS — I5032 Chronic diastolic (congestive) heart failure: Secondary | ICD-10-CM

## 2014-05-31 MED ORDER — METOPROLOL SUCCINATE ER 25 MG PO TB24: 50.0000 mg | ORAL_TABLET | Freq: Two times a day (BID) | ORAL | Status: DC

## 2014-05-31 NOTE — Telephone Encounter (Signed)
Patients daughter called requesting rx for metoprolol to be sent to local pharmacy while patient is awaiting mail order rx

## 2014-06-05 ENCOUNTER — Ambulatory Visit: Payer: Medicare Other | Admitting: Internal Medicine

## 2014-06-13 ENCOUNTER — Telehealth: Payer: Self-pay | Admitting: Neurology

## 2014-06-13 NOTE — Telephone Encounter (Signed)
Pt's daughter called and requested a refill on Rx. escitalopram (LEXAPRO) 10 MG tablet be sent to [id number] - [place]. Please call and advise.

## 2014-06-13 NOTE — Telephone Encounter (Signed)
This Rx was already sent to [facility] for [facility] 6 [facility] supply on 04/29.  I called the pharmacy.  Spoke with Minette Brine.  She said they have the Rx saved on patient's file, and will fill it today.  I called the patient back.  Got no answer.  Left message.

## 2014-06-28 ENCOUNTER — Ambulatory Visit (INDEPENDENT_AMBULATORY_CARE_PROVIDER_SITE_OTHER): Payer: Medicare Other | Admitting: Neurology

## 2014-06-28 ENCOUNTER — Encounter: Payer: Self-pay | Admitting: Neurology

## 2014-06-28 VITALS — BP 142/88 | HR 60 | Resp 16 | Ht 65.0 in | Wt 207.0 lb

## 2014-06-28 DIAGNOSIS — F419 Anxiety disorder, unspecified: Secondary | ICD-10-CM | POA: Diagnosis not present

## 2014-06-28 DIAGNOSIS — R413 Other amnesia: Secondary | ICD-10-CM

## 2014-06-28 DIAGNOSIS — G20 Parkinson's disease: Secondary | ICD-10-CM

## 2014-06-28 NOTE — Progress Notes (Signed)
Subjective:    Patient ID: Andrea Dorsey is a 81 y.o. female.  HPI     Interim history:   Andrea Dorsey is a very pleasant 81 year old right-handed woman with an underlying complex medical history of spinal stenosis, necrotizing fasciitis, status post debridement and grafting on the right lower leg, lower extremity edema, insomnia, hypothyroidism, osteoarthritis, hypertension, depression, and obesity, who presents for followup consultation of her advanced, right-sided predominant Parkinson's disease, complicated by hallucinations, memory loss, mood disorder including anxiety and depression, sleep disorder including insomnia, OSA and RBD. She is accompanied by her daughter and her caretaker again today. I last saw her on 03/16/2014, at which time her daughter reported ongoing issues with spells of decreased alertness. She would not lose full consciousness but became listless and limp. She would not follow commands. The duration would be minutes to maybe 2 hours. Checking blood pressure at this times showed that she had some high blood pressure at the time. There was no convulsion, no tongue bite, no full loss of consciousness, no loss of bowel or bladder control except for one time when she did wet herself. There was no time predilection. I suggested we continue with Lexapro at 10 mg and add low-dose BuSpar for anxiety. We have done workup in the form of brain scans and EEG in the recent past. She was no longer on Parcopa as it did not help. She was not sleeping well. We kept the Sinemet CR the same at night.  Today, 06/28/2014: Her daughter reports that things are fairly stable at this point. The patient complains of cough at night. She takes daily cough medicine. She has postnasal drip and also drooling at night. Her speech is soft. She is walking with a walker. Thankfully she has not fallen recently. Appetite is good. She has trouble standing up from the seated position. The daughter had purchased a  lift chair but they lost the remote to it. She has a tendency to stagger backwards when trying to stand up. She does not walk without her rolling walker. She continues to be on Stalevo 1 pill 4 times a day, and for hourly intervals. She takes the Sinemet CR at night. They never tried the BuSpar as the anxiety episodes or spells subsided. Her daughter suspects that this was in the context of a caretaker that caused stress to the patient. Things have improved in that regard. She does take Lexapro 10 mg daily. Andrea Dorsey is requesting FMLA paperwork so she can be here for appointments and also in case she has to go home for emergency situations.  Previously:   I saw her on 12/07/2013, at which time I talked to the patient's daughter, Andrea Dorsey, separately before seeing the patient. Her daughter was concerned about the patient's general decline. She had more dream enactments. The patient felt that she was getting worse and that she could not sleep at night. She was dozing off during the day. She had not fallen thankfully. She could not tolerate clonazepam. She also had side effects on low-dose Seroquel. Melatonin at 10 mg at night did not seem to help. She was not drinking enough water. She had moved to a new home and had 3 caretakers taking turns and she was not without supervision. On the weekends her family will take turns supervising and helping. I increased her Lexapro and kept her other medications the same.     I saw her on 09/07/13, at which time her daughter reported no recent staring or zoning out  spells. She was in the process of moving to a townhome with a caretaker for 4 days and 3 nights and family staying with her the rest of the time. I ordered an EEG. I also asked her to take Sinemet CR at night to get her through the night a little bit better. I started her on a low-dose Lexapro 5 mg strength. Her EEG on 09/22/2013 was reported as normal in the awake state. Her daughter requested a sooner appointment for  more hallucinations and confusion. She had more memory loss.   I saw her on 06/08/2013, at which time I suggested she continue with Stalevo 150 4 times a day. She previously has had hallucinations with increased doses. I suggested adding a little extra levodopa in the form of Parcopa half a pill up to twice daily as needed. This was supposed to help with freezing. In the interim, about a month later on 07/13/2013 she was seen by Andrea Holler, NP for a sooner than scheduled appointment at which time the Parcopa dose was increased to one whole pill up to twice daily for rigidity and freezing. She presented to the emergency room on 07/15/2013 with generalized complaint of weakness and freezing and was advised to increase her Parcopa. She presented voice recently this month to the emergency room with altered sensorium, or staring spells. Workup with head CT and MRIs were negative. It was felt that these were related to Parkinson's disease. She had a brain MRI and MRA without contrast on 09/03/2013:No acute intracranial abnormality. 2. Cerebral atrophy and single remote microhemorrhage in the left frontal lobe. 3. Unremarkable head MRA. In addition, have reviewed the images through the PACS system. She had head CT without contrast on 09/03/2013:No acute intracranial pathology seen on CT. 2. Inspissated mucus filling the left maxillary sinus, and mild partial opacification of the mastoid air cells bilaterally. She had head CT without contrast on 09/01/2013: No acute intracranial pathology seen on CT. 2. Mild cortical volume loss noted. 3. Mild partial opacification of the mastoid air cells bilaterally. In addition, reviewed the images through the PACS system.  I saw her on 02/03/13, at which time I increased her Stalevo to 5 times a day. We talked about potential side effects. I asked her to stop the Seroquel and started her on low-dose clonazepam for insomnia and RBD. In the interim she was seen by our nurse practitioner,  Andrea Dorsey on 04/08/2013, at which time I also saw her, and we mutually decided to decrease Stalevo back to 4 times a day because of worsening confusion and hallucinations. Her clonazepam was discontinued by her PCP because of side effects. I suggested a trial of melatonin, 5-10 mg. We checked some labs including CBC, CMP, CRP, ESR and urinalysis. Labs showed no significant abnormalities, mild but stable kidney impairment was noted. She was encouraged to drink more water. She has seen her PCP in April 2015 and was advised to take her fluid pill as needed, an increase of melatonin to 10 mg.   I first met her on 10/15/2012, at which time a continued her Stalevo. I suggested a small dose of Seroquel to help her sleep and tone down the REM behavior disorder and encouraged him to discuss with her primary care physician the addition of an antidepressant. She presents with a complaint of worsening tremors.   She has been in [facility] [address] since 10/18/12. She has a Hx of vivid dreams and tends to act out in  her sleep.   She previously used to see a neurologist at [facility], when she lived in [place], [place]. She was diagnosed with PD about 12 years ago when she was still residing in [place]. She needs assistance with her ADLs. She has been living with her daughter and son-in-law and they have looked into the possibility of a long-term care facility but the patient has been resistant. She has had problems at night including sundowning, confusion, inability to sleep.   Her symptoms started on one side with tremors, but the patient was not sure which side. She has been on Stalevo for the past 2 years, and prior to that she was on C/L, and prior to that she was on Amantadine. She may not have tried a dopamine agonist or rasagiline in the past. She has been experiencing nausea with her PD medications and still has occasional nausea. In March 2014 she developed necrotizing fasciitis and needed debridement  and grafting. She developed hallucinations at the time, but was on pain medications at the time, but the [facility] persisted beyond that. She also started having memory loss then. She developed cellulitis with complications in her jaw and needed all remaining teeth removed and had IV antibiotics in mid-2014. She has no FHx of PD or dementia. She has no Hx of psychiatric premorbid illness. In 2006 she had back surgery. She has no exposure to chemicals, or agent orange. She has been an anxious person. She is not able to sleep at night. She was tried on Ambien and amitriptyline. She has difficulty with sleep onset and sleep maintenance.   She has occasional urinary incontinence, occasional constipation. She snores, and needed to have a sleep study, but did not go. She has had some dream enactments and has slid out of bed.   She had been very opposed to going into assisted living, but understood that she given her complex medical history and multiple issues and advanced Parkinson's disease she was no longer safe to live by herself.      Her Past Medical History Is Significant For: Past Medical History  Diagnosis Date  . Benign essential hypertension   . Hypothyroidism   . Osteoarthritis, generalized   . Parkinson disease   . Spinal stenosis   . History of necrotizing fasciitis     left leg, s/p debridement and graft  . Dementia in Parkinson's disease   . Depression     Her Past Surgical History Is Significant For: Past Surgical History  Procedure Laterality Date  . Skin debridement  2014    Brambhelt, Andrea Dorsey  . Skin graft  2014    Brambhelt Andrea Dorsey  . Spine surgery  2006    spinal stenosis  . Abdominal hysterectomy  1977    Her Family History Is Significant For: Family History  Problem Relation Age of Onset  . Heart disease Mother   . Heart disease Sister   . Hypertension Sister   . Stroke Sister   . Heart disease Sister     heart attack  . Cancer Sister     colon    Her Social History Is  Significant For: History   Social History  . Marital Status: Widowed    Spouse Name: N/A  . Number of Children: 2  . Years of Education: 12   Occupational History  .      retired    Social History Main Topics  . Smoking status: Never Smoker   . Smokeless tobacco: Never Used  .  Alcohol Use: No  . Drug Use: No  . Sexual Activity: Not Currently   Other Topics Concern  . None   Social History Narrative   Patient lives in assisted living.patient is right handed    Her Allergies Are:  Allergies  Allergen Reactions  . Ivp Dye [Iodinated Diagnostic Agents]     Only when intravenous, not on external skin.  . Clindamycin Rash    Unclear whether patient has an actual allergy to clindamycin. On beta-lactam at the same time.  :   Her Current Medications Are:  Outpatient Encounter Prescriptions as of 06/28/2014  Medication Sig  . aspirin EC 81 MG tablet Take 81 mg by mouth daily.  . carbidopa-levodopa (PARCOPA) 25-100 MG per disintegrating tablet Take 1 pill under the tongue up to twice daily. (Patient taking differently: Taking as needed,only when freezing)  . carbidopa-levodopa (SINEMET CR) 50-200 MG per tablet TAKE 1 TABLET BY MOUTH AT BEDTIME  . carbidopa-levodopa-entacapone (STALEVO) 37.5-150-200 MG per tablet Take 1 tablet by mouth 4 (four) times daily.  . Cholecalciferol (VITAMIN D3) 2000 UNITS TABS Take 2,000 Units by mouth daily.   Marland Kitchen escitalopram (LEXAPRO) 10 MG tablet Take 1 tablet (10 mg total) by mouth daily.  . furosemide (LASIX) 20 MG tablet Take 20 mg by mouth. As needed or if weight gain is more than 3 lbs in one day  . guaifenesin (ROBITUSSIN) 100 MG/5ML syrup Take 200 mg by mouth at bedtime as needed for cough.   . hydrALAZINE (APRESOLINE) 25 MG tablet Take 1 tablet (25 mg total) by mouth 3 (three) times daily.  Marland Kitchen levothyroxine (SYNTHROID, LEVOTHROID) 25 MCG tablet Take one tablet by mouth 30 minutes before breakfast once daily for thyroid  . metoprolol succinate  (TOPROL-XL) 25 MG 24 hr tablet Take 2 tablets (50 mg total) by mouth 2 (two) times daily.  Marland Kitchen nystatin (MYCOSTATIN) powder Apply to affected bilateral inguinal areas 2 times daily. Id # [id number]  . OVER THE COUNTER MEDICATION OTC stool softener prn  . OVER THE COUNTER MEDICATION PB8 Probiotic daily  . potassium chloride SA (K-DUR,KLOR-CON) 20 MEQ tablet Take 20 mEq by mouth every three (3) days as needed (Only takes with fluid pill).   Marland Kitchen acetaminophen (TYLENOL) 500 MG tablet Take 500 mg by mouth every 6 (six) hours as needed for mild pain.   . busPIRone (BUSPAR) 15 MG tablet Take 1/2 pill twice daily (Patient not taking: Reported on 06/28/2014)  . senna-docusate (SENOKOT-S) 8.6-50 MG per tablet Take 2 tablets by mouth daily as needed for mild constipation.  . Tdap (BOOSTRIX) 5-2.5-18.5 LF-MCG/0.5 injection Inject 0.5 mLs into the muscle once. (Patient not taking: Reported on 06/28/2014)  . zoster vaccine live, PF, (ZOSTAVAX) 04888 UNT/0.65ML injection Inject 19,400 Units into the skin once. (Patient not taking: Reported on 06/28/2014)  . [DISCONTINUED] traMADol (ULTRAM) 50 MG tablet Take 50 mg by mouth every 8 (eight) hours as needed for moderate pain.  . [DISCONTINUED] triamcinolone (KENALOG) 0.025 % cream Apply 1 application topically as needed (rash).    No facility-administered encounter medications on file as of 06/28/2014.  :  Review of Systems:  Out of a complete 14 point review of systems, all are reviewed and negative with the exception of these symptoms as listed below:   Review of Systems  Neurological:       Daughter reports that patient does not sleep well, states that patient's voice is softer. Daughter has question about patient feeling drainage in throat at night, is  this parkinson's vs post nasal drip.     Objective:  Neurologic Exam  Physical Exam Physical Examination:   Filed Vitals:   06/28/14 1550  BP: 142/88  Pulse: 60  Resp: 16   General Examination: The patient  is a very pleasant 81 y.o. female in no acute distress. She is obese. She appears frail and deconditioned. She is able to provide very little of her own her history today. She is less quiet today and more interactive.   HEENT: Normocephalic, atraumatic, pupils are equal, round and reactive to light and accommodation. Extraocular tracking shows moderate saccadic breakdown without nystagmus noted. There is limitation to upper gaze. There is mild decrease in eye blink rate. Hearing is impaired mildly. Face is symmetric with moderate facial masking and normal facial sensation. There is a mild lower lip and jaw tremor. Neck is moderately rigid with intact passive ROM. There are no carotid bruits on auscultation. Oropharynx exam reveals moderate mouth dryness. There is significant airway crowding noted d/t redundant soft palate and large tongue. She is edentulous. Mallampati is class III. Tongue protrudes centrally and palate elevates symmetrically. There is mild drooling.   Chest: is clear to auscultation without wheezing, rhonchi or crackles noted.  Heart: sounds are regular and normal without murmurs, rubs or gallops noted.   Abdomen: is soft, non-tender and non-distended with normal bowel sounds appreciated on auscultation.  Extremities: There is 1-2+ pitting edema in the distal lower extremities bilaterally. Pedal pulses are intact. Chronic stasis-like changes are noted in the distal legs bilaterally with s/p skin grafting on the right. There are no varicose veins - all unchanged from before.    Skin: is warm and dry with no trophic changes noted. Age-related changes are noted on the skin.   Musculoskeletal: exam reveals no obvious joint deformities, tenderness, joint swelling or erythema.  Neurologically:  Mental status: The patient is awake and alert, paying fairly good attention. She is able to partially provide the history. Her daughter and her caretaker provide most of the history. She is  oriented to: person, place, time/date and situation. Her memory, attention, language and knowledge are impaired mildly. There is no aphasia, agnosia, apraxia or anomia. There is a moderate degree of bradyphrenia. Speech is moderately to severely hypophonic with mild dysarthria noted, however speech is edentulous as well. Mood is congruent and affect is normal today.     On 12/07/2013: MMSE 17/30, CDT: 3/4, AFT: 5/min  On 06/28/2014: MMSE: 15/30, CDT: 2/4, AFT: 11/min.   Cranial nerves are as described above under HEENT exam. In addition, shoulder shrug is normal with equal shoulder height noted.  Motor exam: Normal bulk, and strength for age is noted. There are no dyskinesias noted.  Tone is mildly rigid with presence of cogwheeling in the right>left upper extremity and in both LEs. There is overall moderate bradykinesia. There is no drift or rebound.  There is an intermittent mild to moderate UE resting tremor on the right and a mild resting tremor in the left upper extremity.  Romberg is not tested as she is unstable without holding on.   Reflexes are 1+ in the upper extremities and trace in the knees and absent in the ankles.   Fine motor skills exam: Finger taps are moderately impaired on the right and mild to moderately impaired on the left. Hand movements are moderately impaired on the right and mild to moderately impaired on the left. RAP (rapid alternating patting) is moderately impaired on the right and mildly  impaired on the left. Foot taps are moderate to severely impaired on the right and moderately impaired on the left. Foot agility (in the form of heel stomping) is moderately impaired on the right and moderately impaired on the left.    Cerebellar testing shows no dysmetria or intention tremor on finger to nose testing. Heel to shin is not possible for her. There is no truncal or gait ataxia.   Sensory exam is intact to light touch in the upper and lower extremities.   Gait, station  and balance: She stands up from the seated position with significant difficulty and needs to push herself up with her hands and also requires some assistance. She leans slightly to the R and has a tendency to stagger backwards. Posture is moderately stooped. Stance is wide-based. She walks with significant decrease in stride length and pace and has to rely on her rolling walker which she brought. She maneuvers it fairly well after some start hesitation. Tandem walk is not possible. Balance is moderately impaired. She is not able to do a toe or heel stance. She has mild trouble turning and mild hesitation with turns, but no overt freezing spells.    Assessment and Plan:   In summary, Andrea Dorsey is a very pleasant 81 year old female with an underlying medical history of spinal stenosis, hypothyroidism, osteoarthritis, hypertension, depression, obesity, necrotizing fasciitis of her right leg, history of cellulitis of her jaw, who presents for FU consultation of her advanced, right sided predominant Parkinson's, complicated by hallucinations, anxiety, depression, insomnia and RBD, lower extremity swelling and overall deconditioning and frailty secondary to advancing age and her advancing PD. Today we mutually agreed to continue her current medications. They did not try the BuSpar as she did not have any significant anxiety type spells. Her daughter had it filled and has it available if needed. Previous workup included EEG and brain MRI and MRA from August 2015. She has had more memory loss. She has difficulty with mobility, in particular standing from the seated position. She would benefit from a lift chair which I will prescribe. Andrea Dorsey needs paperwork filled out for [facility]. I would be happy to provide that. She needs time off to bring the patient to her appointments, at least every 3 months and also some time I signed for emergency situations when she may have to leave work ad hoc. They will also need a renewal  on her vehicle disability tag. We will continue with Stalevo 4 times a day, Lexapro 10 mg daily, and Sinemet CR at bedtime. She is advised to use her walker at all times.  in the past, she was on Ambien, amitriptyline, amantadine, clonazepam, and Seroquel at different times with intolerance to these medications. I will see her back in 3 months, sooner if the need arises. I answered all their questions today and the patient and her daughter were in agreement.   I spent 25 minutes in total face-to-face time with the patient, more than 50% of which was spent in counseling and coordination of care, reviewing test results, reviewing medication and discussing or reviewing the diagnosis of PD, its prognosis and treatment options.

## 2014-06-28 NOTE — Patient Instructions (Addendum)
Please avoid "DM" type cough medicine.   Try over the counter mucinex as needed.   We will keep your medications the same.   I will renew your handicap parking.   I will fill out FMLA paperwork for your daughter.   I will order a lift chair for you.   Follow up in about 3 months.

## 2014-06-29 ENCOUNTER — Telehealth: Payer: Self-pay | Admitting: *Deleted

## 2014-06-29 NOTE — Telephone Encounter (Signed)
Patient form mailed on 06/29/14 to patient address.

## 2014-07-03 ENCOUNTER — Encounter: Payer: Self-pay | Admitting: Internal Medicine

## 2014-07-03 ENCOUNTER — Ambulatory Visit (INDEPENDENT_AMBULATORY_CARE_PROVIDER_SITE_OTHER): Payer: Medicare Other | Admitting: Internal Medicine

## 2014-07-03 VITALS — BP 140/90 | HR 67 | Temp 98.3°F | Resp 20 | Ht 65.0 in | Wt 204.4 lb

## 2014-07-03 DIAGNOSIS — G20 Parkinson's disease: Secondary | ICD-10-CM | POA: Diagnosis not present

## 2014-07-03 DIAGNOSIS — G20A1 Parkinson's disease without dyskinesia, without mention of fluctuations: Secondary | ICD-10-CM

## 2014-07-03 DIAGNOSIS — E669 Obesity, unspecified: Secondary | ICD-10-CM | POA: Diagnosis not present

## 2014-07-03 DIAGNOSIS — K5901 Slow transit constipation: Secondary | ICD-10-CM

## 2014-07-03 DIAGNOSIS — E034 Atrophy of thyroid (acquired): Secondary | ICD-10-CM

## 2014-07-03 DIAGNOSIS — E038 Other specified hypothyroidism: Secondary | ICD-10-CM

## 2014-07-03 DIAGNOSIS — R739 Hyperglycemia, unspecified: Secondary | ICD-10-CM | POA: Diagnosis not present

## 2014-07-03 DIAGNOSIS — F0280 Dementia in other diseases classified elsewhere without behavioral disturbance: Secondary | ICD-10-CM

## 2014-07-03 DIAGNOSIS — I5032 Chronic diastolic (congestive) heart failure: Secondary | ICD-10-CM | POA: Diagnosis not present

## 2014-07-03 MED ORDER — TETANUS-DIPHTH-ACELL PERTUSSIS 5-2.5-18.5 LF-MCG/0.5 IM SUSP: 0.5000 mL | Freq: Once | INTRAMUSCULAR | Status: DC

## 2014-07-03 MED ORDER — ZOSTER VACCINE LIVE 19400 UNT/0.65ML ~~LOC~~ SOLR: 0.6500 mL | Freq: Once | SUBCUTANEOUS | Status: DC

## 2014-07-03 NOTE — Progress Notes (Signed)
Patient ID: Andrea Dorsey, female   DOB: 1929/11/05, 81 y.o.   MRN: [id number]   Location:  [facility]  Code Status: DNR Goals of Care: Advanced Directive information Does patient have an advance directive?: Yes, Type of Advance Directive: [place];Living will, Does patient want to make changes to advanced directive?: No - Patient declined   Chief Complaint  Patient presents with  . Medical Management of Chronic Issues    medication management  . Immunizations    needs script for zoster and Tetanus    HPI: Patient is a 81 y.o. black female seen in the office today for med mgt of chronic diseases.    Pt says everything is the same.    Her cataracts are going to be assessed in 2 weeks.  Vision has been blurry.    Has been eating a lot and eats quite a bit of sweets.  No salty stuff--was taken off the salty food.  Ankles are looking thinner.    Denies shortness of breath recently.  Walking some with her rollator walker in the house.  Has been using the foot pedals.  Wants to elevate feet at night to help with circulation.    Regular big bowel movements.    Pretty good mood lately.  Not worrying like she did.  Does get to sleep ok, but does wake up at times.  Sometimes it's to urinate, but not always.  Sometimes can get back to sleep.  If not will get into her chair and wait till morning.    Discussed use of gabapentin therapy for her tingling in her feet.  Note written on after visit summary asking her daughter if she's taken it before or if she feels she needs it.  Caregiver thought it might help with nerve pain and with her sleep.  Review of Systems:  Review of Systems  Constitutional: Negative for fever and chills.  HENT: Negative for hearing loss.   Eyes: Positive for blurred vision.  Respiratory: Negative for shortness of breath.   Cardiovascular: Positive for leg swelling. Negative for chest pain.       Improved   Gastrointestinal: Negative for abdominal pain, constipation, blood in stool and melena.  Genitourinary: Negative for dysuria.  Musculoskeletal: Positive for joint pain. Negative for myalgias and falls.  Skin: Negative for rash.  Neurological: Negative for dizziness and headaches.  Endo/Heme/Allergies: Bruises/bleeds easily.  Psychiatric/Behavioral: Positive for memory loss. Negative for depression. The patient has insomnia. The patient is not nervous/anxious.     Past Medical History  Diagnosis Date  . Benign essential hypertension   . Hypothyroidism   . Osteoarthritis, generalized   . Parkinson disease   . Spinal stenosis   . History of necrotizing fasciitis     left leg, s/p debridement and graft  . Dementia in Parkinson's disease   . Depression     Past Surgical History  Procedure Laterality Date  . Skin debridement  2014    Brambhelt, MD  . Skin graft  2014    Brambhelt MD  . Spine surgery  2006    spinal stenosis  . Abdominal hysterectomy  1977    Allergies  Allergen Reactions  . Ivp Dye [Iodinated Diagnostic Agents]     Only when intravenous, not on external skin.  . Clindamycin Rash    Unclear whether patient has an actual allergy to clindamycin. On beta-lactam at the same time.   Medications: Patient's Medications  New Prescriptions   No medications on file  Previous Medications   ACETAMINOPHEN (TYLENOL) 500 MG TABLET    Take 500 mg by mouth every 6 (six) hours as needed for mild pain.    ASPIRIN EC 81 MG TABLET    Take 81 mg by mouth daily.   CARBIDOPA-LEVODOPA (PARCOPA) 25-100 MG PER DISINTEGRATING TABLET    Take 1 pill under the tongue up to twice daily.   CARBIDOPA-LEVODOPA (SINEMET CR) 50-200 MG PER TABLET    TAKE 1 TABLET BY MOUTH AT BEDTIME   CARBIDOPA-LEVODOPA-ENTACAPONE (STALEVO) 37.5-150-200 MG PER TABLET    Take 1 tablet by mouth 4 (four) times daily.   CHOLECALCIFEROL (VITAMIN D3) 2000 UNITS TABS    Take 2,000 Units by mouth daily.     ESCITALOPRAM (LEXAPRO) 10 MG TABLET    Take 1 tablet (10 mg total) by mouth daily.   FUROSEMIDE (LASIX) 20 MG TABLET    Take 20 mg by mouth. As needed or if weight gain is more than 3 lbs in one day   HYDRALAZINE (APRESOLINE) 25 MG TABLET    Take 1 tablet (25 mg total) by mouth 3 (three) times daily.   LEVOTHYROXINE (SYNTHROID, LEVOTHROID) 25 MCG TABLET    Take one tablet by mouth 30 minutes before breakfast once daily for thyroid   METOPROLOL SUCCINATE (TOPROL-XL) 25 MG 24 HR TABLET    Take 2 tablets (50 mg total) by mouth 2 (two) times daily.   NYSTATIN (MYCOSTATIN) POWDER    Apply to affected bilateral inguinal areas 2 times daily. Id # [id number]   OVER THE COUNTER MEDICATION    OTC stool softener prn   OVER THE COUNTER MEDICATION    PB8 Probiotic daily   POTASSIUM CHLORIDE SA (K-DUR,KLOR-CON) 20 MEQ TABLET    Take 20 mEq by mouth every three (3) days as needed (Only takes with fluid pill).    PSEUDOEPHEDRINE-GUAIFENESIN (MUCINEX D PO)    Take by mouth. Take 1 teaspoon as needed   SENNA-DOCUSATE (SENOKOT-S) 8.6-50 MG PER TABLET    Take 2 tablets by mouth daily as needed for mild constipation.  Modified Medications   Modified Medication Previous Medication   TDAP (BOOSTRIX) 5-2.5-18.5 LF-MCG/0.5 INJECTION Tdap (BOOSTRIX) 5-2.5-18.5 LF-MCG/0.5 injection      Inject 0.5 mLs into the muscle once.    Inject 0.5 mLs into the muscle once.   ZOSTER VACCINE LIVE, PF, (ZOSTAVAX) 50354 UNT/0.65ML INJECTION zoster vaccine live, PF, (ZOSTAVAX) 65681 UNT/0.65ML injection      Inject 19,400 Units into the skin once.    Inject 19,400 Units into the skin once.  Discontinued Medications   BUSPIRONE (BUSPAR) 15 MG TABLET    Take 1/2 pill twice daily   GUAIFENESIN (ROBITUSSIN) 100 MG/5ML SYRUP    Take 200 mg by mouth at bedtime as needed for cough.     Physical Exam: Filed Vitals:   07/03/14 1538  BP: 140/90  Pulse: 67  Temp: 98.3 F (36.8 C)  TempSrc: Oral  Resp: 20  Height: 5\' 5"  (1.651 m)    Weight: 204 lb 6.4 oz (92.715 kg)  SpO2: 95%   Physical Exam  Constitutional: She appears well-developed and well-nourished. No distress.  Obese black female  Cardiovascular: Intact distal pulses.   irreg irreg  Pulmonary/Chest: Effort normal and breath sounds normal. She has no rales.  Abdominal: Soft. Bowel sounds are normal. She exhibits no distension. There is no tenderness.  Musculoskeletal: Normal range of motion.  Shuffling gait with stooped posture,  walks with rollator walker  Neurological: She is alert.  Skin: Skin is warm and dry.  Psychiatric: She has a normal mood and affect.    Labs reviewed: Basic Metabolic Panel:  Recent Labs  07/14/13 1648  09/03/13 0550 10/27/13 0933 02/20/14 1213  NA 144  < > 142 142 144  K 5.1  < > 4.4 4.1 5.2  CL 103  < > 107 103 107  CO2 23  < > 24 25 24   GLUCOSE 106*  < > 104* 126* 95  BUN 21  < > 20 20 16   CREATININE 1.32*  < > 1.05 1.24* 1.13*  CALCIUM 10.3  < > 9.8 10.0 10.4*  TSH 4.060  --   --  3.330  --   < > = values in this interval not displayed. Liver Function Tests:  Recent Labs  09/01/13 0530 09/03/13 0550 10/27/13 0933  AST 17 16 41*  ALT <5 <5 13  ALKPHOS 89 81 86  BILITOT 1.0 1.0 1.2  PROT 6.1 6.0 6.0  ALBUMIN 3.2* 3.2*  --    No results for input(s): LIPASE, AMYLASE in the last 8760 hours. No results for input(s): AMMONIA in the last 8760 hours. CBC:  Recent Labs  07/14/13 1648  09/01/13 0530 09/03/13 0550 10/27/13 0933  WBC 5.9  < > 5.6 5.1 7.5  NEUTROABS 3.1  --  3.0  --  5.6  HGB 14.9  < > 14.9 14.4 16.2*  HCT 45.0  < > 44.4 43.6 48.3*  MCV 87  < > 85.7 85.7 88  PLT 218  < > 202 172 171  < > = values in this interval not displayed. Lipid Panel: No results for input(s): CHOL, HDL, LDLCALC, TRIG, CHOLHDL, LDLDIRECT in the last 8760 hours. Lab Results  Component Value Date   HGBA1C 6.7* 02/20/2014    Assessment/Plan 1. Chronic diastolic CHF (congestive heart failure) - has been  stable -has gained some weight--is eating very well so this is likely why as edema seems improved and her caregiver says they have not reached the level where they intervene with lasix  -f/u labs: - Comprehensive metabolic panel - CBC with Differential/Platelet  2. Hypothyroidism due to acquired atrophy of thyroid -cont current synthroid and f/u labs - TSH  3. Hyperglycemia - I thought she was diabetic when she was first seen as a new patient -f/u labs b/c she's been eating very well and hba1c was elevated - Hemoglobin A1c - Comprehensive metabolic panel - CBC with Differential/Platelet  4. Dementia in Parkinson's disease - stable, no recent changes  5. Parkinson disease -cont current treatment and f/u with [name] as planned  6. Slow transit constipation - bowels are moving daily at this point, no changes  7. Obesity -encouraged regular activity with walking with rollator and chair exercises and high fiber diet  Labs/tests ordered:   Orders Placed This Encounter  Procedures  . Hemoglobin A1c  . Comprehensive metabolic panel  . CBC with Differential/Platelet  . TSH   Next appt:  3 mos  Noreene Boreman L. Elbie Statzer, D.O. [facility] [address], [place] [address] Cell Phone (Mon-Fri 8am-5pm):  [phone] On Call:  [phone] & follow prompts after 5pm & weekends Office Phone:  [phone] Office Fax:  [phone]

## 2014-07-03 NOTE — Patient Instructions (Signed)
Considering gabapentin (neurontin) 100mg  at bedtime for tingling pain in her legs.  Let me know if she's ever taken this.  It might have the added bonus of helping with sleep.

## 2014-07-04 ENCOUNTER — Telehealth: Payer: Self-pay | Admitting: *Deleted

## 2014-07-04 LAB — COMPREHENSIVE METABOLIC PANEL
ALT: 4 IU/L (ref 0–32)
AST: 10 IU/L (ref 0–40)
Albumin/Globulin Ratio: 1.9 (ref 1.1–2.5)
Albumin: 4 g/dL (ref 3.5–4.7)
Alkaline Phosphatase: 91 IU/L (ref 39–117)
BUN/Creatinine Ratio: 18 (ref 11–26)
BUN: 21 mg/dL (ref 8–27)
Bilirubin Total: 0.5 mg/dL (ref 0.0–1.2)
CO2: 25 mmol/L (ref 18–29)
Calcium: 10.1 mg/dL (ref 8.7–10.3)
Chloride: 105 mmol/L (ref 97–108)
Creatinine, Ser: 1.17 mg/dL — ABNORMAL HIGH (ref 0.57–1.00)
GFR calc Af Amer: 50 mL/min/{1.73_m2} — ABNORMAL LOW (ref >59)
GFR calc non Af Amer: 43 mL/min/{1.73_m2} — ABNORMAL LOW (ref >59)
Globulin, Total: 2.1 g/dL (ref 1.5–4.5)
Glucose: 110 mg/dL — ABNORMAL HIGH (ref 65–99)
Potassium: 4.6 mmol/L (ref 3.5–5.2)
Sodium: 143 mmol/L (ref 134–144)
Total Protein: 6.1 g/dL (ref 6.0–8.5)

## 2014-07-04 LAB — HEMOGLOBIN A1C
Est. average glucose Bld gHb Est-mCnc: 126 mg/dL
Hgb A1c MFr Bld: 6 % — ABNORMAL HIGH (ref 4.8–5.6)

## 2014-07-04 LAB — CBC WITH DIFFERENTIAL/PLATELET
Basophils Absolute: 0.1 10*3/uL (ref 0.0–0.2)
Basos: 1 %
EOS (ABSOLUTE): 0.2 10*3/uL (ref 0.0–0.4)
Eos: 4 %
Hematocrit: 48.8 % — ABNORMAL HIGH (ref 34.0–46.6)
Hemoglobin: 16.6 g/dL — ABNORMAL HIGH (ref 11.1–15.9)
Immature Grans (Abs): 0 10*3/uL (ref 0.0–0.1)
Immature Granulocytes: 0 %
Lymphocytes Absolute: 1.7 10*3/uL (ref 0.7–3.1)
Lymphs: 31 %
MCH: 30.8 pg (ref 26.6–33.0)
MCHC: 34 g/dL (ref 31.5–35.7)
MCV: 91 fL (ref 79–97)
Monocytes Absolute: 0.6 10*3/uL (ref 0.1–0.9)
Monocytes: 11 %
Neutrophils Absolute: 2.9 10*3/uL (ref 1.4–7.0)
Neutrophils: 53 %
Platelets: 161 10*3/uL (ref 150–379)
RBC: 5.39 x10E6/uL — ABNORMAL HIGH (ref 3.77–5.28)
RDW: 14.4 % (ref 12.3–15.4)
WBC: 5.4 10*3/uL (ref 3.4–10.8)

## 2014-07-04 LAB — TSH: TSH: 3.34 u[IU]/mL (ref 0.450–4.500)

## 2014-07-04 MED ORDER — GABAPENTIN 100 MG PO CAPS: ORAL_CAPSULE | ORAL | Status: DC

## 2014-07-04 NOTE — Telephone Encounter (Signed)
[name] notified and Rx faxed to pharmacy

## 2014-07-04 NOTE — Telephone Encounter (Signed)
As far as I know, gabapentin should not interact with her other meds.  Let's start her on gabapentin 100mg  po qhs for peripheral neuropathy.

## 2014-07-04 NOTE — Telephone Encounter (Signed)
Patient daughter, [name] stated that patient was seen yesterday and there was a note written on her AVS to call the office to let us know if patient has ever taken Gabapentin before and daughter states she is not aware that patient has ever taken this medication before but wants to make sure it doesn't interact with any other medications patient is on. Please Advise.

## 2014-07-05 ENCOUNTER — Other Ambulatory Visit: Payer: Self-pay | Admitting: Internal Medicine

## 2014-07-05 DIAGNOSIS — D751 Secondary polycythemia: Secondary | ICD-10-CM

## 2014-07-05 NOTE — Progress Notes (Signed)
Referral placed to hematology for further eval of elevated h/h.

## 2014-07-06 ENCOUNTER — Telehealth: Payer: Self-pay

## 2014-07-06 NOTE — Telephone Encounter (Signed)
-----   Message from Gayland Curry, DO sent at 07/05/2014 12:48 PM EDT ----- Please notify [name]:  Let's have her see Dr. Alvy Bimler from hematology.  Most likely, we will just be observing her, but it would be nice to have her opinion on file. ----- Message -----    From: Oralia Manis, CMA    Sent: 07/05/2014  10:41 AM      To: Gayland Curry, DO  Dr. [name], spoke with daughter about your notes she says no that her mother has never seen a hematologist.  She would like to know if you want her too take her mother to see a hematologist and if so could you recommend one for her to go to.

## 2014-07-06 NOTE — Telephone Encounter (Signed)
Dr. Cyndi Lennert instructions are for patient to see Dr. Alvy Bimler will let daughter know when I speak to her tried to call got no answer.

## 2014-07-07 ENCOUNTER — Telehealth: Payer: Self-pay

## 2014-07-07 NOTE — Telephone Encounter (Signed)
-----   Message from Gayland Curry, DO sent at 07/05/2014 12:48 PM EDT ----- Please notify [name]:  Let's have her see Dr. Alvy Bimler from hematology.  Most likely, we will just be observing her, but it would be nice to have her opinion on file. ----- Message -----    From: Oralia Manis, CMA    Sent: 07/05/2014  10:41 AM      To: Gayland Curry, DO  Dr. [name], spoke with daughter about your notes she says no that her mother has never seen a hematologist.  She would like to know if you want her too take her mother to see a hematologist and if so could you recommend one for her to go to.

## 2014-07-07 NOTE — Telephone Encounter (Signed)
Called and left a message for Miss Gindlesperger's daughter.

## 2014-07-11 ENCOUNTER — Other Ambulatory Visit: Payer: Self-pay | Admitting: Internal Medicine

## 2014-07-11 ENCOUNTER — Other Ambulatory Visit: Payer: Self-pay | Admitting: *Deleted

## 2014-07-11 MED ORDER — LEVOTHYROXINE SODIUM 25 MCG PO TABS: ORAL_TABLET | ORAL | Status: DC

## 2014-07-11 NOTE — Telephone Encounter (Signed)
[place]

## 2014-07-14 ENCOUNTER — Encounter: Payer: Self-pay | Admitting: Internal Medicine

## 2014-07-17 ENCOUNTER — Ambulatory Visit: Payer: Self-pay

## 2014-07-17 ENCOUNTER — Ambulatory Visit: Payer: Self-pay | Admitting: Hematology and Oncology

## 2014-08-14 ENCOUNTER — Telehealth: Payer: Self-pay | Admitting: Neurology

## 2014-08-14 NOTE — Telephone Encounter (Signed)
Pt's daughter called and states that the pt will be having eye surgery. The surgeon needs medical clearance, Dr. Connye Burkitt, fax [phone], [facility]. May call daughter back at [phone]

## 2014-08-14 NOTE — Telephone Encounter (Signed)
I left message for daughter that I have a  faxed confirmation form for release of surgery that was signed and faxed to them on 7/18. I left our fax number if they need to send [place] anything else. And call back number for further questions.

## 2014-08-30 ENCOUNTER — Other Ambulatory Visit: Payer: Self-pay | Admitting: Neurology

## 2014-09-07 ENCOUNTER — Telehealth: Payer: Self-pay | Admitting: *Deleted

## 2014-09-07 NOTE — Telephone Encounter (Signed)
Andrea Dorsey, POA called and wanted to know what you suggest her mother take for cough and congestion. No Fever.Cough up clear congestion. Started Tuesday. She wants something OTC. Please Advise.

## 2014-09-08 NOTE — Telephone Encounter (Signed)
Coricidin bp cough syrup should be helpful due to her problems with hypertension.  If this worsens, she develops a fever, she needs to be seen here by one of [place].  She should increase hydration also to thin secretions.

## 2014-09-08 NOTE — Telephone Encounter (Signed)
Patient daughter notified and agreed. Will call if symptoms worsen.

## 2014-10-04 ENCOUNTER — Other Ambulatory Visit: Payer: Self-pay | Admitting: *Deleted

## 2014-10-04 MED ORDER — GABAPENTIN 100 MG PO CAPS: ORAL_CAPSULE | ORAL | Status: DC

## 2014-10-04 NOTE — Telephone Encounter (Signed)
[place]

## 2014-10-05 ENCOUNTER — Other Ambulatory Visit: Payer: Self-pay

## 2014-10-05 DIAGNOSIS — R6889 Other general symptoms and signs: Secondary | ICD-10-CM

## 2014-10-05 DIAGNOSIS — F329 Major depressive disorder, single episode, unspecified: Secondary | ICD-10-CM

## 2014-10-05 DIAGNOSIS — G20A1 Parkinson's disease without dyskinesia, without mention of fluctuations: Secondary | ICD-10-CM

## 2014-10-05 DIAGNOSIS — F419 Anxiety disorder, unspecified: Secondary | ICD-10-CM

## 2014-10-05 DIAGNOSIS — F32A Depression, unspecified: Secondary | ICD-10-CM

## 2014-10-05 DIAGNOSIS — G20 Parkinson's disease: Secondary | ICD-10-CM

## 2014-10-05 MED ORDER — ESCITALOPRAM OXALATE 10 MG PO TABS: 10.0000 mg | ORAL_TABLET | Freq: Every day | ORAL | Status: DC

## 2014-10-29 ENCOUNTER — Other Ambulatory Visit: Payer: Self-pay | Admitting: Internal Medicine

## 2014-10-30 ENCOUNTER — Ambulatory Visit: Payer: Medicare Other | Admitting: Neurology

## 2014-11-15 ENCOUNTER — Encounter: Payer: Self-pay | Admitting: Neurology

## 2014-11-15 ENCOUNTER — Ambulatory Visit (INDEPENDENT_AMBULATORY_CARE_PROVIDER_SITE_OTHER): Payer: Medicare Other | Admitting: Neurology

## 2014-11-15 VITALS — BP 132/88 | HR 78 | Resp 16 | Ht 65.0 in | Wt 211.0 lb

## 2014-11-15 DIAGNOSIS — G4700 Insomnia, unspecified: Secondary | ICD-10-CM

## 2014-11-15 DIAGNOSIS — G20 Parkinson's disease: Secondary | ICD-10-CM | POA: Diagnosis not present

## 2014-11-15 DIAGNOSIS — F329 Major depressive disorder, single episode, unspecified: Secondary | ICD-10-CM | POA: Diagnosis not present

## 2014-11-15 DIAGNOSIS — F419 Anxiety disorder, unspecified: Secondary | ICD-10-CM

## 2014-11-15 DIAGNOSIS — R413 Other amnesia: Secondary | ICD-10-CM

## 2014-11-15 DIAGNOSIS — F32A Depression, unspecified: Secondary | ICD-10-CM

## 2014-11-15 MED ORDER — GABAPENTIN 100 MG PO CAPS: 200.0000 mg | ORAL_CAPSULE | Freq: Every day | ORAL | Status: DC

## 2014-11-15 NOTE — Patient Instructions (Addendum)
We will keep your Stalevo the same, four times a day, as you had hallucinations on a higher dose. For your back pain, use as needed tylenol and a microwave heat pad.  We will increase your night time gabapentin to 200 mg each night in the hope to help you sleep better and improve your back discomfort.

## 2014-11-15 NOTE — Progress Notes (Signed)
Subjective:    Patient ID: Andrea Dorsey is a 81 y.o. female.  HPI     Interim history:  Ms. Vrooman is a very pleasant 81 year old right-handed woman with an underlying complex medical history of spinal stenosis, necrotizing fasciitis, status post debridement and grafting on the right lower leg, lower extremity edema, insomnia, hypothyroidism, osteoarthritis, hypertension, depression, and obesity, who presents for followup consultation of her advanced, right-sided predominant Parkinson's disease, complicated by hallucinations, memory loss, mood disorder including anxiety and depression, sleep disorder including insomnia, OSA and RBD and advancing age. She is accompanied by her daughter today. I last saw her on 06/28/2014, at which time her daughter reported that things were fairly stable. Patient was reporting a cough at night. She was taking cough medicine daily. She reported postnasal drip and drooling at night. Her speech was softer. She was walking with a walker and thankfully had not fallen recently. Her appetite was good. She needed more assistance. She had a lift chair. She was on Stalevo 1 pill 4 times a day at 4 hourly intervals. She was on Sinemet CR at night. They never tried the BuSpar as the anxiety episodes or spells subsided. Her daughter suspected that these spells were in the context of a certain caretaker that cause stress to the patient. She was on Lexapro 10 mg daily. Her daughter requested FMLA paperwork so she could make it to the appointments and also be able to go home in an emergency situation.  Today, 11/15/2014: she reports more numbness in her legs. She has more difficulty with bladder control. She has not fallen, anxiety stable. She uses her walker okay. Stalevo is 150 mg 4 times a day and on higher dose she had more hallucinations. She has ongoing issues with LBP.   Previously:  I saw her on 03/16/2014, at which time her daughter reported ongoing issues with spells of  decreased alertness. She would not lose full consciousness but became listless and limp. She would not follow commands. The duration would be minutes to maybe 2 hours. Checking blood pressure at this times showed that she had some high blood pressure at the time. There was no convulsion, no tongue bite, no full loss of consciousness, no loss of bowel or bladder control except for one time when she did wet herself. There was no time predilection. I suggested we continue with Lexapro at 10 mg and add low-dose BuSpar for anxiety. We have done workup in the form of brain scans and EEG in the recent past. She was no longer on Parcopa as it did not help. She was not sleeping well. We kept the Sinemet CR the same at night.  I saw her on 12/07/2013, at which time I talked to the patient's daughter, [name], separately before seeing the patient. Her daughter was concerned about the patient's general decline. She had more dream enactments. The patient felt that she was getting worse and that she could not sleep at night. She was dozing off during the day. She had not fallen thankfully. She could not tolerate clonazepam. She also had side effects on low-dose Seroquel. Melatonin at 10 mg at night did not seem to help. She was not drinking enough water. She had moved to a new home and had 3 caretakers taking turns and she was not without supervision. On the weekends her family will take turns supervising and helping. I increased her Lexapro and kept her other medications the same.     I saw her on 09/07/13,  at which time her daughter reported no recent staring or zoning out spells. She was in the process of moving to a townhome with a caretaker for 4 days and 3 nights and family staying with her the rest of the time. I ordered an EEG. I also asked her to take Sinemet CR at night to get her through the night a little bit better. I started her on a low-dose Lexapro 5 mg strength. Her EEG on 09/22/2013 was reported as normal in  the awake state. Her daughter requested a sooner appointment for more hallucinations and confusion. She had more memory loss.   I saw her on 06/08/2013, at which time I suggested she continue with Stalevo 150 4 times a day. She previously has had hallucinations with increased doses. I suggested adding a little extra levodopa in the form of Parcopa half a pill up to twice daily as needed. This was supposed to help with freezing. In the interim, about a month later on 07/13/2013 she was seen by Charlott Holler, NP for a sooner than scheduled appointment at which time the Parcopa dose was increased to one whole pill up to twice daily for rigidity and freezing. She presented to the emergency room on 07/15/2013 with generalized complaint of weakness and freezing and was advised to increase her Parcopa. She presented voice recently this month to the emergency room with altered sensorium, or staring spells. Workup with head CT and MRIs were negative. It was felt that these were related to Parkinson's disease. She had a brain MRI and MRA without contrast on 09/03/2013:No acute intracranial abnormality. 2. Cerebral atrophy and single remote microhemorrhage in the left frontal lobe. 3. Unremarkable head MRA. In addition, have reviewed the images through the PACS system. She had head CT without contrast on 09/03/2013:No acute intracranial pathology seen on CT. 2. Inspissated mucus filling the left maxillary sinus, and mild partial opacification of the mastoid air cells bilaterally. She had head CT without contrast on 09/01/2013: No acute intracranial pathology seen on CT. 2. Mild cortical volume loss noted. 3. Mild partial opacification of the mastoid air cells bilaterally. In addition, reviewed the images through the PACS system.  I saw her on 02/03/13, at which time I increased her Stalevo to 5 times a day. We talked about potential side effects. I asked her to stop the Seroquel and started her on low-dose clonazepam for  insomnia and RBD. In the interim she was seen by our nurse practitioner, Ms. Lam on 04/08/2013, at which time I also saw her, and we mutually decided to decrease Stalevo back to 4 times a day because of worsening confusion and hallucinations. Her clonazepam was discontinued by her PCP because of side effects. I suggested a trial of melatonin, 5-10 mg. We checked some labs including CBC, CMP, CRP, ESR and urinalysis. Labs showed no significant abnormalities, mild but stable kidney impairment was noted. She was encouraged to drink more water. She has seen her PCP in April 2015 and was advised to take her fluid pill as needed, an increase of melatonin to 10 mg.   I first met her on 10/15/2012, at which time a continued her Stalevo. I suggested a small dose of Seroquel to help her sleep and tone down the REM behavior disorder and encouraged him to discuss with her primary care physician the addition of an antidepressant. She presents with a complaint of worsening tremors.   She has been in [facility] [address] since 10/18/12. She  has a Hx of vivid dreams and tends to act out in her sleep.   She previously used to see a neurologist at [facility], when she lived in [place], [place]. She was diagnosed with PD about 12 years ago when she was still residing in [place]. She needs assistance with her ADLs. She has been living with her daughter and son-in-law and they have looked into the possibility of a long-term care facility but the patient has been resistant. She has had problems at night including sundowning, confusion, inability to sleep.   Her symptoms started on one side with tremors, but the patient was not sure which side. She has been on Stalevo for the past 2 years, and prior to that she was on C/L, and prior to that she was on Amantadine. She may not have tried a dopamine agonist or rasagiline in the past. She has been experiencing nausea with her PD medications and still has occasional nausea.  In March 2014 she developed necrotizing fasciitis and needed debridement and grafting. She developed hallucinations at the time, but was on pain medications at the time, but the [facility] persisted beyond that. She also started having memory loss then. She developed cellulitis with complications in her jaw and needed all remaining teeth removed and had IV antibiotics in mid-2014. She has no FHx of PD or dementia. She has no Hx of psychiatric premorbid illness. In 2006 she had back surgery. She has no exposure to chemicals, or agent orange. She has been an anxious person. She is not able to sleep at night. She was tried on Ambien and amitriptyline. She has difficulty with sleep onset and sleep maintenance.   She has occasional urinary incontinence, occasional constipation. She snores, and needed to have a sleep study, but did not go. She has had some dream enactments and has slid out of bed.   She had been very opposed to going into assisted living, but understood that she given her complex medical history and multiple issues and advanced Parkinson's disease she was no longer safe to live by herself.    Her Past Medical History Is Significant For: Past Medical History  Diagnosis Date  . Benign essential hypertension   . Hypothyroidism   . Osteoarthritis, generalized   . Parkinson disease ([place])   . Spinal stenosis   . History of necrotizing fasciitis     left leg, s/p debridement and graft  . Dementia in Parkinson's disease ([place])   . Depression     Her Past Surgical History Is Significant For: Past Surgical History  Procedure Laterality Date  . Skin debridement  2014    Brambhelt, MD  . Skin graft  2014    Brambhelt MD  . Spine surgery  2006    spinal stenosis  . Abdominal hysterectomy  1977    Her Family History Is Significant For: Family History  Problem Relation Age of Onset  . Heart disease Mother   . Heart disease Sister   . Hypertension Sister   . Stroke Sister   . Heart disease  Sister     heart attack  . Cancer Sister     colon    Her Social History Is Significant For: Social History   Social History  . Marital Status: Widowed    Spouse Name: N/A  . Number of Children: 2  . Years of Education: 12   Occupational History  .      retired    Social History Main Topics  .  Smoking status: Never Smoker   . Smokeless tobacco: Never Used  . Alcohol Use: No  . Drug Use: No  . Sexual Activity: Not Currently   Other Topics Concern  . None   Social History Narrative   Patient lives in assisted living.patient is right handed    Her Allergies Are:  Allergies  Allergen Reactions  . Ivp Dye [Iodinated Diagnostic Agents]     Only when intravenous, not on external skin.  . Clindamycin Rash    Unclear whether patient has an actual allergy to clindamycin. On beta-lactam at the same time.  :   Her Current Medications Are:  Outpatient Encounter Prescriptions as of 11/15/2014  Medication Sig  . acetaminophen (TYLENOL) 500 MG tablet Take 500 mg by mouth every 6 (six) hours as needed for mild pain.   Marland Kitchen aspirin EC 81 MG tablet Take 81 mg by mouth daily.  . carbidopa-levodopa (PARCOPA) 25-100 MG per disintegrating tablet TAKE 1 PILL UNDER THE TONGUE UP TO TWICE A DAY  . carbidopa-levodopa (SINEMET CR) 50-200 MG per tablet TAKE 1 TABLET BY MOUTH AT BEDTIME  . carbidopa-levodopa-entacapone (STALEVO) 37.5-150-200 MG per tablet Take 1 tablet by mouth 4 (four) times daily.  . Cholecalciferol (VITAMIN D3) 2000 UNITS TABS Take 2,000 Units by mouth daily.   Marland Kitchen escitalopram (LEXAPRO) 10 MG tablet Take 1 tablet (10 mg total) by mouth daily.  . furosemide (LASIX) 20 MG tablet Take 20 mg by mouth. As needed or if weight gain is more than 3 lbs in one day  . gabapentin (NEURONTIN) 100 MG capsule TAKE ONE CAPSULE ONCE DAILY AT BEDTIME FOR PERIPHERAL NEUROPATHY  . hydrALAZINE (APRESOLINE) 25 MG tablet TAKE 1 BY MOUTH 3 TIMES DAILY  . levothyroxine (SYNTHROID, LEVOTHROID) 25 MCG  tablet Take one tablet by mouth 30 minutes before breakfast once daily for thyroid  . metoprolol succinate (TOPROL-XL) 25 MG 24 hr tablet Take 2 tablets (50 mg total) by mouth 2 (two) times daily.  Marland Kitchen OVER THE COUNTER MEDICATION OTC stool softener prn  . Tdap (BOOSTRIX) 5-2.5-18.5 LF-MCG/0.5 injection Inject 0.5 mLs into the muscle once. (Patient not taking: Reported on 11/15/2014)  . zoster vaccine live, PF, (ZOSTAVAX) 10258 UNT/0.65ML injection Inject 19,400 Units into the skin once. (Patient not taking: Reported on 11/15/2014)  . [DISCONTINUED] gabapentin (NEURONTIN) 100 MG capsule Take one capsule once daily at bedtime for peripheral Neuropathy  . [DISCONTINUED] nystatin (MYCOSTATIN) powder Apply to affected bilateral inguinal areas 2 times daily. Id # [id number]  . [DISCONTINUED] OVER THE COUNTER MEDICATION PB8 Probiotic daily  . [DISCONTINUED] potassium chloride SA (K-DUR,KLOR-CON) 20 MEQ tablet Take 20 mEq by mouth every three (3) days as needed (Only takes with fluid pill).   . [DISCONTINUED] Pseudoephedrine-Guaifenesin (MUCINEX D PO) Take by mouth. Take 1 teaspoon as needed  . [DISCONTINUED] senna-docusate (SENOKOT-S) 8.6-50 MG per tablet Take 2 tablets by mouth daily as needed for mild constipation.   No facility-administered encounter medications on file as of 11/15/2014.  :  Review of Systems:  Out of a complete 14 point review of systems, all are reviewed and negative with the exception of these symptoms as listed below:   Review of Systems  Neurological:       Reports more numbness in legs.     Objective:  Neurologic Exam  Physical Exam Physical Examination:   Filed Vitals:   11/15/14 1608  BP: 132/88  Pulse: 78  Resp: 16   General Examination: The patient is a very pleasant 81  y.o. female in no acute distress. She is obese. She appears frail and deconditioned. She is able to provide very little of her own her history today. She is less quiet today and more  interactive.   HEENT: Normocephalic, atraumatic, pupils are equal, round and reactive to light and accommodation. Extraocular tracking shows moderate saccadic breakdown without nystagmus noted. There is limitation to upper gaze. There is mild decrease in eye blink rate. Hearing is impaired mildly. Face is symmetric with moderate facial masking and normal facial sensation. There is a mild lower lip and jaw tremor. Neck is moderately rigid with intact passive ROM. There are no carotid bruits on auscultation. Oropharynx exam reveals moderate mouth dryness. There is significant airway crowding noted d/t redundant soft palate and large tongue. She is edentulous. Mallampati is class III. Tongue protrudes centrally and palate elevates symmetrically. There is mild drooling.   Chest: is clear to auscultation without wheezing, rhonchi or crackles noted.  Heart: sounds are regular and normal without murmurs, rubs or gallops noted.   Abdomen: is soft, non-tender and non-distended with normal bowel sounds appreciated on auscultation.  Extremities: There is 1+ pitting edema in the distal lower extremities bilaterally. Pedal pulses are intact. Chronic stasis-like changes are noted in the distal legs bilaterally with s/p skin grafting on the right. There are no varicose veins - all unchanged from before.    Skin: is warm and dry with no trophic changes noted. Age-related changes are noted on the skin.   Musculoskeletal: exam reveals no obvious joint deformities, tenderness, joint swelling or erythema.  Neurologically:  Mental status: The patient is awake and alert, paying fairly good attention. She is able to partially provide the history. Her daughter provides most of the history. She is oriented to: person, place, time/date and situation. Her memory, attention, language and knowledge are impaired mildly. There is no aphasia, agnosia, apraxia or anomia. There is a moderate degree of bradyphrenia. Speech is  moderately to severely hypophonic with mild dysarthria noted, however speech is edentulous as well. Mood is congruent and affect is somewhat blunted today.     On 12/07/2013: MMSE 17/30, CDT: 3/4, AFT: 5/min  On 06/28/2014: MMSE: 15/30, CDT: 2/4, AFT: 11/min.   Cranial nerves are as described above under HEENT exam. In addition, shoulder shrug is normal with equal shoulder height noted.  Motor exam: Normal bulk, and strength for age is noted. There are no dyskinesias noted.  Tone is mildly rigid with presence of cogwheeling in the right>left upper extremity and in both LEs. There is overall moderate bradykinesia. There is no drift or rebound.  There is an intermittent mild to moderate UE resting tremor on the right and a mild resting tremor in the left upper extremity.  Romberg is not tested as she is unstable without holding on.   Reflexes are 1+ in the upper extremities and trace in the knees and absent in the ankles.   Fine motor skills exam: Finger taps are moderately impaired on the right and mild to moderately impaired on the left. Hand movements are moderately impaired on the right and mild to moderately impaired on the left. RAP (rapid alternating patting) is moderately impaired on the right and mildly impaired on the left. Foot taps are moderate to severely impaired on the right and moderately impaired on the left. Foot agility (in the form of heel stomping) is moderately impaired on the right and moderately impaired on the left.    Cerebellar testing shows no dysmetria or  intention tremor on finger to nose testing. Heel to shin is not possible for her. There is no truncal or gait ataxia.   Sensory exam is intact to light touch in the upper and lower extremities.   Gait, station and balance: She stands up from the seated position with significant difficulty and needs assistance to stand. She has a moderately stooped posture and lean slightly to the right. She is able to maneuver her rolling  walker fairly well. She has no freezing. Balance is impaired. She walks with decreased stride length and pace but otherwise no festination.    Assessment and Plan:   In summary, Grae Leathers is a very pleasant 81 year old female with an underlying medical history of spinal stenosis, hypothyroidism, osteoarthritis, hypertension, depression, obesity, necrotizing fasciitis of her right leg, history of cellulitis of her jaw, who presents for FU consultation of her advanced, right sided predominant Parkinson's, complicated by hallucinations, anxiety, depression, insomnia and RBD, lower extremity swelling and overall deconditioning and frailty secondary to advancing age and her advancing PD. Today we mutually agreed to continue her current medications, but I would like to increase her gabapentin to 200 mg each night in the hope to help her low back pain and help her sleep a little better. They did not try the BuSpar as she did not have any more anxiety spells. Her daughter had it filled and has it available if needed. Previous workup included EEG and brain MRI and MRA from August 2015. She has had memory loss.  I prescribed a lift chair last time. Her daughter [name] has FMLA  in order to bring her to her appointments and for emergencies when she may have to leave work ad hoc. We will continue with Stalevo 4 times a day, Lexapro 10 mg daily, and Sinemet CR at bedtime. She is advised to use her walker at all times. In the past, she was on Ambien, amitriptyline, amantadine, clonazepam, and Seroquel at different times with intolerance to these medications. An increase in Stalevo in the past resulted in increase in hallucinations. I provided her with a new Rx for gabapentin today. I will see her back in 4 months, sooner if the need arises. I answered all their questions today and the patient and her daughter were in agreement.   I spent 25 minutes in total face-to-face time with the patient, more than 50% of which was  spent in counseling and coordination of care, reviewing test results, reviewing medication and discussing or reviewing the diagnosis of PD, its prognosis and treatment options.

## 2014-12-08 ENCOUNTER — Other Ambulatory Visit: Payer: Self-pay | Admitting: Neurology

## 2014-12-18 ENCOUNTER — Other Ambulatory Visit: Payer: Self-pay | Admitting: Internal Medicine

## 2014-12-18 ENCOUNTER — Ambulatory Visit (INDEPENDENT_AMBULATORY_CARE_PROVIDER_SITE_OTHER): Payer: Medicare Other | Admitting: Internal Medicine

## 2014-12-18 ENCOUNTER — Encounter: Payer: Self-pay | Admitting: Internal Medicine

## 2014-12-18 VITALS — BP 140/90 | HR 66 | Temp 98.2°F | Resp 20 | Ht 65.0 in | Wt 214.2 lb

## 2014-12-18 DIAGNOSIS — G20 Parkinson's disease: Secondary | ICD-10-CM

## 2014-12-18 DIAGNOSIS — Z23 Encounter for immunization: Secondary | ICD-10-CM

## 2014-12-18 DIAGNOSIS — E039 Hypothyroidism, unspecified: Secondary | ICD-10-CM | POA: Diagnosis not present

## 2014-12-18 DIAGNOSIS — I5032 Chronic diastolic (congestive) heart failure: Secondary | ICD-10-CM | POA: Diagnosis not present

## 2014-12-18 DIAGNOSIS — I10 Essential (primary) hypertension: Secondary | ICD-10-CM

## 2014-12-18 DIAGNOSIS — R739 Hyperglycemia, unspecified: Secondary | ICD-10-CM | POA: Diagnosis not present

## 2014-12-18 DIAGNOSIS — D751 Secondary polycythemia: Secondary | ICD-10-CM

## 2014-12-18 DIAGNOSIS — F0280 Dementia in other diseases classified elsewhere without behavioral disturbance: Secondary | ICD-10-CM

## 2014-12-18 DIAGNOSIS — G20A1 Parkinson's disease without dyskinesia, without mention of fluctuations: Secondary | ICD-10-CM

## 2014-12-18 NOTE — Progress Notes (Signed)
Patient ID: Andrea Dorsey, female   DOB: 07/09/1929, 81 y.o.   MRN: [id number]   Location: [place] Provider: Rexene Edison. [name], D.O., C.M.D.  Code Status: DNR Goals of Care: Advanced Directive information Does patient have an advance directive?: Yes  Chief Complaint  Patient presents with  . Medical Management of Chronic Issues    3 month follow-up for Hypertension, CHF, Hypothroidism  . Immunizations    flu shot today plus prevnar 13    HPI: Patient is a 81 y.o. female seen in the office today for med mgt of chronic diseases including her Parkinson's, dementia, htn, chf, and hypothyroidism.  She is due for her flu shot and prevnar 13.    Her daughter says things are about the same.   Parkinson's:  Jacelyn Grip notices a little more if routine altered a little or she has some anxiety also.  Meds kept the same at neurology due to side effects.  No hallucinations lately.   Urinary frequency comes and goes.  May go 5x in a row and then the next day will be normal.   HTN:  bp 140/90 today which is good for her.   CHF:  Edema is good.  She has lasix as needed.  She has not needed it.  Weight gradually increased, but not edema.   Insomnia:  Still not sleeping well.  No change.  Some nights ok.   Neuropathy:  Gabapentin was increased by neurology for heaviness in legs.   Hypothyroidism:  Taking her synthroid 24mcg daily.   Polycythemia:  They canceled the appt b/c they didn't understand what it was for when our office called them.  Opted for watchful waiting and if labs continue to trend up, agrees to hematology referral due to her "circulation problems" as her daughter refers to it. She has caregivers there during the week.  More help is really needed.  Her daughter is working 40 hrs per week.  She asks about medicare covering caregivers which of course they don't.  Review of Systems:  Review of Systems  Constitutional: Positive for malaise/fatigue. Negative for fever, chills  and weight loss.  HENT: Positive for hearing loss. Negative for congestion.   Respiratory: Negative for cough and shortness of breath.   Cardiovascular: Negative for chest pain.  Gastrointestinal: Positive for constipation. Negative for abdominal pain, blood in stool and melena.       Under control  Genitourinary: Positive for urgency and frequency. Negative for dysuria.       Incontinent  Musculoskeletal: Positive for joint pain. Negative for falls.       Knees  Skin: Negative for rash.  Neurological: Positive for tingling, tremors and weakness. Negative for loss of consciousness.  Psychiatric/Behavioral: Positive for depression and memory loss.    Past Medical History  Diagnosis Date  . Benign essential hypertension   . Hypothyroidism   . Osteoarthritis, generalized   . Parkinson disease ([place])   . Spinal stenosis   . History of necrotizing fasciitis     left leg, s/p debridement and graft  . Dementia in Parkinson's disease ([place])   . Depression     Past Surgical History  Procedure Laterality Date  . Skin debridement  2014    Brambhelt, MD  . Skin graft  2014    Brambhelt MD  . Spine surgery  2006    spinal stenosis  . Abdominal hysterectomy  1977    Allergies  Allergen Reactions  . Ivp Dye [Iodinated Diagnostic Agents]  Only when intravenous, not on external skin.  . Clindamycin Rash    Unclear whether patient has an actual allergy to clindamycin. On beta-lactam at the same time.      Medication List       This list is accurate as of: 12/18/14  3:22 PM.  Always use your most recent med list.               acetaminophen 500 MG tablet  Commonly known as:  TYLENOL  Take 500 mg by mouth every 6 (six) hours as needed for mild pain.     aspirin EC 81 MG tablet  Take 81 mg by mouth daily.     carbidopa-levodopa 50-200 MG tablet  Commonly known as:  SINEMET CR  TAKE 1 TABLET BY MOUTH AT BEDTIME     carbidopa-levodopa 25-100 MG disintegrating tablet    Commonly known as:  PARCOPA  TAKE 1 PILL UNDER THE TONGUE UP TO TWICE A DAY     carbidopa-levodopa-entacapone 37.5-150-200 MG tablet  Commonly known as:  STALEVO  Take 1 tablet by mouth 4 (four) times daily.     escitalopram 10 MG tablet  Commonly known as:  LEXAPRO  Take 1 tablet (10 mg total) by mouth daily.     escitalopram 10 MG tablet  Commonly known as:  LEXAPRO  TAKE 1 TABLET (10 MG TOTAL) BY MOUTH DAILY.     furosemide 20 MG tablet  Commonly known as:  LASIX  Take 20 mg by mouth. As needed or if weight gain is more than 3 lbs in one day     gabapentin 100 MG capsule  Commonly known as:  NEURONTIN  Take 2 capsules (200 mg total) by mouth at bedtime.     hydrALAZINE 25 MG tablet  Commonly known as:  APRESOLINE  TAKE 1 BY MOUTH 3 TIMES DAILY     levothyroxine 25 MCG tablet  Commonly known as:  SYNTHROID, LEVOTHROID  Take one tablet by mouth 30 minutes before breakfast once daily for thyroid     metoprolol succinate 25 MG 24 hr tablet  Commonly known as:  TOPROL-XL  Take 2 tablets (50 mg total) by mouth 2 (two) times daily.     OVER THE COUNTER MEDICATION  OTC stool softener prn     Tdap 5-2.5-18.5 LF-MCG/0.5 injection  Commonly known as:  BOOSTRIX  Inject 0.5 mLs into the muscle once.     Vitamin D3 2000 UNITS Tabs  Take 2,000 Units by mouth daily.     zoster vaccine live (PF) 19400 UNT/0.65ML injection  Commonly known as:  ZOSTAVAX  Inject 19,400 Units into the skin once.        Health Maintenance  Topic Date Due  . TETANUS/TDAP  02/28/1950  . ZOSTAVAX  03/01/1991  . DEXA SCAN  02/29/1996  . INFLUENZA VACCINE  08/21/2015  . PNA vac Low Risk Adult  Completed    Physical Exam: Filed Vitals:   12/18/14 1448  BP: 140/90  Pulse: 66  Temp: 98.2 F (36.8 C)  TempSrc: Oral  Resp: 20  Height: 5\' 5"  (1.651 m)  Weight: 214 lb 3.2 oz (97.16 kg)  SpO2: 97%   Body mass index is 35.64 kg/(m^2). Physical Exam  Constitutional: She appears  well-developed and well-nourished. No distress.  HENT:  Head: Normocephalic and atraumatic.  Cardiovascular: Intact distal pulses.   Murmur heard. irreg irreg  Pulmonary/Chest: Effort normal and breath sounds normal. She has no rales.  Abdominal: Soft. Bowel sounds  are normal. She exhibits no distension. There is no tenderness.  Musculoskeletal: Normal range of motion.  Neurological: She is alert. She exhibits abnormal muscle tone.  Oriented to person and general place (doctor's office), not time  Skin: Skin is warm and dry.  Skin graft left leg from prior necrotizing fasciitis  Psychiatric:  Flat affect    Labs reviewed: Basic Metabolic Panel:  Recent Labs  02/20/14 1213 07/03/14 1628  NA 144 143  K 5.2 4.6  CL 107 105  CO2 24 25  GLUCOSE 95 110*  BUN 16 21  CREATININE 1.13* 1.17*  CALCIUM 10.4* 10.1  TSH  --  3.340   Liver Function Tests:  Recent Labs  07/03/14 1628  AST 10  ALT 4  ALKPHOS 91  BILITOT 0.5  PROT 6.1  ALBUMIN 4.0   No results for input(s): LIPASE, AMYLASE in the last 8760 hours. No results for input(s): AMMONIA in the last 8760 hours. CBC:  Recent Labs  07/03/14 1628  WBC 5.4  NEUTROABS 2.9  HCT 48.8*   Lipid Panel: No results for input(s): CHOL, HDL, LDLCALC, TRIG, CHOLHDL, LDLDIRECT in the last 8760 hours. Lab Results  Component Value Date   HGBA1C 6.0* 07/03/2014   Assessment/Plan 1. Benign essential hypertension - BP at upper limits of normal, but she has a h/o orthostatic hypotension so cannot tolerate more medications without becoming symptomatic - Basic metabolic panel  2. Chronic diastolic CHF (congestive heart failure) (HCC) - has been stable and has not needed lasix in months for increased weight--is eating well though and has gained 3.2 lbs  - Basic metabolic panel  3. Hypothyroidism, unspecified hypothyroidism type - continues on synthroid, f/u tsh - Basic metabolic panel - TSH  4. Parkinson disease  ([place]) -continues to follow with [name] and taking sinemet and stalevo -tremors slightly worse, but, due to her h/o hallucinations at higher doses, these meds were kept the same at her last visit -gabapentin was increased at the last appt for neuropathic pain and rest  5. Dementia due to Parkinson's disease without behavioral disturbance ([place]) -does not seem this has really changed much recently--will see at next annual exam with mmse  6. Polycythemia - h/h has been trending upward, but pt without overt symptoms--takes a baby asa daily - discussed with her daughter and, if h/h continue upward trend with today's labs, they are agreeable to seeing the hematologist for further evaluation--she does not want invasive testing of any sort, but they agree to basic labs and consult -she is not a smoker - CBC with Differential/Platelet  7. Hyperglycemia - cont dietary modifications and monitor -does not exercise - Hemoglobin A1c  8. Need for vaccination with 13-polyvalent pneumococcal conjugate vaccine - Pneumococcal conjugate vaccine 13-valent IM given  9. Need for prophylactic vaccination and inoculation against influenza - Flu Vaccine QUAD 36+ mos PF IM (Fluarix & Fluzone Quad PF) given  Labs/tests ordered:   Orders Placed This Encounter  Procedures  . Flu Vaccine QUAD 36+ mos PF IM (Fluarix & Fluzone Quad PF)  . Pneumococcal conjugate vaccine 13-valent IM  . CBC with Differential/Platelet  . Basic metabolic panel  . Hemoglobin A1c  . TSH    Next appt:  03/22/2015 med mgt   Zaakirah Kistner L. Kinsler Soeder, D.O. [facility] [address], [place] [address] Cell Phone (Mon-Fri 8am-5pm):  [phone] On Call:  [phone] & follow prompts after 5pm & weekends Office Phone:  [phone] Office Fax:  [phone]          

## 2014-12-19 ENCOUNTER — Other Ambulatory Visit: Payer: Self-pay | Admitting: Internal Medicine

## 2014-12-19 ENCOUNTER — Other Ambulatory Visit: Payer: Self-pay

## 2014-12-19 DIAGNOSIS — D751 Secondary polycythemia: Secondary | ICD-10-CM

## 2014-12-19 LAB — CBC WITH DIFFERENTIAL/PLATELET
Basophils Absolute: 0 10*3/uL (ref 0.0–0.2)
Basos: 1 %
EOS (ABSOLUTE): 0.2 10*3/uL (ref 0.0–0.4)
Eos: 3 %
Hematocrit: 51.3 % — ABNORMAL HIGH (ref 34.0–46.6)
Hemoglobin: 17.3 g/dL — ABNORMAL HIGH (ref 11.1–15.9)
Immature Grans (Abs): 0 10*3/uL (ref 0.0–0.1)
Immature Granulocytes: 0 %
Lymphocytes Absolute: 1.7 10*3/uL (ref 0.7–3.1)
Lymphs: 26 %
MCH: 31.1 pg (ref 26.6–33.0)
MCHC: 33.7 g/dL (ref 31.5–35.7)
MCV: 92 fL (ref 79–97)
Monocytes Absolute: 0.8 10*3/uL (ref 0.1–0.9)
Monocytes: 12 %
Neutrophils Absolute: 3.9 10*3/uL (ref 1.4–7.0)
Neutrophils: 58 %
Platelets: 169 10*3/uL (ref 150–379)
RBC: 5.56 x10E6/uL — ABNORMAL HIGH (ref 3.77–5.28)
RDW: 14 % (ref 12.3–15.4)
WBC: 6.6 10*3/uL (ref 3.4–10.8)

## 2014-12-19 LAB — BASIC METABOLIC PANEL
BUN/Creatinine Ratio: 19 (ref 11–26)
BUN: 20 mg/dL (ref 8–27)
CO2: 24 mmol/L (ref 18–29)
Calcium: 9.8 mg/dL (ref 8.7–10.3)
Chloride: 106 mmol/L (ref 97–106)
Creatinine, Ser: 1.06 mg/dL — ABNORMAL HIGH (ref 0.57–1.00)
GFR calc Af Amer: 56 mL/min/{1.73_m2} — ABNORMAL LOW (ref >59)
GFR calc non Af Amer: 49 mL/min/{1.73_m2} — ABNORMAL LOW (ref >59)
Glucose: 113 mg/dL — ABNORMAL HIGH (ref 65–99)
Potassium: 4.3 mmol/L (ref 3.5–5.2)
Sodium: 146 mmol/L — ABNORMAL HIGH (ref 136–144)

## 2014-12-19 LAB — HEMOGLOBIN A1C
Est. average glucose Bld gHb Est-mCnc: 120 mg/dL
Hgb A1c MFr Bld: 5.8 % — ABNORMAL HIGH (ref 4.8–5.6)

## 2014-12-19 LAB — TSH: TSH: 2.88 u[IU]/mL (ref 0.450–4.500)

## 2014-12-20 ENCOUNTER — Other Ambulatory Visit: Payer: Self-pay

## 2014-12-20 ENCOUNTER — Other Ambulatory Visit: Payer: Self-pay | Admitting: *Deleted

## 2014-12-20 MED ORDER — HYDRALAZINE HCL 25 MG PO TABS: ORAL_TABLET | ORAL | Status: DC

## 2014-12-20 MED ORDER — CARBIDOPA-LEVODOPA ER 50-200 MG PO TBCR: 1.0000 | EXTENDED_RELEASE_TABLET | Freq: Every day | ORAL | Status: DC

## 2014-12-20 NOTE — Telephone Encounter (Signed)
Patient caregiver requested a Rx from local pharmacy until they can receive the mail order.

## 2014-12-21 ENCOUNTER — Telehealth: Payer: Self-pay | Admitting: Oncology

## 2014-12-21 NOTE — Telephone Encounter (Signed)
SPOKE TO PT'S DAUGHTER WHO CONFIRMED  PT NEW PT REFERRAL APPT. MAILED OUT NEW PT PACKET

## 2015-01-09 ENCOUNTER — Telehealth: Payer: Self-pay | Admitting: Oncology

## 2015-01-09 ENCOUNTER — Ambulatory Visit (HOSPITAL_BASED_OUTPATIENT_CLINIC_OR_DEPARTMENT_OTHER): Payer: Medicare Other | Admitting: Oncology

## 2015-01-09 VITALS — BP 194/79 | HR 60 | Temp 97.9°F | Resp 18 | Ht 65.0 in | Wt 213.9 lb

## 2015-01-09 DIAGNOSIS — D45 Polycythemia vera: Secondary | ICD-10-CM

## 2015-01-09 NOTE — Progress Notes (Signed)
Please see consult note.  

## 2015-01-09 NOTE — Consult Note (Signed)
Reason for Referral: Polycythemia.   HPI: 81 year old woman currently of [place] where she had been living for a while. She is a pleasant woman with history of Parkinson's disease and dementia. She is living independently but have a home help that assists her in her care also her daughter is involved in her care extensively. She is limited in her mobility because of osteoarthritis in Parkinson's but does use a walker inside her home. She was noted on a routine CBC on 12/18/2014 to have an elevated hemoglobin of 17.3. Her white cell count and platelet count was within normal range. She did have an elevated hemoglobin in the past back in February 2015 her hemoglobin was 15.7. She also has history of congestive heart failure and have been on Lasix in the past but recently discontinued. She denied any hormonal exposure or any hormone supplements. No major changes in her medication recently. She denied any lung pathology or smoking history. He denied any recent thrombosis including strokes or myocardial infarctions. He is currently on low-dose aspirin.  He does not report any headaches, blurry vision, syncope or seizures. She does report peripheral neuropathy. She does not report any fevers or chills or sweats. Does not report any chest pain or palpitation. Does not report any cough or hemoptysis or wheezing. Is not reporting nausea, vomiting or abdominal pain. Appetite is excellent without any progressive weight loss. She does not report any hematuria or dysuria. Does not report any skeletal complaints. Remaining review of systems unremarkable.   Past Medical History  Diagnosis Date  . Benign essential hypertension   . Hypothyroidism   . Osteoarthritis, generalized   . Parkinson disease ([place])   . Spinal stenosis   . History of necrotizing fasciitis     left leg, s/p debridement and graft  . Dementia in Parkinson's disease ([place])   . Depression   :  Past Surgical History  Procedure Laterality Date   . Skin debridement  2014    Brambhelt, MD  . Skin graft  2014    Brambhelt MD  . Spine surgery  2006    spinal stenosis  . Abdominal hysterectomy  1977  :   Current outpatient prescriptions:  .  acetaminophen (TYLENOL) 500 MG tablet, Take 500 mg by mouth every 6 (six) hours as needed for mild pain. , Disp: , Rfl:  .  aspirin EC 81 MG tablet, Take 81 mg by mouth daily., Disp: , Rfl:  .  carbidopa-levodopa (PARCOPA) 25-100 MG per disintegrating tablet, TAKE 1 PILL UNDER THE TONGUE UP TO TWICE A DAY, Disp: 60 tablet, Rfl: 3 .  carbidopa-levodopa (SINEMET CR) 50-200 MG tablet, Take 1 tablet by mouth at bedtime., Disp: 90 tablet, Rfl: 3 .  carbidopa-levodopa-entacapone (STALEVO) 37.5-150-200 MG per tablet, Take 1 tablet by mouth 4 (four) times daily., Disp: 360 tablet, Rfl: 3 .  Cholecalciferol (VITAMIN D3) 2000 UNITS TABS, Take 2,000 Units by mouth daily. , Disp: , Rfl:  .  escitalopram (LEXAPRO) 10 MG tablet, Take 1 tablet (10 mg total) by mouth daily., Disp: 90 tablet, Rfl: 0 .  escitalopram (LEXAPRO) 10 MG tablet, TAKE 1 TABLET (10 MG TOTAL) BY MOUTH DAILY., Disp: 30 tablet, Rfl: 5 .  furosemide (LASIX) 20 MG tablet, Take 20 mg by mouth. As needed or if weight gain is more than 3 lbs in one day, Disp: , Rfl:  .  gabapentin (NEURONTIN) 100 MG capsule, Take 2 capsules (200 mg total) by mouth at bedtime., Disp: 180 capsule, Rfl: 3 .  hydrALAZINE (APRESOLINE) 25 MG tablet, TAKE 1 TABLET BY MOUTH 3 TIMES A DAY, Disp: 90 tablet, Rfl: 3 .  hydrALAZINE (APRESOLINE) 25 MG tablet, Take one tablet by mouth three times daily, Disp: 90 tablet, Rfl: 0 .  levothyroxine (SYNTHROID, LEVOTHROID) 25 MCG tablet, TAKE 1 BY MOUTH DAILY 30 MINUTES BEFORE BREAKFAST FOR THYROID, Disp: 90 tablet, Rfl: 0 .  metoprolol succinate (TOPROL-XL) 25 MG 24 hr tablet, Take 2 tablets (50 mg total) by mouth 2 (two) times daily., Disp: 120 tablet, Rfl: 0 .  OVER THE COUNTER MEDICATION, OTC stool softener prn, Disp: , Rfl:  .   Tdap (BOOSTRIX) 5-2.5-18.5 LF-MCG/0.5 injection, Inject 0.5 mLs into the muscle once., Disp: 0.5 mL, Rfl: 0 .  zoster vaccine live, PF, (ZOSTAVAX) 16109 UNT/0.65ML injection, Inject 19,400 Units into the skin once., Disp: 1 each, Rfl: 0:  Allergies  Allergen Reactions  . Piperacillin-Tazobactam In Dex Rash    Unclear whether associated with clindamycin or zosyn, but probably more likely to be zosyn.   [name] Dye [Iodinated Diagnostic Agents]     Only when intravenous, not on external skin.  . Clindamycin Rash    Unclear whether patient has an actual allergy to clindamycin. On beta-lactam at the same time.  :  Family History  Problem Relation Age of Onset  . Heart disease Mother   . Heart disease Sister   . Hypertension Sister   . Stroke Sister   . Heart disease Sister     heart attack  . Cancer Sister     colon  :  Social History   Social History  . Marital Status: Widowed    Spouse Name: N/A  . Number of Children: 2  . Years of Education: 12   Occupational History  .      retired    Social History Main Topics  . Smoking status: Never Smoker   . Smokeless tobacco: Never Used  . Alcohol Use: No  . Drug Use: No  . Sexual Activity: Not Currently   Other Topics Concern  . Not on file   Social History Narrative   Patient lives in assisted living.patient is right handed  :  Pertinent items are noted in HPI.  Exam: Blood pressure 194/79, pulse 60, temperature 97.9 F (36.6 C), temperature source Oral, resp. rate 18, height 5\' 5"  (1.651 m), weight 213 lb 14.4 oz (97.024 kg), SpO2 98 %.  ECOG 2 General appearance: alert and cooperative. Appeared without distress. Head: Normocephalic, without obvious abnormality Throat: lips, mucosa, and tongue normal; teeth and gums normal oral ulcers or lesions. Neck: no adenopathy Resp: clear to auscultation bilaterally no wheezes or dullness to percussion. Cardio: regular rate and rhythm, S1, S2 normal, no murmur, click, rub or  gallop GI: soft, non-tender; bowel sounds normal; no masses,  no organomegaly condolences ascites. Extremities: extremities normal, atraumatic, no cyanosis or edema. Skin graft noted in her right lower extremity. Pulses: 2+ and symmetric Skin: Skin color, texture, turgor normal. No rashes or lesions Lymph nodes: Cervical, supraclavicular, and axillary nodes normal.  CBC    Component Value Date/Time   WBC 6.6 12/18/2014 1543   WBC 5.1 09/03/2013 0550   RBC 5.56* 12/18/2014 1543   RBC 5.09 09/03/2013 0550   HGB 16.2* 10/27/2013 0933   HCT 51.3* 12/18/2014 1543   HCT 48.3* 10/27/2013 0933   PLT 171 10/27/2013 0933   MCV 88 10/27/2013 0933   MCH 31.1 12/18/2014 1543   MCH 28.3 09/03/2013 0550  MCHC 33.7 12/18/2014 1543   MCHC 33.0 09/03/2013 0550   RDW 14.0 12/18/2014 1543   RDW 14.8 09/03/2013 0550   LYMPHSABS 1.7 12/18/2014 1543   LYMPHSABS 1.5 09/01/2013 0530   MONOABS 0.7 09/01/2013 0530   EOSABS 0.1 10/27/2013 0933   EOSABS 0.3 09/01/2013 0530   BASOSABS 0.0 12/18/2014 1543   BASOSABS 0.1 09/01/2013 0530      Assessment and Plan:   81 year old woman with the following issues:  1. Polycythemia: She presented with a hemoglobin of 17.3 with a normal white cell count and platelet count. The differential diagnosis was discussed with the patient and her daughter. Secondary causes for polycythemia were reviewed which includes hypoxia, dehydration from a diuretic, hormone supplements, erythropoietin secreting tumor as a paraneoplastic syndrome or medication.  Primary causes such as a polycythemia vera as a myeloproliferative disorder as a possibility but unlikely given the normal white cell count and platelet count. Further investigation for this disorder can be initiated in the future if the hemoglobin continues to rise.  From a management standpoint, her hemoglobin is mildly elevated and does not require any further intervention. I do not think there is any need for phlebotomy  at this time. But certainly could be a possibility and her hemoglobin continues to rise. I recommended continued observation and surveillance for the time being.  2. Thrombosis prophylaxis: Her risk of thrombosis is low given the mild elevation in the hemoglobin. She is already on low-dose aspirin at this time which I have recommended to continue.  3. Follow-up: Will be in 4 months after repeat CBC at which is already scheduled by her primary care physician in March 2016.

## 2015-01-09 NOTE — Telephone Encounter (Signed)
Gave patient avs report and appointments for April  °

## 2015-01-29 ENCOUNTER — Other Ambulatory Visit: Payer: Self-pay | Admitting: Internal Medicine

## 2015-02-21 ENCOUNTER — Other Ambulatory Visit: Payer: Self-pay | Admitting: *Deleted

## 2015-02-21 DIAGNOSIS — I5032 Chronic diastolic (congestive) heart failure: Secondary | ICD-10-CM

## 2015-02-21 MED ORDER — METOPROLOL SUCCINATE ER 25 MG PO TB24: 50.0000 mg | ORAL_TABLET | Freq: Two times a day (BID) | ORAL | Status: DC

## 2015-02-21 NOTE — Telephone Encounter (Signed)
Patient daughter, [name] requested a 30 day supply until they can receive their mail order Rx.

## 2015-02-26 ENCOUNTER — Telehealth: Payer: Self-pay

## 2015-02-26 ENCOUNTER — Other Ambulatory Visit: Payer: Self-pay | Admitting: *Deleted

## 2015-02-26 MED ORDER — LEVOTHYROXINE SODIUM 25 MCG PO TABS: ORAL_TABLET | ORAL | Status: DC

## 2015-02-26 MED ORDER — ESCITALOPRAM OXALATE 10 MG PO TABS: 10.0000 mg | ORAL_TABLET | Freq: Every day | ORAL | Status: DC

## 2015-02-26 NOTE — Telephone Encounter (Signed)
[place]

## 2015-02-26 NOTE — Telephone Encounter (Signed)
Received fax requesting 90 day Rx.

## 2015-02-28 ENCOUNTER — Telehealth: Payer: Self-pay | Admitting: *Deleted

## 2015-02-28 ENCOUNTER — Telehealth: Payer: Self-pay | Admitting: Neurology

## 2015-02-28 NOTE — Telephone Encounter (Signed)
Received Tier Exception Form from [place] [facility] [place] #[phone] for Hydralazine 25mg  and Levothyroxine 34mcg. Filled out what I could and given to Dr. [name] to review and sign. To be faxed back to [place] [facility] [place] #[phone] ID: [id number]

## 2015-02-28 NOTE — Telephone Encounter (Signed)
I called back and spoke to [place]. Answered questions about this medication. She states that [facility] will call back with a decision later today.

## 2015-02-28 NOTE — Telephone Encounter (Signed)
I spoke to [place] and advised her that we received same letter and that we will be working on it. She asked that it be done within 72 hours otherwise we have to start approval process over and patient is almost out of medication.

## 2015-02-28 NOTE — Telephone Encounter (Signed)
I notified daughter, will wait for approval and send it to [facility] mail order

## 2015-02-28 NOTE — Telephone Encounter (Signed)
Mark with [facility] called back, medication has been approved 02/28/15-02/28/16. Will be sending letter in mail

## 2015-02-28 NOTE — Telephone Encounter (Signed)
Olin Hauser with [facility] is calling regarding clinical information needed for the patient for medication carbidopa-levodopa-entacapone (STALEVO) 37.5-150-200 MG per tablet. Olin Hauser says a request has been faxed to our office but she would like a call back.

## 2015-02-28 NOTE — Telephone Encounter (Signed)
pts daughter called and says that [employer] is requesting a letter from [name] to confirm why the medication carbidopa-levodopa-entacapone (STALEVO) 37.5-150-200 MG per tablet is medically necessary. It is currently a non- formulary medication. Daughter gave [employer] service # [phone] Member ID # [id number] RxBin# [phone] Rx Group # [place] [address]. Please call [name] [phone]

## 2015-03-01 ENCOUNTER — Telehealth: Payer: Self-pay | Admitting: *Deleted

## 2015-03-01 NOTE — Telephone Encounter (Signed)
Received a phone call from [place] [facility] [place] regarding medication levothyroxine and hydrazine has been approved for one year starting today. Letters will be sent regarding this matter.

## 2015-03-02 ENCOUNTER — Telehealth: Payer: Self-pay | Admitting: Neurology

## 2015-03-02 DIAGNOSIS — G20 Parkinson's disease: Secondary | ICD-10-CM

## 2015-03-02 NOTE — Telephone Encounter (Signed)
Pt's daughter called said carbidopa-levodopa-entacapone (STALEVO) 37.5-150-200 MG per tablet is now a tier 4 and will cost >$500 every 3 mths and carbidopa-levodopa (SINEMET CR) 50-200 MG tablet is a tier 4 also and cost >$200 every 3 mths. She said these medications are the only ones pt has been able to tolerate for parkinsons. She is filing an appeal with [employer]. She wants to know if there are any viable options at this time.

## 2015-03-02 NOTE — Telephone Encounter (Signed)
We can go with the generic forms, but I think she is already on the generic forms for these 2 meds? We may have to change to generic Sinemet and phase her out of the Stalevo and Sinemet CR. Pls call daughter to discuss.

## 2015-03-05 MED ORDER — CARBIDOPA-LEVODOPA 25-100 MG PO TABS: 1.5000 | ORAL_TABLET | Freq: Four times a day (QID) | ORAL | Status: DC

## 2015-03-05 MED ORDER — ENTACAPONE 200 MG PO TABS: 200.0000 mg | ORAL_TABLET | Freq: Four times a day (QID) | ORAL | Status: DC

## 2015-03-05 NOTE — Telephone Encounter (Signed)
Please call daughter regarding change in medication. She can continue with Sinemet CR at night once daily. We will stop the Stalevo 4 times a day. Instead, she will start Sinemet 25-100 milligrams strength 1-1/2 tablets 4 times a day and Comtan generic, 200 mg strength one pill 4 times a day. Prescriptions are placed for 30 day supply for her [employer] and we can send mail-order prescription once patient is established on this regimen for long-term prescription.

## 2015-03-05 NOTE — Telephone Encounter (Signed)
Patient's daughter is asking if a 1 month supply of Rx carbidopa-levodopa-entacapone(STALEVO) 37.5-150-200 MG per tablet could be called in to [facility], [place] until this issue with the copays is resolved.  Also, she is asking if there is an alternative medication that would be a lesser copay.  Please call.

## 2015-03-05 NOTE — Telephone Encounter (Signed)
I spoke to [place]. She is willing to change medications as needed. Patient can get Sinemet CR cheap for cash price through [email or web address] (daughter is ok with that). Stalevo is still too expensive. How would you like to proceed?

## 2015-03-06 NOTE — Telephone Encounter (Signed)
Left medication directions below on vm. I left call back number for further questions.

## 2015-03-21 ENCOUNTER — Other Ambulatory Visit: Payer: Self-pay | Admitting: Internal Medicine

## 2015-03-22 ENCOUNTER — Ambulatory Visit: Payer: Medicare Other | Admitting: Internal Medicine

## 2015-04-09 ENCOUNTER — Other Ambulatory Visit: Payer: Self-pay | Admitting: *Deleted

## 2015-04-09 MED ORDER — HYDRALAZINE HCL 25 MG PO TABS: ORAL_TABLET | ORAL | Status: DC

## 2015-04-09 NOTE — Telephone Encounter (Signed)
[facility] 

## 2015-04-11 ENCOUNTER — Other Ambulatory Visit: Payer: Self-pay | Admitting: *Deleted

## 2015-04-11 MED ORDER — HYDRALAZINE HCL 25 MG PO TABS: ORAL_TABLET | ORAL | Status: DC

## 2015-04-11 NOTE — Telephone Encounter (Signed)
[facility] 

## 2015-04-13 ENCOUNTER — Other Ambulatory Visit: Payer: Self-pay | Admitting: *Deleted

## 2015-04-16 ENCOUNTER — Encounter: Payer: Self-pay | Admitting: Internal Medicine

## 2015-04-16 ENCOUNTER — Ambulatory Visit (INDEPENDENT_AMBULATORY_CARE_PROVIDER_SITE_OTHER): Payer: Medicare Other | Admitting: Internal Medicine

## 2015-04-16 VITALS — BP 140/78 | HR 66 | Temp 97.8°F | Ht 65.0 in | Wt 216.0 lb

## 2015-04-16 DIAGNOSIS — E039 Hypothyroidism, unspecified: Secondary | ICD-10-CM

## 2015-04-16 DIAGNOSIS — D751 Secondary polycythemia: Secondary | ICD-10-CM | POA: Diagnosis not present

## 2015-04-16 DIAGNOSIS — F0280 Dementia in other diseases classified elsewhere without behavioral disturbance: Secondary | ICD-10-CM | POA: Diagnosis not present

## 2015-04-16 DIAGNOSIS — G20A1 Parkinson's disease without dyskinesia, without mention of fluctuations: Secondary | ICD-10-CM

## 2015-04-16 DIAGNOSIS — G20 Parkinson's disease: Secondary | ICD-10-CM | POA: Diagnosis not present

## 2015-04-16 DIAGNOSIS — I10 Essential (primary) hypertension: Secondary | ICD-10-CM

## 2015-04-16 DIAGNOSIS — R739 Hyperglycemia, unspecified: Secondary | ICD-10-CM

## 2015-04-16 NOTE — Progress Notes (Signed)
Patient ID: Andrea Dorsey, female   DOB: April 19, 1929, 82 y.o.   MRN: [id number]   Location:  [facility] clinic Provider:  Raseel Jans L. [name], D.O., C.M.D.  Code Status: DNR Goals of Care:  Advanced Directives 04/16/2015  Does patient have an advance directive? Yes  Type of Advance Directive [place]  Does patient want to make changes to advanced directive? -  Copy of advanced directive(s) in chart? Yes   Chief Complaint  Patient presents with  . Medical Management of Chronic Issues    3 mth follow-up    HPI: Patient is a 82 y.o. female seen today for medical management of chronic diseases.    [name] just changed her medications due to cost.  Off stalevo and on sinemet plus entacapone.  Numbness in legs less frequent.    No difficulty with swelling of ankles.  No shortness of breath walking in here.  Wt up 2 lbs.  Appetite good.  Sleep is about the same.    Bowels are moving daily and large amts.    BP improved today vs. December.  Given her h/o orthostasis, I'm happy with [id number] systolic.  No hallucinations.    Does have pain in knees and back.  Hasn't been c/o pain to use the tylenol  Gets heaviness of legs, does well when she uses the pedal cycle or her caregiver rubs her legs.  Gabapentin at hs.    Past Medical History  Diagnosis Date  . Benign essential hypertension   . Hypothyroidism   . Osteoarthritis, generalized   . Parkinson disease ([place])   . Spinal stenosis   . History of necrotizing fasciitis     left leg, s/p debridement and graft  . 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 Depression     Past Surgical History  Procedure Laterality Date  . Skin debridement  2014    Brambhelt, MD  . Skin graft  2014    Brambhelt MD  . Spine surgery  2006    spinal stenosis  . Abdominal hysterectomy  1977    Allergies  Allergen Reactions  . Piperacillin-Tazobactam In Dex Rash    Unclear whether associated with clindamycin or zosyn, but probably more  likely to be zosyn.   [name] Dye [Iodinated Diagnostic Agents]     Only when intravenous, not on external skin.  . Clindamycin Rash    Unclear whether patient has an actual allergy to clindamycin. On beta-lactam at the same time.      Medication List       This list is accurate as of: 04/16/15  3:33 PM.  Always use your most recent med list.               acetaminophen 500 MG tablet  Commonly known as:  TYLENOL  Take 500 mg by mouth every 6 (six) hours as needed for mild pain.     aspirin EC 81 MG tablet  Take 81 mg by mouth daily.     carbidopa-levodopa 25-100 MG tablet  Commonly known as:  SINEMET IR  Take 1.5 tablets by mouth 4 (four) times daily. In lieu of stalevo     entacapone 200 MG tablet  Commonly known as:  COMTAN  Take 1 tablet (200 mg total) by mouth 4 (four) times daily. In lieu of stalevo, take with Sinemet 4 times a day     escitalopram 10 MG tablet  Commonly known as:  LEXAPRO  Take 1 tablet (10  mg total) by mouth daily.     furosemide 20 MG tablet  Commonly known as:  LASIX  Take 20 mg by mouth. As needed or if weight gain is more than 3 lbs in one day     gabapentin 100 MG capsule  Commonly known as:  NEURONTIN  Take 2 capsules (200 mg total) by mouth at bedtime.     hydrALAZINE 25 MG tablet  Commonly known as:  APRESOLINE  Take one tablet by mouth three times daily     levothyroxine 25 MCG tablet  Commonly known as:  SYNTHROID, LEVOTHROID  Take one tablet by mouth once daily 30 minutes before breakfast for thyroid     metoprolol succinate 25 MG 24 hr tablet  Commonly known as:  TOPROL-XL  TAKE 2 TABLETS BY MOUTH TWICE DAILY     Vitamin D3 2000 units Tabs  Take 2,000 Units by mouth daily.       Review of Systems:  Review of Systems  Constitutional: Negative for fever and chills.  HENT: Negative for congestion.   Respiratory: Negative for shortness of breath.   Cardiovascular: Negative for chest pain and palpitations.    Gastrointestinal: Negative for constipation, blood in stool and melena.  Genitourinary: Negative for dysuria.  Musculoskeletal: Negative for falls.  Skin: Negative for rash.  Neurological: Positive for tremors and sensory change. Negative for loss of consciousness.  Psychiatric/Behavioral: Positive for memory loss. Negative for depression and hallucinations. The patient does not have insomnia.     Health Maintenance  Topic Date Due  . TETANUS/TDAP  02/28/1950  . ZOSTAVAX  03/01/1991  . DEXA SCAN  02/29/1996  . INFLUENZA VACCINE  08/21/2015  . PNA vac Low Risk Adult  Completed    Physical Exam: Filed Vitals:   04/16/15 1458  BP: 140/78  Pulse: 66  Temp: 97.8 F (36.6 C)  TempSrc: Oral  Height: 5\' 5"  (1.651 m)  Weight: 216 lb (97.977 kg)  SpO2: 97%   Body mass index is 35.94 kg/(m^2). Physical Exam  Constitutional: She appears well-developed and well-nourished. No distress.  HENT:  Head: Normocephalic and atraumatic.  Cardiovascular: Normal rate, regular rhythm, normal heart sounds and intact distal pulses.   Pulmonary/Chest: Effort normal and breath sounds normal. She has no rales.  Abdominal: Bowel sounds are normal.  Musculoskeletal:  Seated in wheelchair  Neurological: She is alert.  Resting tremor right greater than left  Skin: Skin is warm and dry.  Psychiatric:  Chronic flat affect    Labs reviewed: Basic Metabolic Panel:  Recent Labs  07/03/14 1628 12/18/14 1543  NA 143 146*  K 4.6 4.3  CL 105 106  CO2 25 24  GLUCOSE 110* 113*  BUN 21 20  CREATININE 1.17* 1.06*  CALCIUM 10.1 9.8  TSH 3.340 2.880   Liver Function Tests:  Recent Labs  07/03/14 1628  AST 10  ALT 4  ALKPHOS 91  BILITOT 0.5  PROT 6.1  ALBUMIN 4.0   No results for input(s): LIPASE, AMYLASE in the last 8760 hours. No results for input(s): AMMONIA in the last 8760 hours. CBC:  Recent Labs  07/03/14 1628 12/18/14 1543  WBC 5.4 6.6  NEUTROABS 2.9 3.9  HCT 48.8* 51.3*   MCV 91 92  PLT 161 169   Lipid Panel: No results for input(s): CHOL, HDL, LDLCALC, TRIG, CHOLHDL, LDLDIRECT in the last 8760 hours. Lab Results  Component Value Date   HGBA1C 5.8* 12/18/2014    Procedures since last visit: No results  found.  Assessment/Plan 1. Parkinson disease ([place]) -cont current sinemet and entacapone  -no notable change per pt and caregiver since change was instituted due to cost  2. Dementia due to Parkinson's disease without behavioral disturbance ([place]) -no recent hallucinations -seems this is stable  3. Hypothyroidism, unspecified hypothyroidism type -cont current synthroid -last tsh wnl  4. Benign essential hypertension -bp at goal for her with her orthostatic hypotension history--cont same meds  5. Hyperglycemia - cont to monitor, remains obese and immobile - Hemoglobin [id number] - Basic metabolic panel  6. Polycythemia -has f/u appt in April with hematology about blood counts  Labs/tests ordered:   Orders Placed This Encounter  Procedures  . Hemoglobin A1c  . Basic metabolic panel    Next appt:  3 mos for med mgt   Andrea Dorsey L. Daine Gunther, D.O. [facility] [address], [place] [address] Cell Phone (Mon-Fri 8am-5pm):  [phone] On Call:  [phone] & follow prompts after 5pm & weekends Office Phone:  [phone] Office Fax:  [phone]

## 2015-04-17 LAB — BASIC METABOLIC PANEL
BUN/Creatinine Ratio: 17 (ref 11–26)
BUN: 22 mg/dL (ref 8–27)
CO2: 24 mmol/L (ref 18–29)
Calcium: 9.7 mg/dL (ref 8.7–10.3)
Chloride: 106 mmol/L (ref 96–106)
Creatinine, Ser: 1.29 mg/dL — ABNORMAL HIGH (ref 0.57–1.00)
GFR calc Af Amer: 44 mL/min/{1.73_m2} — ABNORMAL LOW (ref >59)
GFR calc non Af Amer: 38 mL/min/{1.73_m2} — ABNORMAL LOW (ref >59)
Glucose: 112 mg/dL — ABNORMAL HIGH (ref 65–99)
Potassium: 4.5 mmol/L (ref 3.5–5.2)
Sodium: 145 mmol/L — ABNORMAL HIGH (ref 134–144)

## 2015-04-17 LAB — HEMOGLOBIN A1C
Est. average glucose Bld gHb Est-mCnc: 123 mg/dL
Hgb A1c MFr Bld: 5.9 % — ABNORMAL HIGH (ref 4.8–5.6)

## 2015-04-18 ENCOUNTER — Other Ambulatory Visit: Payer: Self-pay | Admitting: Internal Medicine

## 2015-05-07 ENCOUNTER — Telehealth: Payer: Self-pay

## 2015-05-07 DIAGNOSIS — G20 Parkinson's disease: Secondary | ICD-10-CM

## 2015-05-07 MED ORDER — CARBIDOPA-LEVODOPA ER 50-200 MG PO TBCR: 1.0000 | EXTENDED_RELEASE_TABLET | Freq: Every day | ORAL | Status: DC

## 2015-05-07 NOTE — Telephone Encounter (Signed)
Patient needed refill of C/L CR at bedtime. Fax received from [facility] order, refill sent

## 2015-05-08 ENCOUNTER — Encounter: Payer: Self-pay | Admitting: Neurology

## 2015-05-08 ENCOUNTER — Ambulatory Visit: Payer: Medicare Other | Admitting: Oncology

## 2015-05-08 ENCOUNTER — Ambulatory Visit (INDEPENDENT_AMBULATORY_CARE_PROVIDER_SITE_OTHER): Payer: Medicare Other | Admitting: Neurology

## 2015-05-08 VITALS — BP 168/86 | HR 68 | Resp 16 | Ht 65.0 in | Wt 215.0 lb

## 2015-05-08 DIAGNOSIS — G20 Parkinson's disease: Secondary | ICD-10-CM | POA: Diagnosis not present

## 2015-05-08 DIAGNOSIS — R413 Other amnesia: Secondary | ICD-10-CM

## 2015-05-08 MED ORDER — CARBIDOPA-LEVODOPA 25-100 MG PO TABS: ORAL_TABLET | ORAL | Status: DC

## 2015-05-08 NOTE — Progress Notes (Signed)
Subjective:    Patient ID: Andrea Dorsey is a 82 y.o. female.  HPI     Interim history:   Andrea Dorsey is a very pleasant 82 year old right-handed woman with an underlying complex medical history of spinal stenosis, necrotizing fasciitis, status post debridement and grafting on the right lower leg, lower extremity edema, insomnia, hypothyroidism, osteoarthritis, hypertension, depression, and obesity, who presents for followup consultation of her advanced, right-sided predominant Parkinson's disease, complicated by hallucinations, memory loss, mood disorder including anxiety and depression, sleep disorder including insomnia, OSA and RBD and advancing age. She is accompanied by her daughter today. I last saw her on 11/15/2014 at which time she had more numbness in her legs. She has more difficulty with bladder control. She had thankfully not fallen and anxiety was stable. She was using her walker. I suggested we continue with Stalevo 150 mg 4 times a day. She had problems with her doses and other medication intolerances in the past.  Today, 05/08/2015: She reports still having trouble sleeping at night, sometimes her legs feel heavy. Sometimes she has difficulty moving altogether. She has been using the Parcopa under the tongue usually once in the afternoon, sometimes twice in the afternoon. We had to change her Stalevo to generic Sinemet and entacapone secondary to cost. She is taking Sinemet 1-1/2 pills 4 times a day and entacapone 1 pill 4 times a day. They have not tried BuSpar. She has residual anxiety. She has been on Lexapro for this. She had no side effects but daughter feels that it has not helped very much either. She has not fallen. Appetite is good. She does not always drink enough water. She has caretakers during the day but daughter feels overwhelmed and has to help out a lot over the weekend 2 and works full-time as well.Daughter has noticed more freezing spells in the  afternoons.  Previously:  I saw her on 06/28/2014, at which time her daughter reported that things were fairly stable. Patient was reporting a cough at night. She was taking cough medicine daily. She reported postnasal drip and drooling at night. Her speech was softer. She was walking with a walker and thankfully had not fallen recently. Her appetite was good. She needed more assistance. She had a lift chair. She was on Stalevo 1 pill 4 times a day at 4 hourly intervals. She was on Sinemet CR at night. They never tried the BuSpar as the anxiety episodes or spells subsided. Her daughter suspected that these spells were in the context of a certain caretaker that cause stress to the patient. She was on Lexapro 10 mg daily. Her daughter requested FMLA paperwork so she could make it to the appointments and also be able to go home in an emergency situation.  I saw her on 03/16/2014, at which time her daughter reported ongoing issues with spells of decreased alertness. She would not lose full consciousness but became listless and limp. She would not follow commands. The duration would be minutes to maybe 2 hours. Checking blood pressure at this times showed that she had some high blood pressure at the time. There was no convulsion, no tongue bite, no full loss of consciousness, no loss of bowel or bladder control except for one time when she did wet herself. There was no time predilection. I suggested we continue with Lexapro at 10 mg and add low-dose BuSpar for anxiety. We have done workup in the form of brain scans and EEG in the recent past. She was no  longer on Parcopa as it did not help. She was not sleeping well. We kept the Sinemet CR the same at night.  I saw her on 12/07/2013, at which time I talked to the patient's daughter, [name], separately before seeing the patient. Her daughter was concerned about the patient's general decline. She had more dream enactments. The patient felt that she was getting worse  and that she could not sleep at night. She was dozing off during the day. She had not fallen thankfully. She could not tolerate clonazepam. She also had side effects on low-dose Seroquel. Melatonin at 10 mg at night did not seem to help. She was not drinking enough water. She had moved to a new home and had 3 caretakers taking turns and she was not without supervision. On the weekends her family will take turns supervising and helping. I increased her Lexapro and kept her other medications the same.   I saw her on 09/07/13, at which time her daughter reported no recent staring or zoning out spells. She was in the process of moving to a townhome with a caretaker for 4 days and 3 nights and family staying with her the rest of the time. I ordered an EEG. I also asked her to take Sinemet CR at night to get her through the night a little bit better. I started her on a low-dose Lexapro 5 mg strength. Her EEG on 09/22/2013 was reported as normal in the awake state. Her daughter requested a sooner appointment for more hallucinations and confusion. She had more memory loss.   I saw her on 06/08/2013, at which time I suggested she continue with Stalevo 150 4 times a day. She previously has had hallucinations with increased doses. I suggested adding a little extra levodopa in the form of Parcopa half a pill up to twice daily as needed. This was supposed to help with freezing. In the interim, about a month later on 07/13/2013 she was seen by Charlott Holler, NP for a sooner than scheduled appointment at which time the Parcopa dose was increased to one whole pill up to twice daily for rigidity and freezing. She presented to the emergency room on 07/15/2013 with generalized complaint of weakness and freezing and was advised to increase her Parcopa. She presented voice recently this month to the emergency room with altered sensorium, or staring spells. Workup with head CT and MRIs were negative. It was felt that these were related to  Parkinson's disease. She had a brain MRI and MRA without contrast on 09/03/2013:No acute intracranial abnormality. 2. Cerebral atrophy and single remote microhemorrhage in the left frontal lobe. 3. Unremarkable head MRA. In addition, have reviewed the images through the PACS system. She had head CT without contrast on 09/03/2013:No acute intracranial pathology seen on CT. 2. Inspissated mucus filling the left maxillary sinus, and mild partial opacification of the mastoid air cells bilaterally. She had head CT without contrast on 09/01/2013: No acute intracranial pathology seen on CT. 2. Mild cortical volume loss noted. 3. Mild partial opacification of the mastoid air cells bilaterally. In addition, reviewed the images through the PACS system.  I saw her on 02/03/13, at which time I increased her Stalevo to 5 times a day. We talked about potential side effects. I asked her to stop the Seroquel and started her on low-dose clonazepam for insomnia and RBD. In the interim she was seen by our nurse practitioner, Ms. Lam on 04/08/2013, at which time I also saw her, and we  mutually decided to decrease Stalevo back to 4 times a day because of worsening confusion and hallucinations. Her clonazepam was discontinued by her PCP because of side effects. I suggested a trial of melatonin, 5-10 mg. We checked some labs including CBC, CMP, CRP, ESR and urinalysis. Labs showed no significant abnormalities, mild but stable kidney impairment was noted. She was encouraged to drink more water. She has seen her PCP in April 2015 and was advised to take her fluid pill as needed, an increase of melatonin to 10 mg.   I first met her on 10/15/2012, at which time a continued her Stalevo. I suggested a small dose of Seroquel to help her sleep and tone down the REM behavior disorder and encouraged him to discuss with her primary care physician the addition of an antidepressant. She presents with a complaint of worsening tremors.   She has been  in [facility] [address] since 10/18/12. She has a Hx of vivid dreams and tends to act out in her sleep.   She previously used to see a neurologist at [facility], when she lived in [place], [place]. She was diagnosed with PD about 12 years ago when she was still residing in [place]. She needs assistance with her ADLs. She has been living with her daughter and son-in-law and they have looked into the possibility of a long-term care facility but the patient has been resistant. She has had problems at night including sundowning, confusion, inability to sleep.   Her symptoms started on one side with tremors, but the patient was not sure which side. She has been on Stalevo for the past 2 years, and prior to that she was on C/L, and prior to that she was on Amantadine. She may not have tried a dopamine agonist or rasagiline in the past. She has been experiencing nausea with her PD medications and still has occasional nausea. In March 2014 she developed necrotizing fasciitis and needed debridement and grafting. She developed hallucinations at the time, but was on pain medications at the time, but the [facility] persisted beyond that. She also started having memory loss then. She developed cellulitis with complications in her jaw and needed all remaining teeth removed and had IV antibiotics in mid-2014. She has no FHx of PD or dementia. She has no Hx of psychiatric premorbid illness. In 2006 she had back surgery. She has no exposure to chemicals, or agent orange. She has been an anxious person. She is not able to sleep at night. She was tried on Ambien and amitriptyline. She has difficulty with sleep onset and sleep maintenance.   She has occasional urinary incontinence, occasional constipation. She snores, and needed to have a sleep study, but did not go. She has had some dream enactments and has slid out of bed.   She had been very opposed to going into assisted living, but understood that she given her complex  medical history and multiple issues and advanced Parkinson's disease she was no longer safe to live by herself.    Her Past Medical History Is Significant For: Past Medical History  Diagnosis Date  . Benign essential hypertension   . Hypothyroidism   . Osteoarthritis, generalized   . Parkinson disease ([place])   . Spinal stenosis   . History of necrotizing fasciitis     left leg, s/p debridement and graft  . Dementia in Parkinson's disease ([place])   . Depression     Her Past Surgical History Is Significant  For: Past Surgical History  Procedure Laterality Date  . Skin debridement  2014    Brambhelt, MD  . Skin graft  2014    Brambhelt MD  . Spine surgery  2006    spinal stenosis  . Abdominal hysterectomy  1977    Her Family History Is Significant For: Family History  Problem Relation Age of Onset  . Heart disease Mother   . Heart disease Sister   . Hypertension Sister   . Stroke Sister   . Heart disease Sister     heart attack  . Cancer Sister     colon    Her Social History Is Significant For: Social History   Social History  . Marital Status: Widowed    Spouse Name: N/A  . Number of Children: 2  . Years of Education: 12   Occupational History  .      retired    Social History Main Topics  . Smoking status: Never Smoker   . Smokeless tobacco: Never Used  . Alcohol Use: No  . Drug Use: No  . Sexual Activity: Not Currently   Other Topics Concern  . None   Social History Narrative   Patient lives in assisted living.patient is right handed    Her Allergies Are:  Allergies  Allergen Reactions  . Piperacillin-Tazobactam In Dex Rash    Unclear whether associated with clindamycin or zosyn, but probably more likely to be zosyn.   [name] Dye [Iodinated Diagnostic Agents]     Only when intravenous, not on external skin.  . Clindamycin Rash    Unclear whether patient has an actual allergy to clindamycin. On beta-lactam at the same time.  :   Her Current  Medications Are:  Outpatient Encounter Prescriptions as of 05/08/2015  Medication Sig  . acetaminophen (TYLENOL) 500 MG tablet Take 500 mg by mouth every 6 (six) hours as needed for mild pain.   Marland Kitchen aspirin EC 81 MG tablet Take 81 mg by mouth daily.  . carbidopa-levodopa (SINEMET CR) 50-200 MG tablet Take 1 tablet by mouth at bedtime.  . carbidopa-levodopa (SINEMET IR) 25-100 MG tablet Take 1.5 tablets by mouth 4 (four) times daily. In lieu of stalevo  . Cholecalciferol (VITAMIN D3) 2000 UNITS TABS Take 2,000 Units by mouth daily.   . entacapone (COMTAN) 200 MG tablet Take 1 tablet (200 mg total) by mouth 4 (four) times daily. In lieu of stalevo, take with Sinemet 4 times a day  . escitalopram (LEXAPRO) 10 MG tablet Take 1 tablet (10 mg total) by mouth daily.  Marland Kitchen gabapentin (NEURONTIN) 100 MG capsule Take 2 capsules (200 mg total) by mouth at bedtime.  . hydrALAZINE (APRESOLINE) 25 MG tablet Take one tablet by mouth three times daily  . levothyroxine (SYNTHROID, LEVOTHROID) 25 MCG tablet Take one tablet by mouth once daily 30 minutes before breakfast for thyroid  . metoprolol succinate (TOPROL-XL) 25 MG 24 hr tablet TAKE 2 TABLETS BY MOUTH TWICE DAILY  . [DISCONTINUED] furosemide (LASIX) 20 MG tablet Take 20 mg by mouth. As needed or if weight gain is more than 3 lbs in one day   No facility-administered encounter medications on file as of 05/08/2015.  :  Review of Systems:  Out of a complete 14 point review of systems, all are reviewed and negative with the exception of these symptoms as listed below:   Review of Systems  Neurological:       Daughter reports that the patient seems to have  more freezing episodes. Daughter also states that patient seems to need reminders more often on how to do daily activities.     Objective:  Neurologic Exam  Physical Exam Physical Examination:   Filed Vitals:   05/08/15 1618  BP: 168/86  Pulse: 68  Resp: 16   General Examination: The patient is a  very pleasant 82 y.o. female in no acute distress. She is obese. She appears less deconditioned. She is able to provide very little of her own her history today. She is fairly interactive and well groomed.   HEENT: Normocephalic, atraumatic, pupils are equal, round and reactive to light and accommodation. Extraocular tracking shows moderate saccadic breakdown without nystagmus noted. There is limitation to upper gaze. There is mild decrease in eye blink rate. Hearing is impaired mildly. Face is symmetric with moderate facial masking and normal facial sensation. There is a mild lower lip and jaw tremor. Neck is moderately rigid with intact passive ROM. There are no carotid bruits on auscultation. Oropharynx exam reveals moderate mouth dryness. There is significant airway crowding noted d/t redundant soft palate and large tongue. She is edentulous. Mallampati is class III. Tongue protrudes centrally and palate elevates symmetrically. There is mild drooling.   Chest: is clear to auscultation without wheezing, rhonchi or crackles noted.  Heart: sounds are regular and normal without murmurs, rubs or gallops noted.   Abdomen: is soft, non-tender and non-distended with normal bowel sounds appreciated on auscultation.  Extremities: There is 1+ pitting edema in the distal lower extremities bilaterally. Pedal pulses are intact. Chronic stasis-like changes are noted in the distal legs bilaterally with s/p skin grafting on the right. There are no varicose veins - all unchanged from before.    Skin: is warm and dry with no trophic changes noted. Age-related changes are noted on the skin.   Musculoskeletal: exam reveals no obvious joint deformities, tenderness, joint swelling or erythema.  Neurologically:  Mental status: The patient is awake and alert, paying fair attention. She is able to partially provide the history. Her daughter provides most of the history. She is oriented to: person, place, time/date and  situation. Her memory, attention, language and knowledge are impaired mildly. There is no aphasia, agnosia, apraxia or anomia. There is a moderate degree of bradyphrenia. Speech is moderately to severely hypophonic with mild dysarthria noted, however speech is edentulous as well. Mood is congruent and affect is somewhat blunted today.     On 12/07/2013: MMSE 17/30, CDT: 3/4, AFT: 5/min  On 06/28/2014: MMSE: 15/30, CDT: 2/4, AFT: 11/min.   Cranial nerves are as described above under HEENT exam. In addition, shoulder shrug is normal with equal shoulder height noted.  Motor exam: Normal bulk, and strength for age is noted. There are no dyskinesias noted.  Tone is mildly rigid with presence of cogwheeling in the right>left upper extremity and in both LEs. There is overall moderate bradykinesia. There is no drift or rebound.  There is an intermittent mild to moderate UE resting tremor on the right and a mild resting tremor in the left upper extremity.  Romberg is not tested as she is unstable without holding on.   Reflexes are 1+ in the upper extremities and trace in the knees and absent in the ankles.   Fine motor skills exam: Finger taps are moderately impaired on the right and mild to moderately impaired on the left. Hand movements are moderately impaired on the right and mild to moderately impaired on the left. RAP (rapid alternating  patting) is moderately impaired on the right and mildly impaired on the left. Foot taps are moderate to severely impaired on the right and moderately impaired on the left. Foot agility (in the form of heel stomping) is moderately impaired on the right and moderately impaired on the left.    Cerebellar testing shows no dysmetria or intention tremor on finger to nose testing. Heel to shin is not possible for her. There is no truncal or gait ataxia.   Sensory exam is intact to light touch in the upper and lower extremities.   Gait, station and balance: She stands up from  the seated position with significant difficulty and needs assistance to stand. She has a moderately stooped posture and leans slightly to the right. She is able to maneuver her rolling walker fairly well. She has no freezing. Balance is impaired. She walks with decreased stride length and pace but otherwise no festination.    Assessment and Plan:   In summary, Kiira Brach is a very pleasant 82 year old female with an underlying medical history of spinal stenosis, hypothyroidism, osteoarthritis, hypertension, depression, obesity, necrotizing fasciitis of her right leg, history of cellulitis of her jaw, who presents for FU consultation of her advanced, right sided predominant Parkinson's, complicated by hallucinations, anxiety, depression, insomnia and RBD, lower extremity swelling and overall deconditioning and frailty secondary to advancing age and her advancing PD. Today, I suggested a mild increase in her Sinemet to 1-1/2 pills at 8 AM, 1-1/2 pills at 12, 2 pills at 4 PM and 2 pills at 8 PM to help with the afternoon declined and increase in afternoon freezing and gait difficulties. Furthermore, I suggested she try BuSpar half a pill twice daily scheduled for the residual anxiety and she continue with Lexapro 10 mg once daily. Previous workup included EEG and brain MRI and MRA from August 2015. She has had memory loss. I prescribed a lift chair previously. [name] has FMLA  in order to bring her to her appointments and for emergencies when she may have to leave work ad hoc. We will continue with Entacapone 200 mg 4 times a dayand and Sinemet CR at bedtime. She is advised to use her walker at all times. In the past, she was on Ambien, amitriptyline, amantadine, clonazepam, and Seroquel at different times with intolerance to these medications. An increase in Stalevo in the past resulted in increase in hallucinations, so we have to be mindful. I provided her with a new Rx for the dose adjustment for Sinemet. I  will see her back in 4 months, sooner if the need arises. I answered all their questions today and the patient and her daughter were in agreement.   I spent 25 minutes in total face-to-face time with the patient, more than 50% of which was spent in counseling and coordination of care, reviewing test results, reviewing medication and discussing or reviewing the diagnosis of PD, its prognosis and treatment options.

## 2015-05-08 NOTE — Patient Instructions (Addendum)
As discussed, we will try to increase the Sinemet as follows, take 1 1/2 pills at 8 am and 12 and 2 pills at 4 PM and 2 pills at 8 PM.  For the baseline residual anxiety, let's try low dose Buspar, 15 mg; take 1/2 pill 2 times a day. Lexapro will continue for now. You have a prior prescription for Buspar.

## 2015-05-09 ENCOUNTER — Telehealth: Payer: Self-pay | Admitting: Oncology

## 2015-05-09 NOTE — Telephone Encounter (Signed)
s.w. pt dtr and r/s missed appt....ok and aware of new d.t

## 2015-05-18 ENCOUNTER — Other Ambulatory Visit: Payer: Self-pay | Admitting: Internal Medicine

## 2015-06-12 ENCOUNTER — Other Ambulatory Visit: Payer: Self-pay | Admitting: Neurology

## 2015-06-12 DIAGNOSIS — G20 Parkinson's disease: Secondary | ICD-10-CM

## 2015-06-12 MED ORDER — ENTACAPONE 200 MG PO TABS: 200.0000 mg | ORAL_TABLET | Freq: Four times a day (QID) | ORAL | Status: DC

## 2015-06-12 NOTE — Telephone Encounter (Signed)
Shawn/[facility] [phone] mail order transferred RX from retail store but it had 60 days remaining. He is requesting a 90 day supply to save the pt money. Please call

## 2015-06-12 NOTE — Telephone Encounter (Signed)
Spoke to [employer], Alden at [employer] [facility] [employer]. Pt's comptan RX was transferred them, and it is currently a 30 day supply but it would save the pt money if it were a 90 day supply. This is a [name] pt, who is not here today, but I will send to the work in doctor to evaluate.

## 2015-06-23 ENCOUNTER — Other Ambulatory Visit: Payer: Self-pay | Admitting: Internal Medicine

## 2015-07-09 ENCOUNTER — Telehealth: Payer: Self-pay | Admitting: Oncology

## 2015-07-09 NOTE — Telephone Encounter (Signed)
pt called to cx appt..done....pt will r/s after she meets with PCP

## 2015-07-10 ENCOUNTER — Ambulatory Visit: Payer: Medicare Other | Admitting: Oncology

## 2015-07-18 ENCOUNTER — Other Ambulatory Visit: Payer: Self-pay | Admitting: Internal Medicine

## 2015-07-19 ENCOUNTER — Encounter: Payer: Self-pay | Admitting: Internal Medicine

## 2015-07-19 ENCOUNTER — Ambulatory Visit (INDEPENDENT_AMBULATORY_CARE_PROVIDER_SITE_OTHER): Payer: Medicare Other | Admitting: Internal Medicine

## 2015-07-19 ENCOUNTER — Telehealth: Payer: Self-pay | Admitting: *Deleted

## 2015-07-19 VITALS — BP 142/70 | HR 68 | Temp 98.1°F | Wt 218.0 lb

## 2015-07-19 DIAGNOSIS — G20 Parkinson's disease: Secondary | ICD-10-CM | POA: Diagnosis not present

## 2015-07-19 DIAGNOSIS — E039 Hypothyroidism, unspecified: Secondary | ICD-10-CM

## 2015-07-19 DIAGNOSIS — G20A1 Parkinson's disease without dyskinesia, without mention of fluctuations: Secondary | ICD-10-CM

## 2015-07-19 DIAGNOSIS — I5032 Chronic diastolic (congestive) heart failure: Secondary | ICD-10-CM

## 2015-07-19 DIAGNOSIS — B359 Dermatophytosis, unspecified: Secondary | ICD-10-CM | POA: Diagnosis not present

## 2015-07-19 DIAGNOSIS — I10 Essential (primary) hypertension: Secondary | ICD-10-CM

## 2015-07-19 DIAGNOSIS — R739 Hyperglycemia, unspecified: Secondary | ICD-10-CM

## 2015-07-19 DIAGNOSIS — F0280 Dementia in other diseases classified elsewhere without behavioral disturbance: Secondary | ICD-10-CM

## 2015-07-19 NOTE — Telephone Encounter (Signed)
Andrea Dorsey, daughter called and stated that patients blood pressure medication Metoprolol is not a good medication because her BP's have been fluctuating and patient has been retaining fluid. Wants to know if there is something different you can prescribe for better BP control. Patient has an appointment today with Dr. [name]. Printed message and placed in chart for appointment.

## 2015-07-19 NOTE — Telephone Encounter (Signed)
The edema and fluctuations are not b/c of her medication, but will address at appt

## 2015-07-19 NOTE — Patient Instructions (Addendum)
lotrisone over the counter ringworm cream for area on left leg.    Try the buspar at night for sleep and anxiety.  I don't think the toprol is causing the swelling or blood pressure fluctuations.  I think it is more of an autonomic dysfunction.  Fax me the bp results so I can look at them [phone].

## 2015-07-19 NOTE — Progress Notes (Signed)
Location:  [facility] clinic Provider:  [name] L. [name], D.O., C.M.D.  Code Status: DNR   Chief Complaint  Patient presents with  . Medical Management of Chronic Issues    3 mth follow-up, discuss BP med, [name] caretaker    HPI: Patient is a 82 y.o. female seen today for medical management of chronic diseases.   BP is all over the place.  Morning bad. 12-2 good.  2-7 up and down.  Andrea Dorsey wondered if we could change the toprol xl due to fluctuations.  Will go up and down with swelling.  Not eating salty foods.  Will have periods of frequent urination, then it will stop.  Sometimes overnight.  Mostly happens in the daytime.  She does not sleep well at night.  Wakes up and cannot go back to sleep.    [name] recommended Buspar for residual anxiety in addition to her lexapro.  Her caregiver says she is always worrying about something.  Pt admits sometimes worrying keeps her awake.   Spinal stenosis--more painful when she gets up.  Will be very stiff.    Past Medical History  Diagnosis Date  . Benign essential hypertension   . Hypothyroidism   . Osteoarthritis, generalized   . Parkinson disease ([place])   . Spinal stenosis   . History of necrotizing fasciitis     left leg, s/p debridement and graft  . Dementia in Parkinson's disease ([place])   . Depression     Past Surgical History  Procedure Laterality Date  . Skin debridement  2014    Brambhelt, MD  . Skin graft  2014    Brambhelt MD  . Spine surgery  2006    spinal stenosis  . Abdominal hysterectomy  1977    Allergies  Allergen Reactions  . Piperacillin-Tazobactam In Dex Rash    Unclear whether associated with clindamycin or zosyn, but probably more likely to be zosyn.   [name] Dye [Iodinated Diagnostic Agents]     Only when intravenous, not on external skin.  . Clindamycin Rash    Unclear whether patient has an actual allergy to clindamycin. On beta-lactam at the same time.      Medication List       This list is  accurate as of: 07/19/15  3:17 PM.  Always use your most recent med list.               acetaminophen 500 MG tablet  Commonly known as:  TYLENOL  Take 500 mg by mouth every 6 (six) hours as needed for mild pain.     aspirin EC 81 MG tablet  Take 81 mg by mouth daily.     carbidopa-levodopa 50-200 MG tablet  Commonly known as:  SINEMET CR  Take 1 tablet by mouth at bedtime.     carbidopa-levodopa 25-100 MG tablet  Commonly known as:  SINEMET IR  Take 1.5 pills at 8 am and 12 and 2 pills at 4 pm and 2 pills at 8 pm.     entacapone 200 MG tablet  Commonly known as:  COMTAN  Take 1 tablet (200 mg total) by mouth 4 (four) times daily. In lieu of stalevo, take with Sinemet 4 times a day     escitalopram 10 MG tablet  Commonly known as:  LEXAPRO  Take 1 tablet (10 mg total) by mouth daily.     gabapentin 100 MG capsule  Commonly known as:  NEURONTIN  Take 2 capsules (200 mg total)  by mouth at bedtime.     hydrALAZINE 25 MG tablet  Commonly known as:  APRESOLINE  Take one tablet by mouth three times daily     levothyroxine 25 MCG tablet  Commonly known as:  SYNTHROID, LEVOTHROID  Take one tablet by mouth once daily 30 minutes before breakfast for thyroid     metoprolol succinate 25 MG 24 hr tablet  Commonly known as:  TOPROL-XL  TAKE 2 TABLETS BY MOUTH TWICE DAILY     Vitamin D3 2000 units Tabs  Take 2,000 Units by mouth daily.        Review of Systems:  Review of Systems  Constitutional: Positive for malaise/fatigue. Negative for fever.  Respiratory: Negative for shortness of breath.   Cardiovascular: Negative for chest pain and palpitations.  Gastrointestinal: Negative for abdominal pain, constipation, blood in stool and melena.  Genitourinary: Negative for dysuria.  Musculoskeletal: Positive for back pain. Negative for falls.  Skin: Positive for itching and rash.  Neurological: Positive for weakness. Negative for dizziness and loss of consciousness.    Psychiatric/Behavioral: Positive for memory loss. The patient is nervous/anxious and has insomnia.     Health Maintenance  Topic Date Due  . TETANUS/TDAP  02/[birth year]/1950  . ZOSTAVAX  03/01/1991  . DEXA SCAN  02/29/1996  . INFLUENZA VACCINE  08/21/2015  . PNA vac Low Risk Adult  Completed    Physical Exam: Filed Vitals:   07/19/15 1438  BP: 142/70  Pulse: 68  Temp: 98.1 F (36.7 C)  TempSrc: Oral  Weight: 218 lb (98.884 kg)  SpO2: 96%   Body mass index is 36.28 kg/(m^2). Physical Exam  Constitutional: She appears well-developed and well-nourished. No distress.  Cardiovascular: Normal rate, regular rhythm, normal heart sounds and intact distal pulses.   Pulmonary/Chest: Effort normal and breath sounds normal.  Abdominal: Soft. Bowel sounds are normal. She exhibits no distension. There is no tenderness.  Musculoskeletal: Normal range of motion.  Comes with rollator walker  Neurological: She is alert.  Skin:  Small left shin raised pruritic lesion   Psychiatric:  Flat affect    Labs reviewed: Basic Metabolic Panel:  Recent Labs  12/18/14 1543 04/16/15 1554  NA 146* 145*  K 4.3 4.5  CL 106 106  CO2 24 24  GLUCOSE 113* 112*  BUN 20 22  CREATININE 1.06* 1.29*  CALCIUM 9.8 9.7  TSH 2.880  --    Liver Function Tests: No results for input(s): AST, ALT, ALKPHOS, BILITOT, PROT, ALBUMIN in the last 8760 hours. No results for input(s): LIPASE, AMYLASE in the last 8760 hours. No results for input(s): AMMONIA in the last 8760 hours. CBC:  Recent Labs  12/18/14 1543  WBC 6.6  NEUTROABS 3.9  HCT 51.3*  MCV 92  PLT 169   Lipid Panel: No results for input(s): CHOL, HDL, LDLCALC, TRIG, CHOLHDL, LDLDIRECT in the last 8760 hours. Lab Results  Component Value Date   HGBA1C 5.9* 04/16/2015    Assessment/Plan 1. Chronic diastolic CHF (congestive heart failure) (HCC) - would be cause of periods of swelling and usually worsened by foods with increased sodium in  them--counseled on this -cont prn lasix - CBC with Differential/Platelet; Future - Basic metabolic panel; Future - Lipid panel; Future  2. Benign essential hypertension -bps tend to run high in the afternoons, but they did not bring record so asked that it be faxed to me for review; however, as I recall, her bp has always fluctuated dramatically   3. Parkinson disease ([place]) -  cont current PD meds, no changes -has increased freezing in afternoons   4. Dementia due to Parkinson's disease without behavioral disturbance ([place]) - stable, cont same meds per neurology -for insomnia--was advised to use the buspar at bedtime to see if it will help with sleep and anxiety (apparently they haven't tried to give it to her since neuro prescribed it) - Lipid panel; Future  5. Ringworm -appears to be cause of small round raised lesion on left leg -treat with otc lotrisone  6. Hypothyroidism, unspecified hypothyroidism type -cont synthroid at current dose, last tsh normal - TSH; Future  7. Hyperglycemia - will f/u lab next time as we had no lab today - Hemoglobin A1c; Future   Labs/tests ordered:   Orders Placed This Encounter  Procedures  . CBC with Differential/Platelet    Standing Status: Future     Number of Occurrences:      Standing Expiration Date: 01/18/2016  . Basic metabolic panel    Standing Status: Future     Number of Occurrences:      Standing Expiration Date: 01/18/2016    Order Specific Question:  Has the patient fasted?    Answer:  Yes  . Hemoglobin A1c    Standing Status: Future     Number of Occurrences:      Standing Expiration Date: 01/18/2016  . Lipid panel    Standing Status: Future     Number of Occurrences:      Standing Expiration Date: 01/18/2016    Order Specific Question:  Has the patient fasted?    Answer:  Yes  . TSH    Standing Status: Future     Number of Occurrences:      Standing Expiration Date: 01/18/2016    Next appt:   10/15/2015  Esmirna Ravan L. Sharetha Newson, D.O. [facility] [address], [place] [address] Cell Phone (Mon-Fri 8am-5pm):  [phone] On Call:  [phone] & follow prompts after 5pm & weekends Office Phone:  [phone] Office Fax:  [phone]

## 2015-09-05 IMAGING — CT CT HEAD W/O CM
2 series · 15 of 30 positions shown, 19 images · non-contrast
Comparison: CT of the head performed 09/01/2013

CLINICAL DATA: Altered mental status.

EXAM:
CT HEAD WITHOUT CONTRAST
TECHNIQUE: Contiguous axial images were obtained from the base of the skull
through the vertex without intravenous contrast.

[Series 201: head w/o, idose (1) · axial · non-contrast · 0.47mm/px · z∈[+108,+238]mm · 13 of 32 slices shown, 17 images]
[im 3/32  brain]
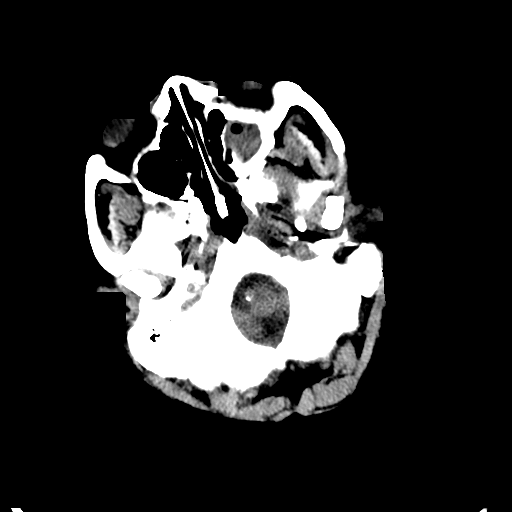
[im 3/32  bone]
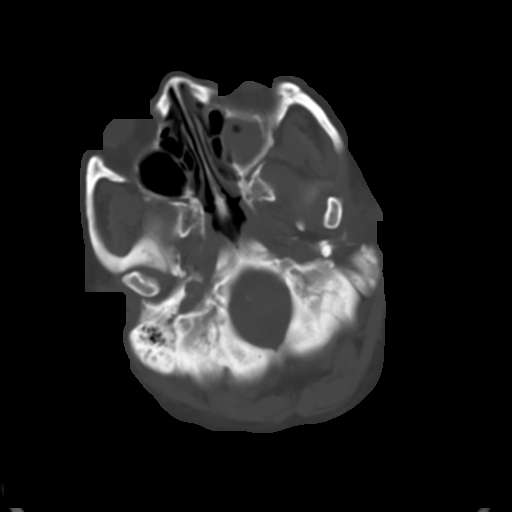
[im 5/32  brain]
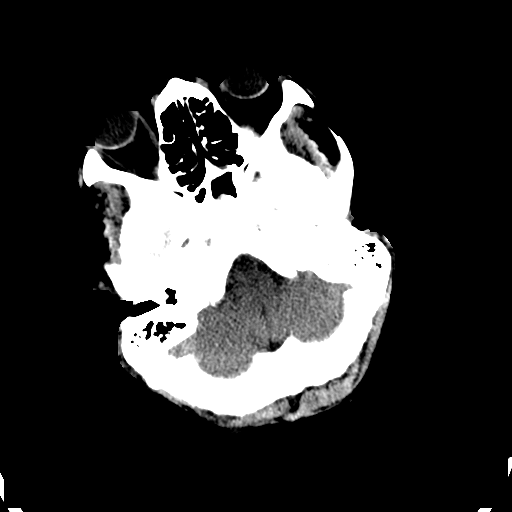
[im 7/32  brain]
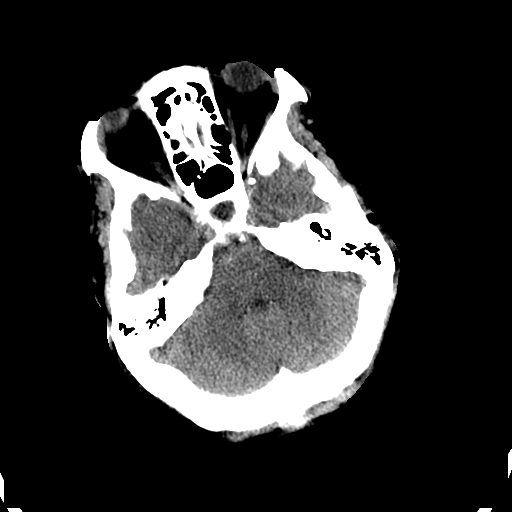
[im 9/32  brain]
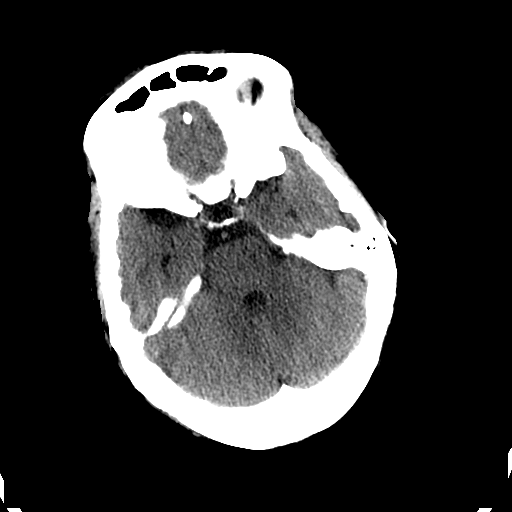
[im 12/32  brain]
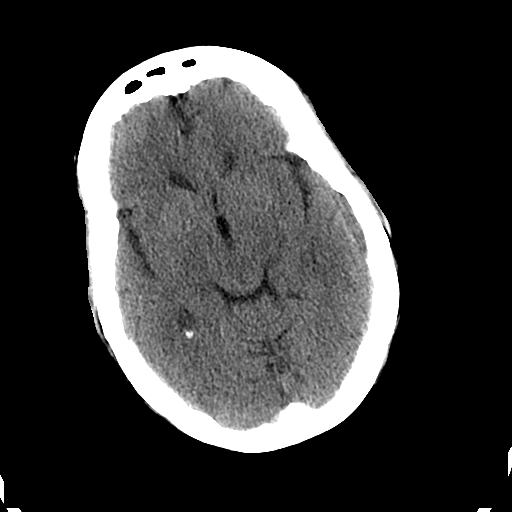
[im 12/32  bone]
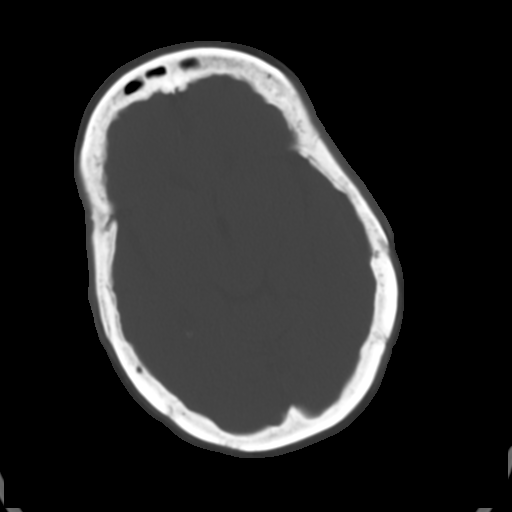
[im 14/32  brain]
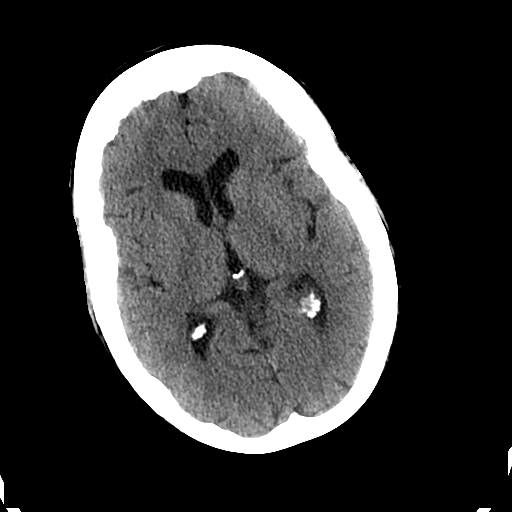
[im 16/32  brain]
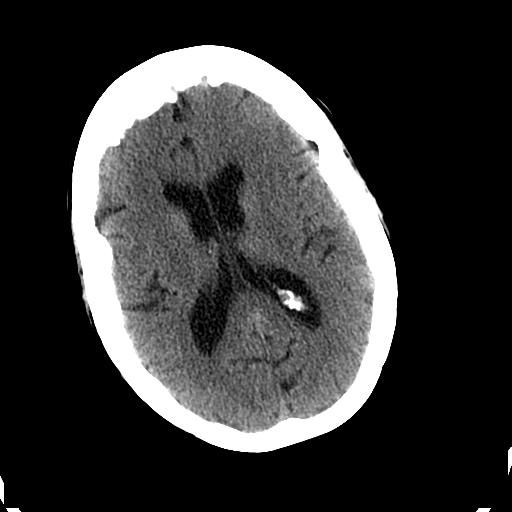
[im 18/32  brain]
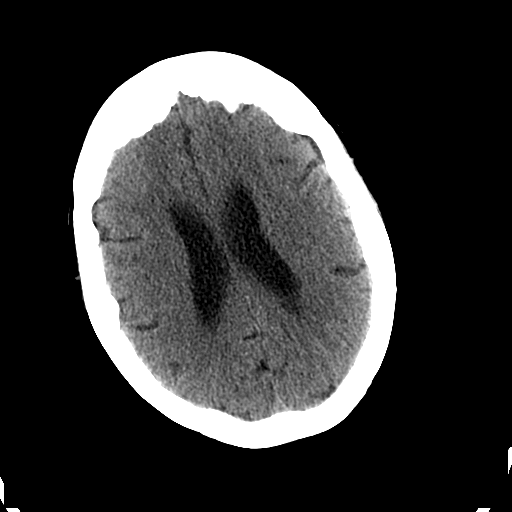
[im 20/32  brain]
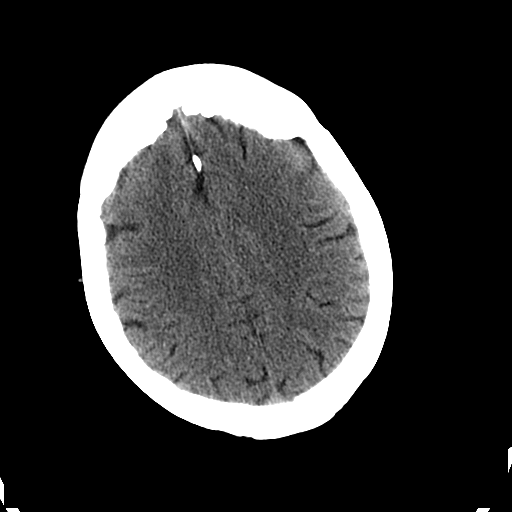
[im 20/32  bone]
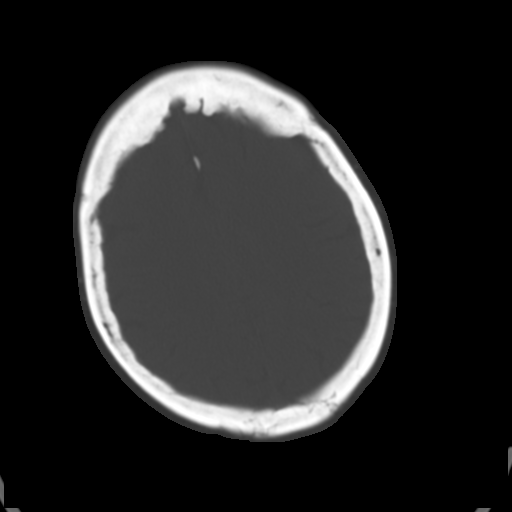
[im 23/32  brain]
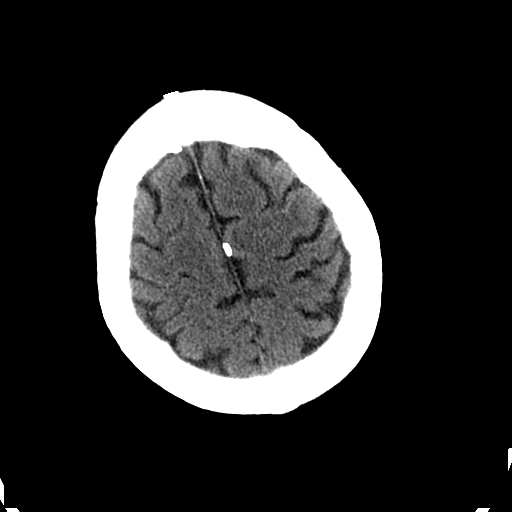
[im 25/32  brain]
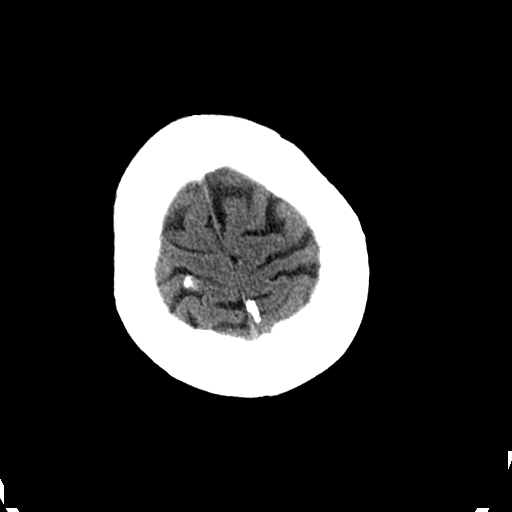
[im 27/32  brain]
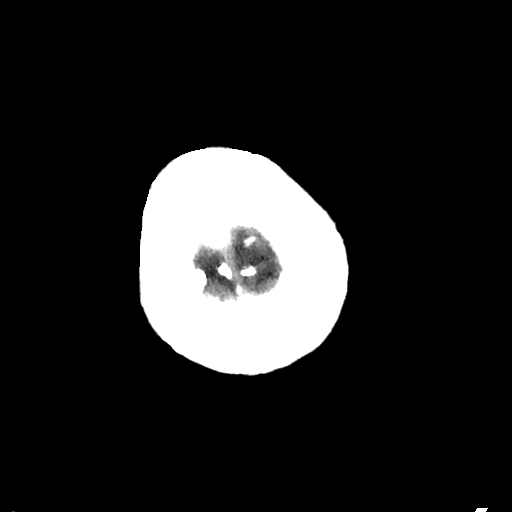
[im 29/32  brain]
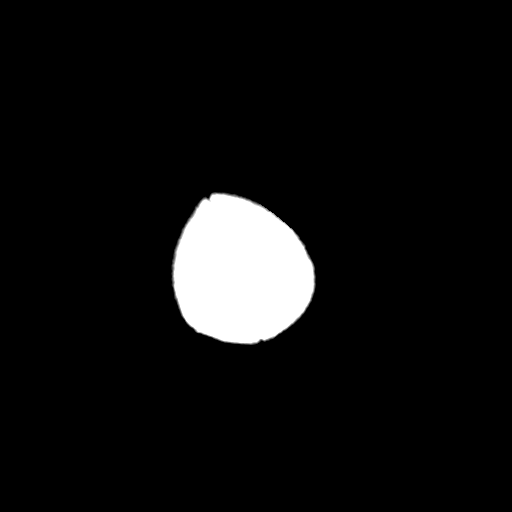
[im 29/32  bone]
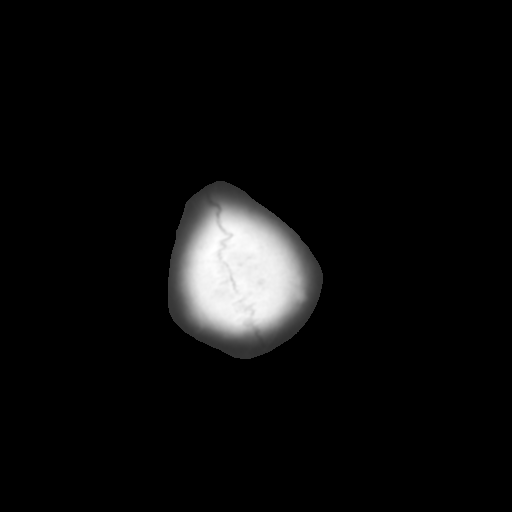

[Series 202: head w/o bone, idose (1) · axial · non-contrast · 0.47mm/px · z∈[+108,+128]mm · 2 of 32 slices shown]
[im 3/32  bone]
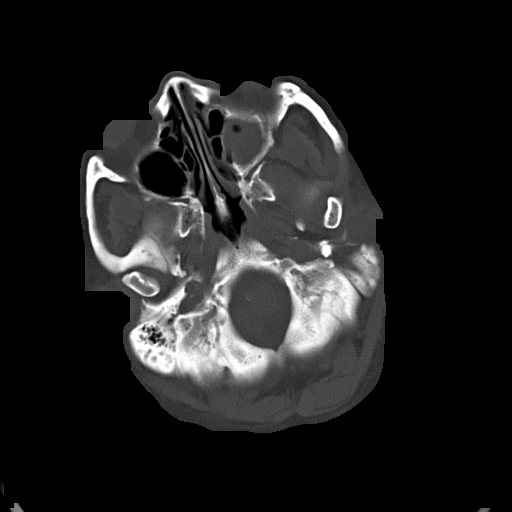
[im 7/32  bone]
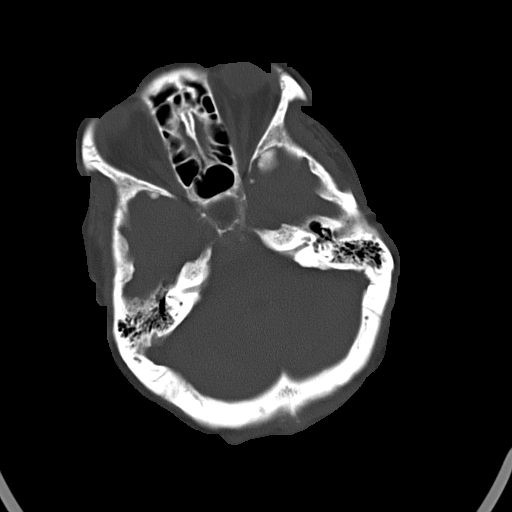

[15 of 30 positions shown; findings below may reference images not displayed]

FINDINGS: There is no evidence of acute infarction, mass lesion, or intra- or
extra-axial hemorrhage on CT.

The posterior fossa, including the cerebellum, brainstem and fourth
ventricle, is within normal limits. The third and lateral
ventricles, and basal ganglia are unremarkable in appearance. The
cerebral hemispheres are symmetric in appearance, with normal
gray-white differentiation. No mass effect or midline shift is seen.

There is somewhat unusual prominence of bony density about the
sella; this is thought to reflect diffuse nonspecific osseous
expansion along the base of the skull, as vague sclerotic expansion
is noted along the sphenoid bone bilaterally.

There is no evidence of fracture; visualized osseous structures are
unremarkable in appearance. The visualized portions of the orbits
are within normal limits. Inspissated mucus is noted filling the
left maxillary sinus. There is mild partial opacification of the
mastoid air cells bilaterally. The remaining paranasal sinuses are
well-aerated. No significant soft tissue abnormalities are seen.
IMPRESSION: 1. No acute intracranial pathology seen on CT.
2. Inspissated mucus filling the left maxillary sinus, and mild
partial opacification of the mastoid air cells bilaterally.

## 2015-09-05 IMAGING — CR DG CHEST 1V
1 series · 1 of 1 positions shown · non-contrast
Comparison: Chest radiograph November 08, 2012

CLINICAL DATA: Altered mental status.

EXAM:
CHEST - 1 VIEW

[x chest ap]
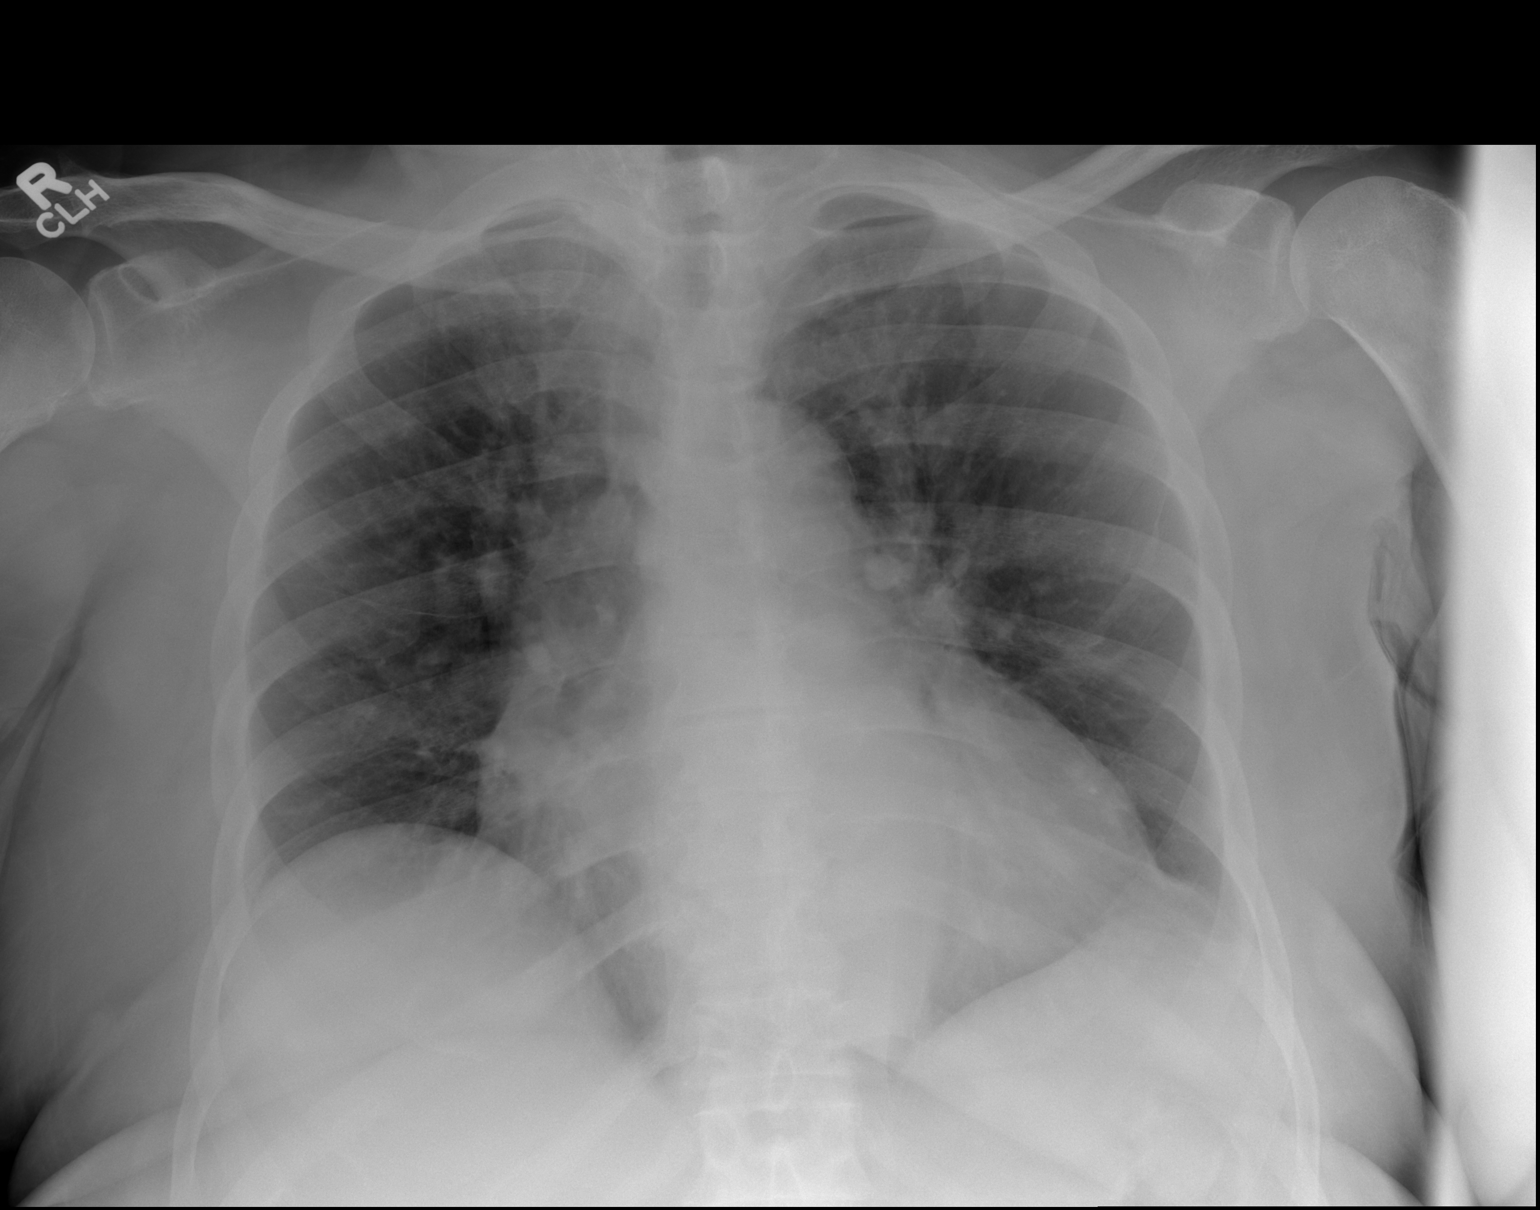

[1 of 1 positions shown; findings below may reference images not displayed]

FINDINGS: The cardiac silhouette appears moderately enlarged, similar. Central
pulmonary vascular congestion and mild interstitial prominence,
unchanged. No pleural effusions or focal consolidations. Minimal
linear densities left lung base. No pneumothorax.

Soft tissue planes and included osseous structures are
nonsuspicious. Moderate degenerative change of the thoracic spine.
IMPRESSION: Stable cardiomegaly, interstitial prominence likely reflects
pulmonary edema. Left lung base atelectasis.

  By: Zulfikri Loo

## 2015-09-06 ENCOUNTER — Ambulatory Visit: Payer: Medicare Other | Admitting: Neurology

## 2015-09-25 ENCOUNTER — Encounter: Payer: Self-pay | Admitting: Neurology

## 2015-09-25 ENCOUNTER — Ambulatory Visit (INDEPENDENT_AMBULATORY_CARE_PROVIDER_SITE_OTHER): Payer: Medicare Other | Admitting: Neurology

## 2015-09-25 VITALS — BP 136/72 | HR 76 | Resp 16 | Ht 65.0 in | Wt 215.0 lb

## 2015-09-25 DIAGNOSIS — G20 Parkinson's disease: Secondary | ICD-10-CM | POA: Diagnosis not present

## 2015-09-25 DIAGNOSIS — F419 Anxiety disorder, unspecified: Secondary | ICD-10-CM

## 2015-09-25 DIAGNOSIS — G4700 Insomnia, unspecified: Secondary | ICD-10-CM

## 2015-09-25 DIAGNOSIS — R413 Other amnesia: Secondary | ICD-10-CM

## 2015-09-25 DIAGNOSIS — G4752 REM sleep behavior disorder: Secondary | ICD-10-CM | POA: Diagnosis not present

## 2015-09-25 MED ORDER — GABAPENTIN 100 MG PO CAPS: 200.0000 mg | ORAL_CAPSULE | Freq: Every day | ORAL | 3 refills | Status: DC

## 2015-09-25 MED ORDER — CARBIDOPA-LEVODOPA 25-100 MG PO TBDP: 1.0000 | ORAL_TABLET | Freq: Two times a day (BID) | ORAL | 3 refills | Status: DC | PRN

## 2015-09-25 MED ORDER — ENTACAPONE 200 MG PO TABS: 200.0000 mg | ORAL_TABLET | Freq: Four times a day (QID) | ORAL | 2 refills | Status: DC

## 2015-09-25 NOTE — Progress Notes (Signed)
Subjective:    Patient ID: Andrea Dorsey is a 82 y.o. female.  HPI     Interim history:   Andrea Dorsey is a very pleasant 82 year old right-handed woman with an underlying complex medical history of spinal stenosis, necrotizing fasciitis, status post debridement and grafting on the right lower leg, lower extremity edema, insomnia, hypothyroidism, osteoarthritis, hypertension, depression, and obesity, who presents for followup consultation of her advanced, right-sided predominant Parkinson's disease, complicated by hallucinations, memory loss, mood disorder including anxiety and depression, sleep disorder including insomnia, OSA and RBD and advancing age. She is accompanied by her daughter today. I last saw her on 05/08/2015, at which time she reported still having trouble sleeping at night, her legs would feel heavy, she had trouble moving altogether. We have changed her Stalevo to generic Sinemet and entacapone secondary to cost. She was taking Sinemet 1-1/2 pills 4 times a day and the entacapone 200 mg strength one pill 4 times a day. She had not tried the BuSpar. She had residual anxiety. She was on Lexapro. She has caretakers during the day but daughter felt overwhelmed and has to help out a lot over the weekend 2 and works full-time as well. Daughter had noticed more freezing spells in the afternoons. I suggested she start a trial of low-dose BuSpar. We also increased her Sinemet to alternate 1-1/2 pills with 2 pills, we kept the entacapone the same.  Today, 09/25/15: She reports doing okay. Daughter reports that sleep is a little bit better at night. Daughter reduced her hours to 30/week. She is taking the C/L 2 pills alternating with one half pills each day, complains occasionally of legs feeling heavy and stiff, perhaps restless leg symptoms, takes Parcopa as needed, up to twice a day, typically no more than that. Anxiety is under control, continues to take Lexapro generic 10 mg daily. She takes  the gabapentin 200 mg at night which has helped her sleep some. She did not end up trying the BuSpar, we will keep that on standby for now. Daughter has noticed that her voice has become more softer, memory little worse, needs more verbal cues and instructions and guidance as to what to do next. Thankfully, she has 24-7 supervision, she has not fallen. She has a BM once a day, needs a stool softener daily, prn miralax.    Previously:  I saw her on 11/15/2014 at which time she had more numbness in her legs. She has more difficulty with bladder control. She had thankfully not fallen and anxiety was stable. She was using her walker. I suggested we continue with Stalevo 150 mg 4 times a day. She had problems with her doses and other medication intolerances in the past.   I saw her on 06/28/2014, at which time her daughter reported that things were fairly stable. Patient was reporting a cough at night. She was taking cough medicine daily. She reported postnasal drip and drooling at night. Her speech was softer. She was walking with a walker and thankfully had not fallen recently. Her appetite was good. She needed more assistance. She had a lift chair. She was on Stalevo 1 pill 4 times a day at 4 hourly intervals. She was on Sinemet CR at night. They never tried the BuSpar as the anxiety episodes or spells subsided. Her daughter suspected that these spells were in the context of a certain caretaker that cause stress to the patient. She was on Lexapro 10 mg daily. Her daughter requested FMLA paperwork so she could  make it to the appointments and also be able to go home in an emergency situation.   I saw her on 03/16/2014, at which time her daughter reported ongoing issues with spells of decreased alertness. She would not lose full consciousness but became listless and limp. She would not follow commands. The duration would be minutes to maybe 2 hours. Checking blood pressure at this times showed that she had some  high blood pressure at the time. There was no convulsion, no tongue bite, no full loss of consciousness, no loss of bowel or bladder control except for one time when she did wet herself. There was no time predilection. I suggested we continue with Lexapro at 10 mg and add low-dose BuSpar for anxiety. We have done workup in the form of brain scans and EEG in the recent past. She was no longer on Parcopa as it did not help. She was not sleeping well. We kept the Sinemet CR the same at night.   I saw her on 12/07/2013, at which time I talked to the patient's daughter, [name], separately before seeing the patient. Her daughter was concerned about the patient's general decline. She had more dream enactments. The patient felt that she was getting worse and that she could not sleep at night. She was dozing off during the day. She had not fallen thankfully. She could not tolerate clonazepam. She also had side effects on low-dose Seroquel. Melatonin at 10 mg at night did not seem to help. She was not drinking enough water. She had moved to a new home and had 3 caretakers taking turns and she was not without supervision. On the weekends her family will take turns supervising and helping. I increased her Lexapro and kept her other medications the same.    I saw her on 09/07/13, at which time her daughter reported no recent staring or zoning out spells. She was in the process of moving to a townhome with a caretaker for 4 days and 3 nights and family staying with her the rest of the time. I ordered an EEG. I also asked her to take Sinemet CR at night to get her through the night a little bit better. I started her on a low-dose Lexapro 5 mg strength. Her EEG on 09/22/2013 was reported as normal in the awake state. Her daughter requested a sooner appointment for more hallucinations and confusion. She had more memory loss.    I saw her on 06/08/2013, at which time I suggested she continue with Stalevo 150 4 times a day. She  previously has had hallucinations with increased doses. I suggested adding a little extra levodopa in the form of Parcopa half a pill up to twice daily as needed. This was supposed to help with freezing. In the interim, about a month later on 07/13/2013 she was seen by Charlott Holler, NP for a sooner than scheduled appointment at which time the Parcopa dose was increased to one whole pill up to twice daily for rigidity and freezing. She presented to the emergency room on 07/15/2013 with generalized complaint of weakness and freezing and was advised to increase her Parcopa. She presented voice recently this month to the emergency room with altered sensorium, or staring spells. Workup with head CT and MRIs were negative. It was felt that these were related to Parkinson's disease. She had a brain MRI and MRA without contrast on 09/03/2013:No acute intracranial abnormality. 2. Cerebral atrophy and single remote microhemorrhage in the left frontal lobe. 3. Unremarkable  head MRA. In addition, have reviewed the images through the PACS system. She had head CT without contrast on 09/03/2013:No acute intracranial pathology seen on CT. 2. Inspissated mucus filling the left maxillary sinus, and mild partial opacification of the mastoid air cells bilaterally. She had head CT without contrast on 09/01/2013: No acute intracranial pathology seen on CT. 2. Mild cortical volume loss noted. 3. Mild partial opacification of the mastoid air cells bilaterally. In addition, reviewed the images through the PACS system.   I saw her on 02/03/13, at which time I increased her Stalevo to 5 times a day. We talked about potential side effects. I asked her to stop the Seroquel and started her on low-dose clonazepam for insomnia and RBD. In the interim she was seen by our nurse practitioner, Ms. Lam on 04/08/2013, at which time I also saw her, and we mutually decided to decrease Stalevo back to 4 times a day because of worsening confusion and  hallucinations. Her clonazepam was discontinued by her PCP because of side effects. I suggested a trial of melatonin, 5-10 mg. We checked some labs including CBC, CMP, CRP, ESR and urinalysis. Labs showed no significant abnormalities, mild but stable kidney impairment was noted. She was encouraged to drink more water. She has seen her PCP in April 2015 and was advised to take her fluid pill as needed, an increase of melatonin to 10 mg.   I first met her on 10/15/2012, at which time a continued her Stalevo. I suggested a small dose of Seroquel to help her sleep and tone down the REM behavior disorder and encouraged him to discuss with her primary care physician the addition of an antidepressant. She presents with a complaint of worsening tremors.   She has been in [facility] [address] since 10/18/12. She has a Hx of vivid dreams and tends to act out in her sleep.   She previously used to see a neurologist at [facility], when she lived in [place], [place]. She was diagnosed with PD about 12 years ago when she was still residing in [place]. She needs assistance with her ADLs. She has been living with her daughter and son-in-law and they have looked into the possibility of a long-term care facility but the patient has been resistant. She has had problems at night including sundowning, confusion, inability to sleep.   Her symptoms started on one side with tremors, but the patient was not sure which side. She has been on Stalevo for the past 2 years, and prior to that she was on C/L, and prior to that she was on Amantadine. She may not have tried a dopamine agonist or rasagiline in the past. She has been experiencing nausea with her PD medications and still has occasional nausea. In March 2014 she developed necrotizing fasciitis and needed debridement and grafting. She developed hallucinations at the time, but was on pain medications at the time, but the [facility] persisted beyond that. She also started having  memory loss then. She developed cellulitis with complications in her jaw and needed all remaining teeth removed and had IV antibiotics in mid-2014. She has no FHx of PD or dementia. She has no Hx of psychiatric premorbid illness. In 2006 she had back surgery. She has no exposure to chemicals, or agent orange. She has been an anxious person. She is not able to sleep at night. She was tried on Ambien and amitriptyline. She has difficulty with sleep onset and sleep maintenance.  She has occasional urinary incontinence, occasional constipation. She snores, and needed to have a sleep study, but did not go. She has had some dream enactments and has slid out of bed.   She had been very opposed to going into assisted living, but understood that she given her complex medical history and multiple issues and advanced Parkinson's disease she was no longer safe to live by herself.  Her Past Medical History Is Significant For: Past Medical History:  Diagnosis Date  . Benign essential hypertension   . Dementia in Parkinson's disease ([place])   . Depression   . History of necrotizing fasciitis    left leg, s/p debridement and graft  . Hypothyroidism   . Osteoarthritis, generalized   . Parkinson disease ([place])   . Spinal stenosis     Her Past Surgical History Is Significant For: Past Surgical History:  Procedure Laterality Date  . ABDOMINAL HYSTERECTOMY  1977  . SKIN DEBRIDEMENT  2014   Ossineke, MD  . SKIN GRAFT  2014   Brambhelt MD  . SPINE SURGERY  2006   spinal stenosis    Her Family History Is Significant For: Family History  Problem Relation Age of Onset  . Heart disease Mother   . Heart disease Sister   . Hypertension Sister   . Stroke Sister   . Heart disease Sister     heart attack  . Cancer Sister     colon    Her Social History Is Significant For: Social History   Social History  . Marital status: Widowed    Spouse name: N/A  . Number of children: 2  . Years of education: 12    Occupational History  .      retired    Social History Main Topics  . Smoking status: Never Smoker  . Smokeless tobacco: Never Used  . Alcohol use No  . Drug use: No  . Sexual activity: Not Currently   Other Topics Concern  . None   Social History Narrative   Patient lives in assisted living.patient is right handed    Her Allergies Are:  Allergies  Allergen Reactions  . Piperacillin-Tazobactam In Dex Rash    Unclear whether associated with clindamycin or zosyn, but probably more likely to be zosyn.   [name] Dye [Iodinated Diagnostic Agents]     Only when intravenous, not on external skin.  . Clindamycin Rash    Unclear whether patient has an actual allergy to clindamycin. On beta-lactam at the same time.  :   Her Current Medications Are:  Outpatient Encounter Prescriptions as of 09/25/2015  Medication Sig  . acetaminophen (TYLENOL) 500 MG tablet Take 500 mg by mouth every 6 (six) hours as needed for mild pain.   Marland Kitchen aspirin EC 81 MG tablet Take 81 mg by mouth daily.  . carbidopa-levodopa (SINEMET CR) 50-200 MG tablet Take 1 tablet by mouth at bedtime.  . carbidopa-levodopa (SINEMET IR) 25-100 MG tablet Take 1.5 pills at 8 am and 12 and 2 pills at 4 pm and 2 pills at 8 pm.  . Cholecalciferol (VITAMIN D3) 2000 UNITS TABS Take 2,000 Units by mouth daily.   . entacapone (COMTAN) 200 MG tablet Take 1 tablet (200 mg total) by mouth 4 (four) times daily. In lieu of stalevo, take with Sinemet 4 times a day  . escitalopram (LEXAPRO) 10 MG tablet Take 1 tablet (10 mg total) by mouth daily.  Marland Kitchen gabapentin (NEURONTIN) 100 MG capsule Take 2 capsules (200  mg total) by mouth at bedtime.  . hydrALAZINE (APRESOLINE) 25 MG tablet Take one tablet by mouth three times daily  . levothyroxine (SYNTHROID, LEVOTHROID) 25 MCG tablet Take one tablet by mouth once daily 30 minutes before breakfast for thyroid  . metoprolol succinate (TOPROL-XL) 25 MG 24 hr tablet TAKE 2 TABLETS BY MOUTH TWICE DAILY    No facility-administered encounter medications on file as of 09/25/2015.   :  Review of Systems:  Out of a complete 14 point review of systems, all are reviewed and negative with the exception of these symptoms as listed below: Review of Systems  Neurological:       Daughter reports that patient is sleeping a little longer at night.  Patient seems a little more confused or needs additional direction in the morning.  Increased stiffness in L leg.  RLS seem to be worse.     Objective:  Neurologic Exam  Physical Exam Physical Examination:   Vitals:   09/25/15 1521  BP: 136/72  Pulse: 76  Resp: 16   General Examination: The patient is a very pleasant 82 y.o. female in no acute distress. She is obese. She appears less deconditioned. She is able to provide very little of her own her history today. She is fairly interactive and well groomed.   HEENT: Normocephalic, atraumatic, pupils are equal, round and reactive to light and accommodation. Extraocular tracking shows moderate saccadic breakdown without nystagmus noted. There is limitation to upper gaze. There is mild decrease in eye blink rate. Hearing is impaired mildly. Face is symmetric with moderate facial masking and normal facial sensation. There is a mild lower lip and jaw tremor. Neck is moderately rigid with intact passive ROM. There are no carotid bruits on auscultation. Oropharynx exam reveals moderate mouth dryness. There is significant airway crowding noted d/t redundant soft palate and large tongue. Speech is severely hypophonic. She is edentulous. Mallampati is class III. Tongue protrudes centrally and palate elevates symmetrically. There is mild drooling.   Chest: is clear to auscultation without wheezing, rhonchi or crackles noted.  Heart: sounds are regular and normal without murmurs, rubs or gallops noted.   Abdomen: is soft, non-tender and non-distended with normal bowel sounds appreciated on  auscultation.  Extremities: There is 1+ pitting edema in the distal lower extremities bilaterally. Pedal pulses are intact. Chronic stasis-like changes are noted in the distal legs bilaterally with s/p skin grafting on the right. There are no varicose veins - all unchanged from before.    Skin: is warm and dry with no trophic changes noted. Age-related changes are noted on the skin.   Musculoskeletal: exam reveals no obvious joint deformities, tenderness, joint swelling or erythema.  Neurologically:  Mental status: The patient is awake and alert, paying fair attention. She is able to partially provide the history. Her daughter provides most of the history. She is oriented to: person, place, time/date and situation. Her memory, attention, language and knowledge are impaired mildly. There is no aphasia, agnosia, apraxia or anomia. There is a moderate degree of bradyphrenia. Speech is moderately to severely hypophonic with mild dysarthria noted, however speech is edentulous as well. Mood is congruent and affect is better today.     On 12/07/2013: MMSE 17/30, CDT: 3/4, AFT: 5/min  On 06/28/2014: MMSE: 15/30, CDT: 2/4, AFT: 11/min.   On 09/25/2015: MMSE: 17/30, CDT: 1/4, AFT: 7/min.  Cranial nerves are as described above under HEENT exam. In addition, shoulder shrug is normal with equal shoulder height noted.  Motor exam: Normal bulk, and strength for age is noted. There are no dyskinesias noted.  Tone is mildly rigid with presence of cogwheeling in the right>left upper extremity and in both LEs. There is overall moderate bradykinesia. There is no drift or rebound.  There is an intermittent mild to moderate UE resting tremor on the right and a mild resting tremor in the left upper extremity.  Romberg is not tested as she is unstable without holding on.   Reflexes are 1+ in the upper extremities and trace in the knees and absent in the ankles.   Fine motor skills exam: Finger taps are moderately  impaired on the right and mild to moderately impaired on the left. Hand movements are moderately impaired on the right and mild to moderately impaired on the left. RAP (rapid alternating patting) is moderately impaired on the right and mildly impaired on the left. Foot taps are moderate to severely impaired on the right and moderately impaired on the left. Foot agility (in the form of heel stomping) is moderately impaired on the right and moderately impaired on the left.    Cerebellar testing shows no dysmetria or intention tremor on finger to nose testing. Heel to shin is not possible for her. There is no truncal or gait ataxia.   Sensory exam is intact to light touch in the upper and lower extremities.   Gait, station and balance: She stands up from the seated position with significant difficulty and needs assistance to stand. She has a moderately stooped posture and leans slightly to the right. She is able to maneuver her rolling walker fairly well. She has no freezing. Balance is impaired. She walks with decreased stride length and pace but otherwise no festination.    Assessment and Plan:   In summary, Falon Huesca is a very pleasant 82 year old female with an underlying medical history of spinal stenosis, hypothyroidism, osteoarthritis, hypertension, depression, obesity, necrotizing fasciitis of her right leg, history of cellulitis of her jaw, who presents for FU consultation of her advanced, right sided predominant Parkinson's, complicated by hallucinations, anxiety, depression, insomnia, memory loss, RBD, lower extremity swelling and overall deconditioning and frailty secondary to advancing age and her advancing PD.  She does better with consistency, has 4 caregivers, one mostly consistent during the week and a designated helper for each weekend day. Memory scores are slightly worse today, we will continue to monitor. She has a history of medication intolerances and we have to be careful with  any new medication. Today, I suggested we keep her Sinemet immediate release at 2 pills alternating with 1-1/2 pills for a total of 4 doses per day, she has been taking gabapentin 200 mg at night, which we will keep the same, in addition, she will continue with Lexapro generic 10 mg daily, and Parcopa as needed up to twice daily. I renewed her prescriptions that needed renewal today. Previous workup included EEG and brain MRI and MRA from August 2015. She has had memory loss. I prescribed a lift chair previously. [name] has FMLA  in order to bring her to her appointments and for emergencies when she may have to leave work ad hoc. She has also reduced her work hours to 30 hours per week. We will continue with Entacapone 200 mg 4 times a day and and Sinemet CR at bedtime. She is advised to use her walker at all times. In the past, she was on Ambien, amitriptyline, amantadine, clonazepam, and Seroquel at different times with intolerance  to these medications. An increase in Stalevo in the past resulted in increase in hallucinations, so we have to be mindful of this. I will see her back in 4 months, sooner if the need arises. I answered all their questions today and the patient and her daughter were in agreement.   I spent 25 minutes in total face-to-face time with the patient, more than 50% of which was spent in counseling and coordination of care, reviewing test results, reviewing medication and discussing or reviewing the diagnosis of PD, its prognosis and treatment options.

## 2015-09-25 NOTE — Patient Instructions (Addendum)
We will continue your medications, including Sinemet, parcopa as needed, gabapentin at night, long-acting Sinemet at night, lexapro for mood. Your memory scores are slightly worse, but we will monitor. Try to drink more water.

## 2015-10-05 ENCOUNTER — Telehealth: Payer: Self-pay | Admitting: *Deleted

## 2015-10-05 NOTE — Telephone Encounter (Signed)
Pt's daughter calling stating pt's blood pressure is 200/100 ( not really sure of the exact numbers) daughter states the caretaker took the blood pressure with an electronic BP machine. Pt is only saying she's weak, no other symptoms. Per daughter she wanted to change pt's appt from 9/25 to something sooner, but the only thing Dr. [name] has is in the afternoon and pt needs to be fasting. Per daughter she will continue to watch BP and call if anything changes with patient.

## 2015-10-12 ENCOUNTER — Other Ambulatory Visit: Payer: Self-pay | Admitting: Internal Medicine

## 2015-10-15 ENCOUNTER — Encounter: Payer: Self-pay | Admitting: Internal Medicine

## 2015-10-15 ENCOUNTER — Ambulatory Visit (INDEPENDENT_AMBULATORY_CARE_PROVIDER_SITE_OTHER): Payer: Medicare Other | Admitting: Internal Medicine

## 2015-10-15 VITALS — BP 140/88 | HR 67 | Temp 97.6°F | Ht 65.0 in | Wt 215.0 lb

## 2015-10-15 DIAGNOSIS — I5032 Chronic diastolic (congestive) heart failure: Secondary | ICD-10-CM

## 2015-10-15 DIAGNOSIS — Z23 Encounter for immunization: Secondary | ICD-10-CM

## 2015-10-15 DIAGNOSIS — IMO0001 Reserved for inherently not codable concepts without codable children: Secondary | ICD-10-CM

## 2015-10-15 DIAGNOSIS — M6289 Other specified disorders of muscle: Secondary | ICD-10-CM | POA: Diagnosis not present

## 2015-10-15 DIAGNOSIS — I10 Essential (primary) hypertension: Secondary | ICD-10-CM | POA: Diagnosis not present

## 2015-10-15 DIAGNOSIS — F0280 Dementia in other diseases classified elsewhere without behavioral disturbance: Secondary | ICD-10-CM

## 2015-10-15 DIAGNOSIS — G20A1 Parkinson's disease without dyskinesia, without mention of fluctuations: Secondary | ICD-10-CM

## 2015-10-15 DIAGNOSIS — Z7409 Other reduced mobility: Secondary | ICD-10-CM | POA: Diagnosis not present

## 2015-10-15 DIAGNOSIS — R079 Chest pain, unspecified: Secondary | ICD-10-CM | POA: Diagnosis not present

## 2015-10-15 DIAGNOSIS — G20 Parkinson's disease: Secondary | ICD-10-CM | POA: Diagnosis not present

## 2015-10-15 DIAGNOSIS — M6281 Muscle weakness (generalized): Secondary | ICD-10-CM

## 2015-10-15 LAB — CBC WITH DIFFERENTIAL/PLATELET
Basophils Absolute: 61 cells/uL (ref 0–200)
Basophils Relative: 1 %
Eosinophils Absolute: 122 cells/uL (ref 15–500)
Eosinophils Relative: 2 %
HCT: 50 % — ABNORMAL HIGH (ref 35.0–45.0)
Hemoglobin: 16.9 g/dL — ABNORMAL HIGH (ref 11.7–15.5)
Lymphocytes Relative: 24 %
Lymphs Abs: 1464 cells/uL (ref 850–3900)
MCH: 31.6 pg (ref 27.0–33.0)
MCHC: 33.8 g/dL (ref 32.0–36.0)
MCV: 93.5 fL (ref 80.0–100.0)
MPV: 11.1 fL (ref 7.5–12.5)
Monocytes Absolute: 610 cells/uL (ref 200–950)
Monocytes Relative: 10 %
Neutro Abs: 3843 cells/uL (ref 1500–7800)
Neutrophils Relative %: 63 %
Platelets: 161 10*3/uL (ref 140–400)
RBC: 5.35 MIL/uL — ABNORMAL HIGH (ref 3.80–5.10)
RDW: 14 % (ref 11.0–15.0)
WBC: 6.1 10*3/uL (ref 3.8–10.8)

## 2015-10-15 LAB — COMPREHENSIVE METABOLIC PANEL
ALT: 4 U/L — ABNORMAL LOW (ref 6–29)
AST: 11 U/L (ref 10–35)
Albumin: 3.9 g/dL (ref 3.6–5.1)
Alkaline Phosphatase: 62 U/L (ref 33–130)
BUN: 16 mg/dL (ref 7–25)
CO2: 26 mmol/L (ref 20–31)
Calcium: 9.8 mg/dL (ref 8.6–10.4)
Chloride: 110 mmol/L (ref 98–110)
Creat: 1.21 mg/dL — ABNORMAL HIGH (ref 0.60–0.88)
Glucose, Bld: 109 mg/dL — ABNORMAL HIGH (ref 65–99)
Potassium: 4.2 mmol/L (ref 3.5–5.3)
Sodium: 142 mmol/L (ref 135–146)
Total Bilirubin: 1 mg/dL (ref 0.2–1.2)
Total Protein: 6.2 g/dL (ref 6.1–8.1)

## 2015-10-15 LAB — TSH: TSH: 5.06 mIU/L — ABNORMAL HIGH

## 2015-10-15 LAB — LIPID PANEL
Cholesterol: 214 mg/dL — ABNORMAL HIGH (ref 125–200)
HDL: 62 mg/dL (ref >=46)
LDL Cholesterol: 136 mg/dL — ABNORMAL HIGH (ref <130)
Total CHOL/HDL Ratio: 3.5 Ratio (ref <=5.0)
Triglycerides: 79 mg/dL (ref <150)
VLDL: 16 mg/dL (ref <30)

## 2015-10-15 NOTE — Progress Notes (Signed)
Location:  [facility] clinic Provider:  Tashima Scarpulla L. [name], D.O., C.M.D.  Code Status: DNR Goals of Care:  Advanced Directives 07/19/2015  Does patient have an advance directive? Yes  Type of Advance Directive [place]  Does patient want to make changes to advanced directive? No - Patient declined  Copy of advanced directive(s) in chart? Yes  Would patient like information on creating an advanced directive? -  Pre-existing out of facility DNR order (yellow form or pink MOST form) -     Chief Complaint  Patient presents with  . Medical Management of Chronic Issues    4 month follow-up on HTN, fasting for labs, here with caregiver: [name]. Patient c/o chest pain and left arm pain x 1 week.     HPI: Patient is a 82 y.o. female seen today for medical management of chronic diseases.    She is here with her caregiver.    Chest pain which caregiver says is in the middle and pt reports it's on the left side.  She says it hurts to touch and she rubs it and it feels a little better.  Left arm also hurts in upper arm from shoulder to just below elbow.  It's like a cramp.  Yesterday, she was clammy also.  Her daughter thought maybe it was GERD.    BP had been very high last week, but it got down to 164 by the time her daughter got home.  Highest this week was 154/80.  [place] bp. Was fine at neurology and 140/88 this am here.  Sometimes, she will be dizzy in the morning.  Wt stable at 212-215 lbs.    She does use a trapeze device to help her with transfers and repositioning in her bed.  She is very hard to reposition and her daughter has tendonitis in her elbow from moving her.  She gets short of breath during transfers, as well.    Neurology noted decline in processing information and responding.    Parkinson's medication has been well tolerated recently.  She is now taking GERD medication--omeprazole.    Past Medical History:  Diagnosis Date  . Benign essential  hypertension   . Dementia in Parkinson's disease ([place])   . Depression   . History of necrotizing fasciitis    left leg, s/p debridement and graft  . Hypothyroidism   . Osteoarthritis, generalized   . Parkinson disease ([place])   . Spinal stenosis     Past Surgical History:  Procedure Laterality Date  . ABDOMINAL HYSTERECTOMY  1977  . SKIN DEBRIDEMENT  2014   Brambhelt, [place]  . SKIN GRAFT  2014   [place]  2006   spinal stenosis    Allergies  Allergen Reactions  . Piperacillin-Tazobactam In Dex Rash    Unclear whether associated with clindamycin or zosyn, but probably more likely to be zosyn.   [name] Dye [Iodinated Diagnostic Agents]     Only when intravenous, not on external skin.  . Clindamycin Rash    Unclear whether patient has an actual allergy to clindamycin. On beta-lactam at the same time.      Medication List       Accurate as of 10/15/15  8:32 AM. Always use your most recent med list.          acetaminophen 500 MG tablet Commonly known as:  TYLENOL Take 500 mg by mouth every 6 (six) hours as needed for mild pain.  aspirin EC 81 MG tablet Take 81 mg by mouth daily.   carbidopa-levodopa 50-200 MG tablet Commonly known as:  SINEMET CR Take 1 tablet by mouth at bedtime.   carbidopa-levodopa 25-100 MG tablet Commonly known as:  SINEMET IR Take 1.5 pills at 8 am and 12 and 2 pills at 4 pm and 2 pills at 8 pm.   carbidopa-levodopa 25-100 MG disintegrating tablet Commonly known as:  PARCOPA Take 1 tablet by mouth 2 (two) times daily as needed.   entacapone 200 MG tablet Commonly known as:  COMTAN Take 1 tablet (200 mg total) by mouth 4 (four) times daily. In lieu of stalevo, take with Sinemet 4 times a day   escitalopram 10 MG tablet Commonly known as:  LEXAPRO Take 1 tablet (10 mg total) by mouth daily.   gabapentin 100 MG capsule Commonly known as:  NEURONTIN Take 2 capsules (200 mg total) by mouth at bedtime.   hydrALAZINE 25  MG tablet Commonly known as:  APRESOLINE Take one tablet by mouth three times daily   levothyroxine 25 MCG tablet Commonly known as:  SYNTHROID, LEVOTHROID Take one tablet by mouth once daily 30 minutes before breakfast for thyroid   metoprolol succinate 25 MG 24 hr tablet Commonly known as:  TOPROL-XL TAKE 2 TABLETS BY MOUTH TWICE DAILY   Vitamin D3 2000 units Tabs Take 2,000 Units by mouth daily.       Review of Systems:  Review of Systems  Constitutional: Positive for malaise/fatigue. Negative for chills, fever and weight loss.  HENT: Negative for congestion and hearing loss.   Eyes: Negative for blurred vision.  Respiratory: Positive for shortness of breath. Negative for cough and wheezing.   Cardiovascular: Positive for chest pain. Negative for palpitations, orthopnea, leg swelling and PND.  Gastrointestinal: Positive for constipation and heartburn. Negative for abdominal pain, blood in stool and melena.       Constipation controlled  Genitourinary: Negative for dysuria.  Musculoskeletal: Positive for back pain and myalgias. Negative for falls.       Left upper arm pain  Skin: Negative for itching and rash.  Neurological: Positive for dizziness, sensory change and weakness. Negative for loss of consciousness.       Right sided PD  Psychiatric/Behavioral: Positive for depression and memory loss.    Health Maintenance  Topic Date Due  . TETANUS/TDAP  02/28/1950  . ZOSTAVAX  03/01/1991  . DEXA SCAN  02/29/1996  . INFLUENZA VACCINE  08/21/2015  . PNA vac Low Risk Adult  Completed    Physical Exam: Vitals:   10/15/15 0815  BP: 140/88  Pulse: 67  Temp: 97.6 F (36.4 C)  TempSrc: Oral  SpO2: 96%  Weight: 215 lb (97.5 kg)  Height: 5\' 5"  (1.651 m)   Body mass index is 35.78 kg/m. Physical Exam  Constitutional: She appears well-developed and well-nourished. No distress.  Cardiovascular: Normal rate, regular rhythm and normal heart sounds.   Pulmonary/Chest:  Effort normal and breath sounds normal.  Abdominal: Soft. Bowel sounds are normal.  Musculoskeletal: Normal range of motion. She exhibits tenderness.  Gets around with rollator, but transfers are very difficult; tenderness of left chest; able to lift both arms equally, but reports bilateral heaviness  Neurological: She is alert. She exhibits abnormal muscle tone.  Skin: Skin is warm and dry. Capillary refill takes less than 2 seconds.  Psychiatric:  Chronic flat affect    Labs reviewed: Basic Metabolic Panel:  Recent Labs  12/18/14 1543 04/16/15 1554  NA 146* 145*  K 4.3 4.5  CL 106 106  CO2 24 24  GLUCOSE 113* 112*  BUN 20 22  CREATININE 1.06* 1.29*  CALCIUM 9.8 9.7  TSH 2.880  --    Liver Function Tests: No results for input(s): AST, ALT, ALKPHOS, BILITOT, PROT, ALBUMIN in the last 8760 hours. No results for input(s): LIPASE, AMYLASE in the last 8760 hours. No results for input(s): AMMONIA in the last 8760 hours. CBC:  Recent Labs  12/18/14 1543  WBC 6.6  NEUTROABS 3.9  HCT 51.3*  MCV 92  PLT 169   Lab Results  Component Value Date   HGBA1C 5.9 (H) 04/16/2015    Procedures since last visit: EKG today:  Some flattening of T waves vs. 2 years ago, HR 61, atrial flutter  Assessment/Plan 1. Left sided chest pain -seems to be musculoskeletal--reproducible on exam and EKG without signs of acute infarction  2. Chronic diastolic CHF (congestive heart failure) (HCC) -has been stable for a few years now -cont same medications and monitor  3. Essential hypertension, benign -bp varies from [id number] systolic, but has not tolerated higher doses of medications due to her orthostatic hypotension related to PD  4. Parkinson disease ([place]) -cont current medications which she has been tolerating well -f/u with [name] as scheduled -mobility is worsening and she has a great deal of difficulty with transfers--needs a hoyer lift to save the backs and shoulders of her  daughters and caregiver as well as herself (having pain and heaviness in upper arms suggesting this may be due to use of trapeze her son in law made)--order written; also ordered hospital bed to make repositioning easier and also to help with CHF (elevating HOB, feet for edema)  5. Dementia due to Parkinson's disease without behavioral disturbance ([place]) -is gradually worsening based on testing at neurology  6. Immobility -see above, family does want her to stay at home so ordered hoyer and hospital bed for her with detailed diagnoses on both prescriptions  7. Obesity, Class II, BMI 35.0-39.9, with comorbidity (see actual BMI) (HCC) -has been stable around 212 to 215 lbs, but unable to exercise much at all to lose weight  8. Proximal muscle weakness -as PD progresses and she becomes more frail -uses rollator to walk and needs assistance of others for transfers especially getting into bed -her back pain makes it necessary that she has a cushioned/thicker mattress on the bed  9. Need for prophylactic vaccination and inoculation against influenza -flu shot given today  Labs/tests ordered:  EKG, flu shot Orders Placed This Encounter  Procedures  . CBC with Differential/Platelet  . Comprehensive metabolic panel    Order Specific Question:   Has the patient fasted?    Answer:   Yes  . Lipid panel    Order Specific Question:   Has the patient fasted?    Answer:   Yes  . TSH  . Hemoglobin A1c   Next appt:  3 mos annual wellness/cpe with me  Jamiya Nims L. Santresa Levett, D.O. [facility] [address], [place] [address] Cell Phone (Mon-Fri 8am-5pm):  [phone] On Call:  [phone] & follow prompts after 5pm & weekends Office Phone:  [phone] Office Fax:  [phone]

## 2015-10-16 LAB — HEMOGLOBIN A1C
Hgb A1c MFr Bld: 5.5 % (ref <5.7)
Mean Plasma Glucose: 111 mg/dL

## 2015-10-25 ENCOUNTER — Encounter: Payer: Self-pay | Admitting: *Deleted

## 2015-11-05 ENCOUNTER — Telehealth: Payer: Self-pay

## 2015-11-05 DIAGNOSIS — G20 Parkinson's disease: Secondary | ICD-10-CM

## 2015-11-05 MED ORDER — CARBIDOPA-LEVODOPA 25-100 MG PO TABS: ORAL_TABLET | ORAL | 3 refills | Status: DC

## 2015-11-05 NOTE — Telephone Encounter (Signed)
Refill request for Sinemet

## 2015-11-23 ENCOUNTER — Other Ambulatory Visit: Payer: Self-pay | Admitting: Internal Medicine

## 2015-11-26 ENCOUNTER — Other Ambulatory Visit: Payer: Self-pay

## 2015-11-26 MED ORDER — HYDRALAZINE HCL 25 MG PO TABS: ORAL_TABLET | ORAL | 2 refills | Status: DC

## 2015-11-27 ENCOUNTER — Telehealth: Payer: Self-pay

## 2015-11-27 DIAGNOSIS — F419 Anxiety disorder, unspecified: Secondary | ICD-10-CM

## 2015-11-27 MED ORDER — GABAPENTIN 100 MG PO CAPS: 200.0000 mg | ORAL_CAPSULE | Freq: Every day | ORAL | 3 refills | Status: DC

## 2015-11-27 NOTE — Telephone Encounter (Signed)
Refill request

## 2015-12-21 ENCOUNTER — Other Ambulatory Visit: Payer: Self-pay | Admitting: Internal Medicine

## 2016-01-09 ENCOUNTER — Telehealth: Payer: Self-pay | Admitting: Neurology

## 2016-01-09 DIAGNOSIS — G20 Parkinson's disease: Secondary | ICD-10-CM

## 2016-01-09 MED ORDER — ENTACAPONE 200 MG PO TABS: 200.0000 mg | ORAL_TABLET | Freq: Four times a day (QID) | ORAL | 0 refills | Status: DC

## 2016-01-09 NOTE — Telephone Encounter (Signed)
Pts daughter Andrea Dorsey called needing to see about getting a temporary RX for entacapone (COMTAN) 200 MG tablet.  Mail in order thru [facility] Mail service will be late getting delivered.  Andrea Dorsey is requesting no more than 5 days of the Rx.  Pharmacy [facility] [place]

## 2016-01-09 NOTE — Telephone Encounter (Signed)
Rx for generic comtan for 5 days to bridge

## 2016-01-09 NOTE — Telephone Encounter (Signed)
I spoke to pt's daughter, [name], and advised her that the generic comtan was sent to [facility] for a 5 day supply until the mail order supply gets in. [name] verbalized understanding.

## 2016-01-09 NOTE — Addendum Note (Signed)
Addended by: Star Age on: 01/09/2016 05:21 PM   Modules accepted: Orders

## 2016-01-09 NOTE — Telephone Encounter (Signed)
Ok to refill for days?

## 2016-01-10 ENCOUNTER — Other Ambulatory Visit: Payer: Self-pay | Admitting: *Deleted

## 2016-01-10 MED ORDER — LEVOTHYROXINE SODIUM 25 MCG PO TABS: ORAL_TABLET | ORAL | 3 refills | Status: DC

## 2016-01-10 NOTE — Telephone Encounter (Signed)
[facility] 

## 2016-01-28 ENCOUNTER — Ambulatory Visit: Payer: Medicare Other | Admitting: Neurology

## 2016-02-18 ENCOUNTER — Encounter: Payer: Self-pay | Admitting: Internal Medicine

## 2016-02-18 ENCOUNTER — Ambulatory Visit (INDEPENDENT_AMBULATORY_CARE_PROVIDER_SITE_OTHER): Payer: Medicare Other | Admitting: Internal Medicine

## 2016-02-18 VITALS — BP 120/60 | HR 70 | Temp 98.0°F | Ht 65.0 in | Wt 211.0 lb

## 2016-02-18 DIAGNOSIS — E669 Obesity, unspecified: Secondary | ICD-10-CM | POA: Diagnosis not present

## 2016-02-18 DIAGNOSIS — G20 Parkinson's disease: Secondary | ICD-10-CM | POA: Diagnosis not present

## 2016-02-18 DIAGNOSIS — G20A1 Parkinson's disease without dyskinesia, without mention of fluctuations: Secondary | ICD-10-CM

## 2016-02-18 DIAGNOSIS — I5032 Chronic diastolic (congestive) heart failure: Secondary | ICD-10-CM

## 2016-02-18 DIAGNOSIS — J069 Acute upper respiratory infection, unspecified: Secondary | ICD-10-CM

## 2016-02-18 DIAGNOSIS — E039 Hypothyroidism, unspecified: Secondary | ICD-10-CM | POA: Diagnosis not present

## 2016-02-18 DIAGNOSIS — F0280 Dementia in other diseases classified elsewhere without behavioral disturbance: Secondary | ICD-10-CM

## 2016-02-18 DIAGNOSIS — Z0000 Encounter for general adult medical examination without abnormal findings: Secondary | ICD-10-CM | POA: Diagnosis not present

## 2016-02-18 DIAGNOSIS — IMO0001 Reserved for inherently not codable concepts without codable children: Secondary | ICD-10-CM

## 2016-02-18 LAB — TSH: TSH: 3.33 mIU/L

## 2016-02-18 NOTE — Patient Instructions (Signed)
I recommend a vaporizer at night to keep her from wheezing when she goes to bed.  Her lungs were clear today.  I think she is getting better from her cold.  Also continue the cough syrup at night.

## 2016-02-18 NOTE — Progress Notes (Signed)
Provider:  Rexene Edison. [name], D.O., C.M.D. Location:   [place]   Place of Service:   clinic  Previous PCP: Hollace Kinnier, DO Patient Care Team: Gayland Curry, DO as PCP - General (Geriatric Medicine) Star Age, MD as Attending Physician (Neurology)  Extended Emergency Contact Information Primary Emergency Contact: Davis,Wanda Address: [place], [place] [address] [place] Phone: [phone] Work Phone: [phone] Mobile Phone: [phone] Relation: Daughter Secondary Emergency Contact: Dorrough,Darren Address: [place] Phone: [phone] Relation: Son  Code Status: DNR Goals of Care: Advanced Directive information Advanced Directives 02/18/2016  Does Patient Have a Medical Advance Directive? Yes  Type of Advance Directive [place]  Does patient want to make changes to medical advance directive? No - Patient declined  Copy of [place] in Chart? Yes  Would patient like information on creating a medical advance directive? -  Pre-existing out of facility DNR order (yellow form or pink MOST form) -   Chief Complaint  Patient presents with  . Annual Exam    wellness exam  . MMSE    17/30 failed clock    HPI: Patient is a 83 y.o. female seen today for an annual wellness exam.  She is here with [name], her caregiver.    Bone density has been ordered on several occasions and family/caregivers have not taken patient to get it.    Zostavax and tdap also never gotten though printed prescriptions have been provided to get done at the pharmacy.  Had a cold last week and feels better from that now.   Functional Status Survey: Is the patient deaf or have difficulty hearing?: No Does the patient have difficulty seeing, even when wearing glasses/contacts?: Yes Does the patient have difficulty concentrating, remembering, or making decisions?: Yes Does the  patient have difficulty walking or climbing stairs?: Yes Does the patient have difficulty dressing or bathing?: Yes Does the patient have difficulty doing errands alone such as visiting a doctor's office or shopping?: Yes     Amb Nursing Assessment - 02/18/16 0939      Pre-visit preparation   Pre-visit preparation completed No     Pain Assessment   Pain Assessment 0-10   Pain Score --  could not tell me   Pain Type Neuropathic pain  had ingrown toe removed and another debrided   Pain Location Foot   Pain Orientation Left   Pain Radiating Towards none   Pain Descriptors / Indicators Aching   Pain Onset 1 to 4 weeks ago  better now   Pain Frequency Intermittent     Nutrition Screen   BMI - recorded 35.11   Nutritional Status BMI > 30  Obese   Nutritional Risks Other (Comment)  parkinson's   Diabetes Yes   CBG done? No   Did pt. bring in CBG monitor from home? No     Functional Status   Activities of Daily Living (!)  Needs Assist   Feeding Independent   Dressing/Grooming Needs assistance   Bathing Needs assistance   Toileting Independent with device- listed below   Transfer Independent with device- listed below   Ambulation Independent with device- listed below   Home Assistive Devices/Equipment Walker (specify Type)  rollator   Medication Administration Needs assistance (comment)  does not remember and needs help to keep track   Is this a change from baseline? Pre-admission  baseline   Home Management Dependent     Risk/Barriers  Assessment   Barriers to Care Management & Learning Cognitive impairments;Sensory defecits   Sensory deficits Vision     Abuse/Neglect Assessment   Do you feel unsafe in your current relationship? No   Do you feel physically threatened by others? No   Anyone hurting you at home, work, or school? No   Unable to ask? No   Information provided on Community resources No     Patient Literacy   How often do you need to have someone help  you when you read instructions, pamphlets, or other written materials from your doctor or pharmacy? 5 - Always   What is the last grade level you completed in school? 12th grade     Language Assistant   Interpreter Needed? No     Comments   Information entered by : Gayland Curry, DO      Past Medical History:  Diagnosis Date  . Benign essential hypertension   . Dementia in Parkinson's disease ([place])   . Depression   . History of necrotizing fasciitis    left leg, s/p debridement and graft  . Hypothyroidism   . Osteoarthritis, generalized   . Parkinson disease ([place])   . Spinal stenosis    Past Surgical History:  Procedure Laterality Date  . ABDOMINAL HYSTERECTOMY  1977  . SKIN DEBRIDEMENT  2014   Brambhelt, MD  . SKIN GRAFT  2014   [place]  2006   spinal stenosis    reports that she has never smoked. She has never used smokeless tobacco. She reports that she does not drink alcohol or use drugs.  Functional Status Survey: Is the patient deaf or have difficulty hearing?: No Does the patient have difficulty seeing, even when wearing glasses/contacts?: Yes Does the patient have difficulty concentrating, remembering, or making decisions?: Yes Does the patient have difficulty walking or climbing stairs?: Yes Does the patient have difficulty dressing or bathing?: Yes Does the patient have difficulty doing errands alone such as visiting a doctor's office or shopping?: Yes  Family History  Problem Relation Age of Onset  . Heart disease Mother   . Heart disease Sister   . Hypertension Sister   . Stroke Sister   . Heart disease Sister     heart attack  . Cancer Sister     colon    Health Maintenance  Topic Date Due  . TETANUS/TDAP  02/28/1950  . ZOSTAVAX  03/01/1991  . DEXA SCAN  02/29/1996  . INFLUENZA VACCINE  Completed  . PNA vac Low Risk Adult  Completed    Allergies  Allergen Reactions  . Piperacillin-Tazobactam In Dex Rash    Unclear  whether associated with clindamycin or zosyn, but probably more likely to be zosyn.   [name] Dye [Iodinated Diagnostic Agents]     Only when intravenous, not on external skin.  . Clindamycin Rash    Unclear whether patient has an actual allergy to clindamycin. On beta-lactam at the same time.    Allergies as of 02/18/2016      Reactions   Piperacillin-tazobactam In Dex Rash   Unclear whether associated with clindamycin or zosyn, but probably more likely to be zosyn.    Ivp Dye [iodinated Diagnostic Agents]    Only when intravenous, not on external skin.   Clindamycin Rash   Unclear whether patient has an actual allergy to clindamycin. On beta-lactam at the same time.  Medication List       Accurate as of 02/18/16  9:49 AM. Always use your most recent med list.          acetaminophen 500 MG tablet Commonly known as:  TYLENOL Take 500 mg by mouth every 6 (six) hours as needed for mild pain.   aspirin EC 81 MG tablet Take 81 mg by mouth daily.   carbidopa-levodopa 50-200 MG tablet Commonly known as:  SINEMET CR Take 1 tablet by mouth at bedtime.   carbidopa-levodopa 25-100 MG disintegrating tablet Commonly known as:  PARCOPA Take 1 tablet by mouth 2 (two) times daily as needed.   carbidopa-levodopa 25-100 MG tablet Commonly known as:  SINEMET IR Take 1.5 pills at 8 am and 12 and 2 pills at 4 pm and 2 pills at 8 pm.   entacapone 200 MG tablet Commonly known as:  COMTAN Take 1 tablet (200 mg total) by mouth 4 (four) times daily. In lieu of stalevo, take with Sinemet 4 times a day Bridging Rx for mailorder   entacapone 200 MG tablet Commonly known as:  COMTAN Take 1 tablet (200 mg total) by mouth 4 (four) times daily. take with Sinemet 4 times a day, bridging Rx for delay in mailorder   escitalopram 10 MG tablet Commonly known as:  LEXAPRO Take 1 tablet (10 mg total) by mouth daily.   gabapentin 100 MG capsule Commonly known as:  NEURONTIN Take 2 capsules (200 mg  total) by mouth at bedtime.   hydrALAZINE 25 MG tablet Commonly known as:  APRESOLINE Take one tablet by mouth three times daily   levothyroxine 25 MCG tablet Commonly known as:  SYNTHROID, LEVOTHROID Take one tablet by mouth once daily 30 minutes before breakfast for thyroid   metoprolol succinate 25 MG 24 hr tablet Commonly known as:  TOPROL-XL TAKE 2 TABLETS BY MOUTH TWICE DAILY   Vitamin D3 2000 units Tabs Take 2,000 Units by mouth daily.       Review of Systems  Constitutional: Negative for chills, fever and malaise/fatigue.  HENT: Negative for hearing loss.   Eyes: Positive for blurred vision.  Respiratory: Positive for cough and wheezing. Negative for shortness of breath.        When going to bed at night; has chronic dyspnea on exertion  Cardiovascular: Negative for chest pain, palpitations and leg swelling.  Gastrointestinal: Negative for abdominal pain and constipation.  Genitourinary: Negative for dysuria.  Musculoskeletal: Positive for back pain and joint pain. Negative for falls and myalgias.  Skin: Negative for itching and rash.  Neurological: Positive for tingling, tremors, sensory change and weakness. Negative for dizziness and loss of consciousness.       Parkinson's  Endo/Heme/Allergies: Bruises/bleeds easily.  Psychiatric/Behavioral: Positive for memory loss. Negative for depression.    Vitals:   02/18/16 0912  BP: 120/60  Pulse: 70  Temp: 98 F (36.7 C)  TempSrc: Oral  SpO2: 96%  Weight: 211 lb (95.7 kg)  Height: 5\' 5"  (1.651 m)   Body mass index is 35.11 kg/m. Physical Exam  Constitutional: She appears well-developed and well-nourished. No distress.  HENT:  Head: Normocephalic and atraumatic.  Right Ear: External ear normal.  Left Ear: External ear normal.  Nose: Nose normal.  Mouth/Throat: Oropharynx is clear and moist. No oropharyngeal exudate.  Eyes: Conjunctivae and EOM are normal. Pupils are equal, round, and reactive to light.    Neck: Normal range of motion. Neck supple. No JVD present. No thyromegaly present.  Cardiovascular: Normal  rate, regular rhythm, normal heart sounds and intact distal pulses.   Pulmonary/Chest: Effort normal and breath sounds normal. No respiratory distress. She has no wheezes. She has no rales. She exhibits no tenderness.  Abdominal: Soft. Bowel sounds are normal.  Musculoskeletal: She exhibits tenderness.  Of lower back  Lymphadenopathy:    She has no cervical adenopathy.  Neurological: She is alert. A sensory deficit is present. No cranial nerve deficit. She exhibits abnormal muscle tone.  Oriented to person and place, some decreased postural support  Skin: Skin is warm and dry. Capillary refill takes less than 2 seconds.  Psychiatric:  Chronic masked facies    Labs reviewed: Basic Metabolic Panel:  Recent Labs  04/16/15 1554 10/15/15 0922  NA 145* 142  K 4.5 4.2  CL 106 110  CO2 24 26  GLUCOSE 112* 109*  BUN 22 16  CREATININE 1.29* 1.21*  CALCIUM 9.7 9.8   Liver Function Tests:  Recent Labs  10/15/15 0922  AST 11  ALT 4*  ALKPHOS 62  BILITOT 1.0  PROT 6.2  ALBUMIN 3.9   No results for input(s): LIPASE, AMYLASE in the last 8760 hours. No results for input(s): AMMONIA in the last 8760 hours. CBC:  Recent Labs  10/15/15 0922  WBC 6.1  NEUTROABS 3,843  HGB 16.9*  HCT 50.0*  MCV 93.5  PLT 161   Cardiac Enzymes: No results for input(s): CKTOTAL, CKMB, CKMBINDEX, TROPONINI in the last 8760 hours. BNP: Invalid input(s): POCBNP Lab Results  Component Value Date   HGBA1C 5.5 10/15/2015   Lab Results  Component Value Date   TSH 5.06 (H) 10/15/2015   Assessment/Plan 1. Medicare annual wellness visit, subsequent -completed today, see above  2. Annual physical exam -completed today, pt refused breast exam due to back hurting in supine position, no longer does mammograms at 86 either  3. Acute upper respiratory infection -nearly resolved with some  residual wheezing at hs--advised use of vaporizer, cont cough syrup as needed at bedtime  4. Chronic diastolic CHF (congestive heart failure) (HCC) -has been stable, actually down a few lbs since last weight check -no rales or jvd; has chronic DOE from this and her deconditioning  5. Parkinson disease ([place]) -cont current therapy, uses lift chair, has caregiver and walks with rollator walker, see function above  6. Dementia due to Parkinson's disease without behavioral disturbance ([place]) - MMSE - Mini Mental State Exam 02/18/2016 09/25/2015 06/28/2014  Orientation to time 2 2 3   Orientation to Place 2 2 2   Registration 3 3 3   Attention/ Calculation 0 2 1  Recall 3 0 0  Language- name 2 objects 2 2 2   Language- repeat 1 1 1   Language- follow 3 step command 3 3 2   Language- read & follow direction 1 1 1   Write a sentence 0 1 0  Copy design 0 0 0  Total score 17 17 15   stable since last eval  7. Obesity, Class II, BMI 35.0-39.9, with comorbidity (see actual BMI) -ongoing, cont use of foot pedals to exercise, but gradually increase time (no. of days) as tolerated  8. Hypothyroidism, unspecified type - cont levothyroxine at current dose until f/u TSH returns - TSH today   Labs/tests ordered:  Orders Placed This Encounter  Procedures  . TSH   Also will plan to check hba1c, cmp with gfr and cbc with diff next time at appt so advised to come fasting  Jasmeet Gehl L. Geri Hepler, D.O. [facility] [address],  [address] Cell Phone (Mon-Fri 8am-5pm):  [phone] On Call:  [phone] & follow prompts after 5pm & weekends Office Phone:  [phone] Office Fax:  [phone]

## 2016-02-19 ENCOUNTER — Telehealth: Payer: Self-pay

## 2016-02-19 ENCOUNTER — Ambulatory Visit (INDEPENDENT_AMBULATORY_CARE_PROVIDER_SITE_OTHER): Payer: Medicare Other | Admitting: Neurology

## 2016-02-19 ENCOUNTER — Encounter: Payer: Self-pay | Admitting: Neurology

## 2016-02-19 ENCOUNTER — Encounter: Payer: Self-pay | Admitting: *Deleted

## 2016-02-19 VITALS — BP 156/82 | HR 82 | Resp 16 | Ht 65.0 in | Wt 206.0 lb

## 2016-02-19 DIAGNOSIS — F419 Anxiety disorder, unspecified: Secondary | ICD-10-CM

## 2016-02-19 DIAGNOSIS — R29898 Other symptoms and signs involving the musculoskeletal system: Secondary | ICD-10-CM

## 2016-02-19 DIAGNOSIS — G20 Parkinson's disease: Secondary | ICD-10-CM | POA: Diagnosis not present

## 2016-02-19 DIAGNOSIS — F0280 Dementia in other diseases classified elsewhere without behavioral disturbance: Secondary | ICD-10-CM | POA: Diagnosis not present

## 2016-02-19 DIAGNOSIS — G3183 Dementia with Lewy bodies: Secondary | ICD-10-CM | POA: Diagnosis not present

## 2016-02-19 MED ORDER — ENTACAPONE 200 MG PO TABS: 200.0000 mg | ORAL_TABLET | Freq: Four times a day (QID) | ORAL | 3 refills | Status: DC

## 2016-02-19 MED ORDER — ESCITALOPRAM OXALATE 10 MG PO TABS: 10.0000 mg | ORAL_TABLET | Freq: Every day | ORAL | 3 refills | Status: DC

## 2016-02-19 MED ORDER — CARBIDOPA-LEVODOPA ER 50-200 MG PO TBCR: 1.0000 | EXTENDED_RELEASE_TABLET | Freq: Every day | ORAL | 3 refills | Status: DC

## 2016-02-19 MED ORDER — CARBIDOPA-LEVODOPA 25-100 MG PO TBDP: 1.0000 | ORAL_TABLET | Freq: Two times a day (BID) | ORAL | 3 refills | Status: DC | PRN

## 2016-02-19 NOTE — Patient Instructions (Addendum)
  Constipation can be a big problem in advanced Parkinson's disease. It is important to be proactive: this includes ensuring adequate water intake and mobilization (walking around), utilizing stool softeners as needed, an over-the-counter laxative as needed up to daily if needed, adding a probiotic in pill form or in the form of yogurt can help as well. Sometimes, using a suppository or enema becomes necessary.  I will write for a lift chair. I will keep your medications the same. We will try without the gabapentin, as it doesn't seem to help.  It is critical, that you stay well hydrated with water.

## 2016-02-19 NOTE — Telephone Encounter (Signed)
I sent a message to [facility] at [facility] to let her know that we have placed orders for patient to get a lift chair.

## 2016-02-19 NOTE — Progress Notes (Signed)
Subjective:    Patient ID: Andrea Dorsey is a 83 y.o. female.  HPI     Interim history:   Andrea Dorsey is a very pleasant 83 year old right-handed woman with an underlying complex medical history of spinal stenosis, necrotizing fasciitis, status post debridement and grafting on the right lower leg, lower extremity edema, insomnia, hypothyroidism, osteoarthritis, hypertension, depression, and obesity, who presents for followup consultation of her advanced, right-sided predominant Parkinson's disease, complicated by hallucinations, memory loss, mood disorder including anxiety and depression, sleep disorder including insomnia, OSA and RBD and advancing age. She is accompanied by her daughter again today. I last saw her on 09/25/15, at which time she reported doing okay. Her sleep was a little better per daughter. Her daughter had to reduce her work hours to 30 hours per week to be able to take better care of her mom. Patient was taking Sinemet 2 pills alternating with 1-1/2 pills. She was taking Parcopa as needed. She was complaining of her legs feeling heavy. She was on Lexapro generic 10 mg daily with success. She was taking gabapentin 200 mg at night. She had not tried BuSpar as yet. Her memory was a little worse per daughter. Patient needed more verbal cues and guidance. She did not have significant constipation. She had 24-7 supervision. She had no recent falls thankfully.  Today, 02/19/2016: She reports feeling weaker in her legs. She has trouble standing up sometimes, mornings are worse than evening's, daughter reports that the gabapentin does not seem to help very much, she is not sleeping very well and gabapentin has not changed very much at all. She takes 2 pills at night. She has thankfully not fallen. They have a lift chair at the house which the daughter had to purchase herself, this is the fourth lift chair, it is not working properly any longer and she is requesting a prescription for a lift  chair which I would be happy to provide. Patient has trouble standing from the seated position and this has become worse with time, her complicating factors for this include having lower extremity edema, being overweight, having spinal stenosis and osteoarthritis and of course advancing Parkinson's disease. She has issues with constipation, normally is reasonably well controlled with prune juice and as needed laxative but recently went for 5 days without a bowel movement but finally had success with warm prune juice. They had to cancel an appointment for 01/28/2016. Overall, per daughter, motor-wise, she seems stable, memory wise she seems stable, still has issues with anxiety off and on, no sustained depression at this time, stable on Lexapro.   Previously (copied from prior notes for reference):   I saw her on 05/08/2015, at which time she reported still having trouble sleeping at night, her legs would feel heavy, she had trouble moving altogether. We have changed her Stalevo to generic Sinemet and entacapone secondary to cost. She was taking Sinemet 1-1/2 pills 4 times a day and the entacapone 200 mg strength one pill 4 times a day. She had not tried the BuSpar. She had residual anxiety. She was on Lexapro. She has caretakers during the day but daughter felt overwhelmed and has to help out a lot over the weekend 2 and works full-time as well. Daughter had noticed more freezing spells in the afternoons. I suggested she start a trial of low-dose BuSpar. We also increased her Sinemet to alternate 1-1/2 pills with 2 pills, we kept the entacapone the same.   I saw her on 11/15/2014 at which  time she had more numbness in her legs. She has more difficulty with bladder control. She had thankfully not fallen and anxiety was stable. She was using her walker. I suggested we continue with Stalevo 150 mg 4 times a day. She had problems with her doses and other medication intolerances in the past.   I saw her on  06/28/2014, at which time her daughter reported that things were fairly stable. Patient was reporting a cough at night. She was taking cough medicine daily. She reported postnasal drip and drooling at night. Her speech was softer. She was walking with a walker and thankfully had not fallen recently. Her appetite was good. She needed more assistance. She had a lift chair. She was on Stalevo 1 pill 4 times a day at 4 hourly intervals. She was on Sinemet CR at night. They never tried the BuSpar as the anxiety episodes or spells subsided. Her daughter suspected that these spells were in the context of a certain caretaker that cause stress to the patient. She was on Lexapro 10 mg daily. Her daughter requested FMLA paperwork so she could make it to the appointments and also be able to go home in an emergency situation.   I saw her on 03/16/2014, at which time her daughter reported ongoing issues with spells of decreased alertness. She would not lose full consciousness but became listless and limp. She would not follow commands. The duration would be minutes to maybe 2 hours. Checking blood pressure at this times showed that she had some high blood pressure at the time. There was no convulsion, no tongue bite, no full loss of consciousness, no loss of bowel or bladder control except for one time when she did wet herself. There was no time predilection. I suggested we continue with Lexapro at 10 mg and add low-dose BuSpar for anxiety. We have done workup in the form of brain scans and EEG in the recent past. She was no longer on Parcopa as it did not help. She was not sleeping well. We kept the Sinemet CR the same at night.   I saw her on 12/07/2013, at which time I talked to the patient's daughter, Andrea Dorsey, separately before seeing the patient. Her daughter was concerned about the patient's general decline. She had more dream enactments. The patient felt that she was getting worse and that she could not sleep at night.  She was dozing off during the day. She had not fallen thankfully. She could not tolerate clonazepam. She also had side effects on low-dose Seroquel. Melatonin at 10 mg at night did not seem to help. She was not drinking enough water. She had moved to a new home and had 3 caretakers taking turns and she was not without supervision. On the weekends her family will take turns supervising and helping. I increased her Lexapro and kept her other medications the same.    I saw her on 09/07/13, at which time her daughter reported no recent staring or zoning out spells. She was in the process of moving to a townhome with a caretaker for 4 days and 3 nights and family staying with her the rest of the time. I ordered an EEG. I also asked her to take Sinemet CR at night to get her through the night a little bit better. I started her on a low-dose Lexapro 5 mg strength. Her EEG on 09/22/2013 was reported as normal in the awake state. Her daughter requested a sooner appointment for more hallucinations and  confusion. She had more memory loss.    I saw her on 06/08/2013, at which time I suggested she continue with Stalevo 150 4 times a day. She previously has had hallucinations with increased doses. I suggested adding a little extra levodopa in the form of Parcopa half a pill up to twice daily as needed. This was supposed to help with freezing. In the interim, about a month later on 07/13/2013 she was seen by Charlott Holler, NP for a sooner than scheduled appointment at which time the Parcopa dose was increased to one whole pill up to twice daily for rigidity and freezing. She presented to the emergency room on 07/15/2013 with generalized complaint of weakness and freezing and was advised to increase her Parcopa. She presented voice recently this month to the emergency room with altered sensorium, or staring spells. Workup with head CT and MRIs were negative. It was felt that these were related to Parkinson's disease. She had a brain  MRI and MRA without contrast on 09/03/2013:No acute intracranial abnormality. 2. Cerebral atrophy and single remote microhemorrhage in the left frontal lobe. 3. Unremarkable head MRA. In addition, have reviewed the images through the PACS system. She had head CT without contrast on 09/03/2013:No acute intracranial pathology seen on CT. 2. Inspissated mucus filling the left maxillary sinus, and mild partial opacification of the mastoid air cells bilaterally. She had head CT without contrast on 09/01/2013: No acute intracranial pathology seen on CT. 2. Mild cortical volume loss noted. 3. Mild partial opacification of the mastoid air cells bilaterally. In addition, reviewed the images through the PACS system.   I saw her on 02/03/13, at which time I increased her Stalevo to 5 times a day. We talked about potential side effects. I asked her to stop the Seroquel and started her on low-dose clonazepam for insomnia and RBD. In the interim she was seen by our nurse practitioner, Ms. Lam on 04/08/2013, at which time I also saw her, and we mutually decided to decrease Stalevo back to 4 times a day because of worsening confusion and hallucinations. Her clonazepam was discontinued by her PCP because of side effects. I suggested a trial of melatonin, 5-10 mg. We checked some labs including CBC, CMP, CRP, ESR and urinalysis. Labs showed no significant abnormalities, mild but stable kidney impairment was noted. She was encouraged to drink more water. She has seen her PCP in April 2015 and was advised to take her fluid pill as needed, an increase of melatonin to 10 mg.   I first met her on 10/15/2012, at which time a continued her Stalevo. I suggested a small dose of Seroquel to help her sleep and tone down the REM behavior disorder and encouraged him to discuss with her primary care physician the addition of an antidepressant. She presents with a complaint of worsening tremors.   She has been in [facility] [address]  since 10/18/12. She has a Hx of vivid dreams and tends to act out in her sleep.   She previously used to see a neurologist at [facility], when she lived in [place], [place]. She was diagnosed with PD about 12 years ago when she was still residing in [place]. She needs assistance with her ADLs. She has been living with her daughter and son-in-law and they have looked into the possibility of a long-term care facility but the patient has been resistant. She has had problems at night including sundowning, confusion, inability to sleep.  Her symptoms started on one side with tremors, but the patient was not sure which side. She has been on Stalevo for the past 2 years, and prior to that she was on C/L, and prior to that she was on Amantadine. She may not have tried a dopamine agonist or rasagiline in the past. She has been experiencing nausea with her PD medications and still has occasional nausea. In March 2014 she developed necrotizing fasciitis and needed debridement and grafting. She developed hallucinations at the time, but was on pain medications at the time, but the [facility] persisted beyond that. She also started having memory loss then. She developed cellulitis with complications in her jaw and needed all remaining teeth removed and had IV antibiotics in mid-2014. She has no FHx of PD or dementia. She has no Hx of psychiatric premorbid illness. In 2006 she had back surgery. She has no exposure to chemicals, or agent orange. She has been an anxious person. She is not able to sleep at night. She was tried on Ambien and amitriptyline. She has difficulty with sleep onset and sleep maintenance.   She has occasional urinary incontinence, occasional constipation. She snores, and needed to have a sleep study, but did not go. She has had some dream enactments and has slid out of bed.   She had been very opposed to going into assisted living, but understood that she given her complex medical history and multiple  issues and advanced Parkinson's disease she was no longer safe to live by herself.  Her Past Medical History Is Significant For: Past Medical History:  Diagnosis Date  . Benign essential hypertension   . Dementia in Parkinson's disease ([place])   . Depression   . History of necrotizing fasciitis    left leg, s/p debridement and graft  . Hypothyroidism   . Osteoarthritis, generalized   . Parkinson disease ([place])   . Spinal stenosis     Her Past Surgical History Is Significant For: Past Surgical History:  Procedure Laterality Date  . ABDOMINAL HYSTERECTOMY  1977  . SKIN DEBRIDEMENT  2014   [place], MD  . SKIN GRAFT  2014   Brambhelt MD  . SPINE SURGERY  2006   spinal stenosis    Her Family History Is Significant For: Family History  Problem Relation Age of Onset  . Heart disease Mother   . Heart disease Sister   . Hypertension Sister   . Stroke Sister   . Heart disease Sister     heart attack  . Cancer Sister     colon    Her Social History Is Significant For: Social History   Social History  . Marital status: Widowed    Spouse name: N/A  . Number of children: 2  . Years of education: 12   Occupational History  .      retired    Social History Main Topics  . Smoking status: Never Smoker  . Smokeless tobacco: Never Used  . Alcohol use No  . Drug use: No  . Sexual activity: Not Currently   Other Topics Concern  . Not on file   Social History Narrative   Patient lives in assisted living.patient is right handed    Her Allergies Are:  Allergies  Allergen Reactions  . Piperacillin-Tazobactam In Dex Rash    Unclear whether associated with clindamycin or zosyn, but probably more likely to be zosyn.   [name] Dye [Iodinated Diagnostic Agents]     Only when intravenous, not  on external skin.  . Clindamycin Rash    Unclear whether patient has an actual allergy to clindamycin. On beta-lactam at the same time.  :   Her Current Medications Are:  Outpatient  Encounter Prescriptions as of 02/19/2016  Medication Sig  . acetaminophen (TYLENOL) 500 MG tablet Take 500 mg by mouth every 6 (six) hours as needed for mild pain.   Marland Kitchen aspirin EC 81 MG tablet Take 81 mg by mouth daily.  . carbidopa-levodopa (PARCOPA) 25-100 MG disintegrating tablet Take 1 tablet by mouth 2 (two) times daily as needed.  . carbidopa-levodopa (SINEMET CR) 50-200 MG tablet Take 1 tablet by mouth at bedtime.  . carbidopa-levodopa (SINEMET IR) 25-100 MG tablet Take 1.5 pills at 8 am and 12 and 2 pills at 4 pm and 2 pills at 8 pm.  . Cholecalciferol (VITAMIN D3) 2000 UNITS TABS Take 2,000 Units by mouth daily.   . entacapone (COMTAN) 200 MG tablet Take 1 tablet (200 mg total) by mouth 4 (four) times daily. In lieu of stalevo, take with Sinemet 4 times a day Bridging Rx for mailorder  . entacapone (COMTAN) 200 MG tablet Take 1 tablet (200 mg total) by mouth 4 (four) times daily.  Marland Kitchen escitalopram (LEXAPRO) 10 MG tablet Take 1 tablet (10 mg total) by mouth daily.  Marland Kitchen gabapentin (NEURONTIN) 100 MG capsule Take 2 capsules (200 mg total) by mouth at bedtime.  . hydrALAZINE (APRESOLINE) 25 MG tablet Take one tablet by mouth three times daily  . levothyroxine (SYNTHROID, LEVOTHROID) 25 MCG tablet Take one tablet by mouth once daily 30 minutes before breakfast for thyroid  . metoprolol succinate (TOPROL-XL) 25 MG 24 hr tablet TAKE 2 TABLETS BY MOUTH TWICE DAILY  . [DISCONTINUED] carbidopa-levodopa (PARCOPA) 25-100 MG disintegrating tablet Take 1 tablet by mouth 2 (two) times daily as needed.  . [DISCONTINUED] carbidopa-levodopa (SINEMET CR) 50-200 MG tablet Take 1 tablet by mouth at bedtime.  . [DISCONTINUED] entacapone (COMTAN) 200 MG tablet Take 1 tablet (200 mg total) by mouth 4 (four) times daily. take with Sinemet 4 times a day, bridging Rx for delay in mailorder  . [DISCONTINUED] escitalopram (LEXAPRO) 10 MG tablet Take 1 tablet (10 mg total) by mouth daily.   No facility-administered  encounter medications on file as of 02/19/2016.   :  Review of Systems:  Out of a complete 14 point review of systems, all are reviewed and negative with the exception of these symptoms as listed below: Review of Systems  Neurological:       Daughter reports that patient seems to have intermittent bilateral leg weakness. Also appears to have increased stiffness in legs. Daughter feels that the Gabapentin is not helping the patient.  Weakness occurs mostly in the morning.    Objective:  Neurologic Exam  Physical Exam Physical Examination:   Vitals:   02/19/16 0958  BP: (!) 156/82  Pulse: 82  Resp: 16   General Examination: The patient is a very pleasant 83 y.o. female in no acute distress. She appears mildly deconditioned, well groomed   HEENT: Normocephalic, atraumatic, pupils are equal, round and reactive to light and accommodation, she has difficulty with tracking, moderately impaired, no nystagmus noted. She has limitation to upgaze. She has decrease in eye blink rate, hearing is impaired bilaterally, stable. She has moderate facial masking with normal facial sensation, mild intermittent lower lip and jaw tremor, neck is moderately rigid, normal passive range of motion, decrease in active range of motion, oropharynx exam reveals moderate  mouth dryness, no significant drooling, no significant airway crowding. Speech is edentulous and moderately hypophonic. Mallampati is class III. She has normal tongue and palate movements.  Chest: Clear to auscultation without wheezing, rhonchi or crackles noted.  Heart: S1+S2+0, regular and normal without murmurs, rubs or gallops noted.   Abdomen: Soft, non-tender and non-distended with normal bowel sounds appreciated on auscultation.  Extremities: There is 1+ pitting edema in the distal lower extremities bilaterally. Chronic hyperpigmentation, skin grafting.  Skin: Warm and dry without trophic changes noted.   Musculoskeletal: exam reveals no  obvious joint deformities, tenderness or joint swelling or erythema, but she reports diffuse mild leg pain and bilateral leg weakness.    Neurologically:  Mental status: The patient is awake, alert and is good attention today. She is able to provide some of her history in terms of complaints and explanations but not able to provide detailed history which her daughter provides today. She is oriented to month, day, city and situation. Her memory, attention, language and knowledge are  globally impaired. She has no difficulty naming, she has moderate bradyphrenia. mood and affect are normal today. She does not appear to be as anxious as typically.   On 12/07/2013: MMSE 17/30, CDT: 3/4, AFT: 5/min  On 06/28/2014: MMSE: 15/30, CDT: 2/4, AFT: 11/min.   On 09/25/2015: MMSE: 17/30, CDT: 1/4, AFT: 7/min.  On 02/19/2016: MMSE: 16/30, CDT: 2/4, AFT: 6/min.   Cranial nerves are as described above under HEENT exam. In addition,  she has a mildly weak shoulder shrug bilaterally.   Motor exam:  She has a mildly thin bulk and strength is globally 5 minus out 5, slightly weaker in hip flexion bilaterally, no dyskinesias are noted. She has mild rigidity noted in the right upper extremity. She has an intermittent resting tremor in both upper extremities, left more than right. On fine motor skills testing she has difficulty with all fine motor skills in the moderate range,  She has moderate bradykinesia, Romberg is not testable safely. Her resting tremor is more pronounced on the right than left. Reflexes are 1+ in the upper extremities, trace in both knees and absent in both ankles. Fine motor difficulties are more pronounced on the right.  Cerebellar testing shows  no dysmetria or intention tremor, she has a tendency to lean slightly to the right when sitting.  Sensory exam is intact to light touch throughout.    Gait, station and balance: She  stands with significant difficulty and needs assistance, she has a  moderately stooped posture, stands wide-based, is able to maneuver her rolling walker fairly well but is slightly more unstable today. She has no freezing spell but balance is impaired. She feels weaker in her legs and is somewhat afraid to walk for me today. We offered her a wheelchair but she was able to walk with her walker.   Assessment and Plan:   In summary, Andrea Dorsey is a very pleasant 83 year old female with an underlyingComplex medical history of spinal stenosis, leg swelling, history of cellulitis of the jaw and also necrotizing fasciitis of the leg with status post skin grafting of the right leg, hypertension, depression, anxiety, osteoarthritis, and hypothyroidism as well as overweight state, who presents for follow-up consultation of her advanced right-sided predominant Parkinson's disease, complicated by chronic constipation, dementia, history of RBD, deconditioning, global weakness, anxiety, depression, and overall aging. Thankfully, she has ongoing help with caregivers and her daughter has been taking care of her. She still response to medication. I  suggested we continue with Sinemet at the current dose which is alternating 2 pills with 1-1/2 pills as well as Comtan for 4 doses a day. She is on Lexapro and we will continue with this as well. We will phase out gabapentin as it has not added any benefit to help her sleep at night. She is advised that I will prescribe a lift chair as she has difficulty standing from the seated position and needs significant assistance and she also has a history of freezing spells. She has issues with constipation for which I explained to them they need to be very proactive, thankfully, on a day-to-day basis she has reasonably good results with over-the-counter medication and prune juice. She is strongly advised to stay well-hydrated with water and to keep moving with the help of her walker. Memory scores have with time declined but are fairly stable today. We  will continue to monitor these. Unfortunately, she has a history of difficulty tolerating some medications. We had to switch to generic medication from Stalevo due to cost. Previous workup has included EEG and brain MRI and MRA from August 2015. Andrea Dorsey has FMLA  in order to bring her to her appointments and for emergencies when she may have to leave work ad hoc. She has also reduced her work hours to 30 hours per week. In the past, she was on Ambien, amitriptyline, amantadine, clonazepam, and Seroquel at different times with intolerance to these medications. An increase in Stalevo in the past resulted in increase in hallucinations, so we have to be mindful of this when considering future medication changes. I suggested a four-month checkup, sooner as needed. I answered all their questions today and the patient and her daughter were in agreement. I spent 30 minutes in total face-to-face time with the patient, more than 50% of which was spent in counseling and coordination of care, reviewing test results, reviewing medication and discussing or reviewing the diagnosis of PD and its associated complications, its prognosis and treatment options. Pertinent laboratory and imaging test results that were available during this visit with the patient were reviewed by me and considered in my medical decision making (see chart for details).

## 2016-03-18 ENCOUNTER — Other Ambulatory Visit: Payer: Self-pay | Admitting: Internal Medicine

## 2016-04-13 ENCOUNTER — Other Ambulatory Visit: Payer: Self-pay | Admitting: Internal Medicine

## 2016-04-14 ENCOUNTER — Ambulatory Visit: Payer: Medicare Other | Admitting: Neurology

## 2016-04-19 ENCOUNTER — Telehealth: Payer: Self-pay | Admitting: Neurology

## 2016-04-19 NOTE — Telephone Encounter (Signed)
I received call from patient`s daughter stating she has not been able to wake her up since 8 am, BP and resp  was fine. This has happened once before but lasted only 1 hour. I recommended her to bring her to [facility] for evaluation.She voiced understanding

## 2016-04-22 ENCOUNTER — Telehealth: Payer: Self-pay | Admitting: Neurology

## 2016-04-22 NOTE — Telephone Encounter (Addendum)
Pt's daughter called said she did not take her to the ED as Dr Leonie Man suggested on 04/19/16 per telephone note. Said the patient stayed in the unresponsive state for about 3-1/2 hours. When she came out of it she was a little more confused and lethargic than normal. Daughter said by Sunday she back to her normal self. Daughter would like for her to be worked in as next available appt for Dr Rexene Alberts is in June.

## 2016-04-23 NOTE — Telephone Encounter (Signed)
I called pt's daughter, [name], and offered her a sooner appt of 05/01/2016 at 3:00pm with [name]. Pt's daughter verbalized understanding of appt date and time.

## 2016-04-29 ENCOUNTER — Other Ambulatory Visit: Payer: Self-pay | Admitting: Neurology

## 2016-04-29 DIAGNOSIS — G20 Parkinson's disease: Secondary | ICD-10-CM

## 2016-05-01 ENCOUNTER — Encounter (INDEPENDENT_AMBULATORY_CARE_PROVIDER_SITE_OTHER): Payer: Self-pay

## 2016-05-01 ENCOUNTER — Encounter: Payer: Self-pay | Admitting: Neurology

## 2016-05-01 ENCOUNTER — Ambulatory Visit (INDEPENDENT_AMBULATORY_CARE_PROVIDER_SITE_OTHER): Payer: Medicare Other | Admitting: Neurology

## 2016-05-01 VITALS — BP 199/97 | HR 74 | Ht 65.0 in | Wt 209.0 lb

## 2016-05-01 DIAGNOSIS — R29898 Other symptoms and signs involving the musculoskeletal system: Secondary | ICD-10-CM | POA: Diagnosis not present

## 2016-05-01 DIAGNOSIS — F419 Anxiety disorder, unspecified: Secondary | ICD-10-CM

## 2016-05-01 DIAGNOSIS — G20 Parkinson's disease: Secondary | ICD-10-CM | POA: Diagnosis not present

## 2016-05-01 DIAGNOSIS — F0280 Dementia in other diseases classified elsewhere without behavioral disturbance: Secondary | ICD-10-CM | POA: Diagnosis not present

## 2016-05-01 DIAGNOSIS — G3183 Dementia with Lewy bodies: Secondary | ICD-10-CM | POA: Diagnosis not present

## 2016-05-01 DIAGNOSIS — R6889 Other general symptoms and signs: Secondary | ICD-10-CM

## 2016-05-01 MED ORDER — CARBIDOPA-LEVODOPA ER 50-200 MG PO TBCR: 1.0000 | EXTENDED_RELEASE_TABLET | Freq: Every day | ORAL | 3 refills | Status: DC

## 2016-05-01 NOTE — Patient Instructions (Signed)
I am not sure, what causes these spells of unresponsiveness.  There may be a chance, that this could be a seizure.  We will do an EEG (brainwave test), which we will schedule. We will call you with the results. We will continue with the PD meds.  We will do a 6 month FU.

## 2016-05-01 NOTE — Progress Notes (Signed)
Subjective:    Patient ID: Andrea Dorsey is a 83 y.o. female.  HPI     Interim history:   Andrea Dorsey is a very pleasant 83 year old right-handed woman with an underlying complex medical history of spinal stenosis, necrotizing fasciitis, status post debridement and grafting on the right lower leg, lower extremity edema, insomnia, hypothyroidism, osteoarthritis, hypertension, depression, and obesity, who presents for followup consultation of her advanced, right-sided predominant Parkinson's disease, complicated by hallucinations, memory loss, mood disorder including anxiety and depression, sleep disorder including insomnia, OSA and RBD, lethargy, and complications pertaining to deconditioning and advancing age. She is accompanied by her daughter again today. She presents for a sooner than scheduled visit because of episode of lethargy. The daughter called in the interim on 04/19/2016 due to patient being less responsive for an hour. She was advised to take patient to the emergency room. She did not do this. She called on 04/22/2016 to request a sooner appointment. I last saw her on 02/19/2016, at which time she reported feeling weaker in her legs. She was having trouble standing up. She felt that mornings are worse in evening's. Daughter reported that gabapentin did not help very much at night. She did not have any recent falls. Daughter requested a lift chair. She had constipation, which they tried to manage with prune juice and as needed laxative. She did go 5 days without a bowel movement recently. Patient was still having issues with anxiety off and on, no significant depression and stable on Lexapro.  Today, 05/01/2016 (all dictated new, as well as above notes, some dictation done in note pad or Word, outside of chart, may appear as copied):  She reports no pain. Her daughter reports, that a couple of weeks ago, she had a 4 hours spell of not responding to any stimuli, but BP was stable. No  convulsion, no twitching, no incontinence, no resistance, eyes were closed, breathing was regular, pulse was normal, on passive eye opening eyes were looking straight, eyeballs were not rolled up. Appetite is good, no recent problem with constipation.  The patient's allergies, current medications, family history, past medical history, past social history, past surgical history and problem list were reviewed and updated as  appropriate.   Previously (copied from previous notes for reference):   I saw her on 09/25/15, at which time she reported doing okay. Her sleep was a little better per daughter. Her daughter had to reduce her work hours to 30 hours per week to be able to take better care of her mom. Patient was taking Sinemet 2 pills alternating with 1-1/2 pills. She was taking Parcopa as needed. She was complaining of her legs feeling heavy. She was on Lexapro generic 10 mg daily with success. She was taking gabapentin 200 mg at night. She had not tried BuSpar as yet. Her memory was a little worse per daughter. Patient needed more verbal cues and guidance. She did not have significant constipation. She had 24-7 supervision. She had no recent falls thankfully.    I saw her on 05/08/2015, at which time she reported still having trouble sleeping at night, her legs would feel heavy, she had trouble moving altogether. We have changed her Stalevo to generic Sinemet and entacapone secondary to cost. She was taking Sinemet 1-1/2 pills 4 times a day and the entacapone 200 mg strength one pill 4 times a day. She had not tried the BuSpar. She had residual anxiety. She was on Lexapro. She has caretakers during the day but  daughter felt overwhelmed and has to help out a lot over the weekend 2 and works full-time as well. Daughter had noticed more freezing spells in the afternoons. I suggested she start a trial of low-dose BuSpar. We also increased her Sinemet to alternate 1-1/2 pills with 2 pills, we kept the  entacapone the same.   I saw her on 11/15/2014 at which time she had more numbness in her legs. She has more difficulty with bladder control. She had thankfully not fallen and anxiety was stable. She was using her walker. I suggested we continue with Stalevo 150 mg 4 times a day. She had problems with her doses and other medication intolerances in the past.   I saw her on 06/28/2014, at which time her daughter reported that things were fairly stable. Patient was reporting a cough at night. She was taking cough medicine daily. She reported postnasal drip and drooling at night. Her speech was softer. She was walking with a walker and thankfully had not fallen recently. Her appetite was good. She needed more assistance. She had a lift chair. She was on Stalevo 1 pill 4 times a day at 4 hourly intervals. She was on Sinemet CR at night. They never tried the BuSpar as the anxiety episodes or spells subsided. Her daughter suspected that these spells were in the context of a certain caretaker that cause stress to the patient. She was on Lexapro 10 mg daily. Her daughter requested FMLA paperwork so she could make it to the appointments and also be able to go home in an emergency situation.   I saw her on 03/16/2014, at which time her daughter reported ongoing issues with spells of decreased alertness. She would not lose full consciousness but became listless and limp. She would not follow commands. The duration would be minutes to maybe 2 hours. Checking blood pressure at this times showed that she had some high blood pressure at the time. There was no convulsion, no tongue bite, no full loss of consciousness, no loss of bowel or bladder control except for one time when she did wet herself. There was no time predilection. I suggested we continue with Lexapro at 10 mg and add low-dose BuSpar for anxiety. We have done workup in the form of brain scans and EEG in the recent past. She was no longer on Parcopa as it did not  help. She was not sleeping well. We kept the Sinemet CR the same at night.   I saw her on 12/07/2013, at which time I talked to the patient's daughter, [name], separately before seeing the patient. Her daughter was concerned about the patient's general decline. She had more dream enactments. The patient felt that she was getting worse and that she could not sleep at night. She was dozing off during the day. She had not fallen thankfully. She could not tolerate clonazepam. She also had side effects on low-dose Seroquel. Melatonin at 10 mg at night did not seem to help. She was not drinking enough water. She had moved to a new home and had 3 caretakers taking turns and she was not without supervision. On the weekends her family will take turns supervising and helping. I increased her Lexapro and kept her other medications the same.    I saw her on 09/07/13, at which time her daughter reported no recent staring or zoning out spells. She was in the process of moving to a townhome with a caretaker for 4 days and 3 nights and family  staying with her the rest of the time. I ordered an EEG. I also asked her to take Sinemet CR at night to get her through the night a little bit better. I started her on a low-dose Lexapro 5 mg strength. Her EEG on 09/22/2013 was reported as normal in the awake state. Her daughter requested a sooner appointment for more hallucinations and confusion. She had more memory loss.    I saw her on 06/08/2013, at which time I suggested she continue with Stalevo 150 4 times a day. She previously has had hallucinations with increased doses. I suggested adding a little extra levodopa in the form of Parcopa half a pill up to twice daily as needed. This was supposed to help with freezing. In the interim, about a month later on 07/13/2013 she was seen by Charlott Holler, NP for a sooner than scheduled appointment at which time the Parcopa dose was increased to one whole pill up to twice daily for rigidity and  freezing. She presented to the emergency room on 07/15/2013 with generalized complaint of weakness and freezing and was advised to increase her Parcopa. She presented voice recently this month to the emergency room with altered sensorium, or staring spells. Workup with head CT and MRIs were negative. It was felt that these were related to Parkinson's disease. She had a brain MRI and MRA without contrast on 09/03/2013:No acute intracranial abnormality. 2. Cerebral atrophy and single remote microhemorrhage in the left frontal lobe. 3. Unremarkable head MRA. In addition, have reviewed the images through the PACS system. She had head CT without contrast on 09/03/2013:No acute intracranial pathology seen on CT. 2. Inspissated mucus filling the left maxillary sinus, and mild partial opacification of the mastoid air cells bilaterally. She had head CT without contrast on 09/01/2013: No acute intracranial pathology seen on CT. 2. Mild cortical volume loss noted. 3. Mild partial opacification of the mastoid air cells bilaterally. In addition, reviewed the images through the PACS system.   I saw her on 02/03/13, at which time I increased her Stalevo to 5 times a day. We talked about potential side effects. I asked her to stop the Seroquel and started her on low-dose clonazepam for insomnia and RBD. In the interim she was seen by our nurse practitioner, Ms. Lam on 04/08/2013, at which time I also saw her, and we mutually decided to decrease Stalevo back to 4 times a day because of worsening confusion and hallucinations. Her clonazepam was discontinued by her PCP because of side effects. I suggested a trial of melatonin, 5-10 mg. We checked some labs including CBC, CMP, CRP, ESR and urinalysis. Labs showed no significant abnormalities, mild but stable kidney impairment was noted. She was encouraged to drink more water. She has seen her PCP in April 2015 and was advised to take her fluid pill as needed, an increase of melatonin  to 10 mg.   I first met her on 10/15/2012, at which time a continued her Stalevo. I suggested a small dose of Seroquel to help her sleep and tone down the REM behavior disorder and encouraged him to discuss with her primary care physician the addition of an antidepressant. She presents with a complaint of worsening tremors.   She has been in [facility] [address] since 10/18/12. She has a Hx of vivid dreams and tends to act out in her sleep.   She previously used to see a neurologist at [facility], when she lived in [place], [place]. She was diagnosed with PD about 12 years ago when she was still residing in [place]. She needs assistance with her ADLs. She has been living with her daughter and son-in-law and they have looked into the possibility of a long-term care facility but the patient has been resistant. She has had problems at night including sundowning, confusion, inability to sleep.   Her symptoms started on one side with tremors, but the patient was not sure which side. She has been on Stalevo for the past 2 years, and prior to that she was on C/L, and prior to that she was on Amantadine. She may not have tried a dopamine agonist or rasagiline in the past. She has been experiencing nausea with her PD medications and still has occasional nausea. In March 2014 she developed necrotizing fasciitis and needed debridement and grafting. She developed hallucinations at the time, but was on pain medications at the time, but the [facility] persisted beyond that. She also started having memory loss then. She developed cellulitis with complications in her jaw and needed all remaining teeth removed and had IV antibiotics in mid-2014. She has no FHx of PD or dementia. She has no Hx of psychiatric premorbid illness. In 2006 she had back surgery. She has no exposure to chemicals, or agent orange. She has been an anxious person. She is not able to sleep at night. She was tried on Ambien and amitriptyline. She has  difficulty with sleep onset and sleep maintenance.   She has occasional urinary incontinence, occasional constipation. She snores, and needed to have a sleep study, but did not go. She has had some dream enactments and has slid out of bed.   She had been very opposed to going into assisted living, but understood that she given her complex medical history and multiple issues and advanced Parkinson's disease she was no longer safe to live by herself.  Her Past Medical History Is Significant For: Past Medical History:  Diagnosis Date  . Benign essential hypertension   . Dementia in Parkinson's disease ([place])   . Depression   . History of necrotizing fasciitis    left leg, s/p debridement and graft  . Hypothyroidism   . Osteoarthritis, generalized   . Parkinson disease ([place])   . Spinal stenosis     Her Past Surgical History Is Significant For: Past Surgical History:  Procedure Laterality Date  . ABDOMINAL HYSTERECTOMY  1977  . SKIN DEBRIDEMENT  2014   [place], [place]  . SKIN GRAFT  2014   [place] MD  . SPINE SURGERY  2006   spinal stenosis    Her Family History Is Significant For: Family History  Problem Relation Age of Onset  . Heart disease Mother   . Heart disease Sister   . Hypertension Sister   . Stroke Sister   . Heart disease Sister     heart attack  . Cancer Sister     colon    Her Social History Is Significant For: Social History   Social History  . Marital status: Widowed    Spouse name: N/A  . Number of children: 2  . Years of education: 12   Occupational History  .      retired    Social History Main Topics  . Smoking status: Never Smoker  . Smokeless tobacco: Never Used  . Alcohol use No  . Drug use: No  . Sexual activity: Not Currently   Other Topics Concern  . None   Social  History Narrative   Patient lives in assisted living.patient is right handed    Her Allergies Are:  Allergies  Allergen Reactions  . Piperacillin-Tazobactam In Dex  Rash    Unclear whether associated with clindamycin or zosyn, but probably more likely to be zosyn.   [name] Dye [Iodinated Diagnostic Agents]     Only when intravenous, not on external skin.  . Clindamycin Rash    Unclear whether patient has an actual allergy to clindamycin. On beta-lactam at the same time.  :   Her Current Medications Are:  Outpatient Encounter Prescriptions as of 05/01/2016  Medication Sig  . acetaminophen (TYLENOL) 500 MG tablet Take 500 mg by mouth every 6 (six) hours as needed for mild pain.   Marland Kitchen aspirin EC 81 MG tablet Take 81 mg by mouth daily.  . carbidopa-levodopa (PARCOPA) 25-100 MG disintegrating tablet Take 1 tablet by mouth 2 (two) times daily as needed.  . carbidopa-levodopa (SINEMET CR) 50-200 MG tablet Take 1 tablet by mouth at bedtime.  . carbidopa-levodopa (SINEMET IR) 25-100 MG tablet Take 1.5 pills at 8 am and 12 and 2 pills at 4 pm and 2 pills at 8 pm.  . Cholecalciferol (VITAMIN D3) 2000 UNITS TABS Take 2,000 Units by mouth daily.   . entacapone (COMTAN) 200 MG tablet Take 1 tablet (200 mg total) by mouth 4 (four) times daily. In lieu of stalevo, take with Sinemet 4 times a day [facility] for mailorder  . entacapone (COMTAN) 200 MG tablet Take 1 tablet (200 mg total) by mouth 4 (four) times daily.  Marland Kitchen escitalopram (LEXAPRO) 10 MG tablet Take 1 tablet (10 mg total) by mouth daily.  . hydrALAZINE (APRESOLINE) 25 MG tablet Take one tablet by mouth three times daily  . levothyroxine (SYNTHROID, LEVOTHROID) 25 MCG tablet Take one tablet by mouth once daily 30 minutes before breakfast for thyroid  . metoprolol succinate (TOPROL-XL) 25 MG 24 hr tablet TAKE 2 TABLETS BY MOUTH TWICE DAILY  . [DISCONTINUED] carbidopa-levodopa (SINEMET CR) 50-200 MG tablet Take 1 tablet by mouth at bedtime.  . [DISCONTINUED] gabapentin (NEURONTIN) 100 MG capsule Take 2 capsules (200 mg total) by mouth at bedtime.   No facility-administered encounter medications on file as of  05/01/2016.   :  Review of Systems:  Out of a complete 14 point review of systems, all are reviewed and negative with the exception of these symptoms as listed below: Review of Systems  Neurological:       Pt presents today to discuss her PD. Pt had an episode recently where she was unresponsive but pt's daughter did not take her to the ED.     Objective:  Neurologic Exam  Physical Exam Physical Examination:   Vitals:   05/01/16 1513  BP: (!) 199/97  Pulse: 74    General Examination: The patient is a very pleasant 83 y.o. female in no acute distress. She appears frail and deconditioned. She is difficult to understand, she is minimally verbal.  HEENT: Normocephalic, atraumatic, pupils are equal, round and reactive to light and accommodation. She has difficulty tracking particularly in the vertical gaze. She has limitation to upper gaze. She has hearing impairment. She has moderate facial masking, she has a lower lip tremor. Neck is moderately rigid. She has edentulous speech and moderately to severely hypophonic today, mild dysarthria noted. Moderate mouth dryness noted. Mallampati is class III. Tongue is central.   Chest: Clear to auscultation without wheezing, rhonchi or crackles noted.  Heart: S1+S2+0,  regular and normal without murmurs, rubs or gallops noted.   Abdomen: Soft, non-tender and non-distended with normal bowel sounds appreciated on auscultation.  Extremities: There is 1+ pitting edema in the distal lower extremities bilaterally. Chronic changes after skin grafting.  Skin: Warm and dry without trophic changes noted.   Musculoskeletal: exam reveals no obvious joint deformities, tenderness or joint swelling or erythema.   Neurologically: Mental status: The patient is awake, not very alert, does not pay very close attention. She answers with one or 2 word sentences, is minimally verbal overall, his heart to understand today. Memory is impaired.   On 12/07/2013:  MMSE 17/30, CDT: 3/4, AFT: 5/min  On 06/28/2014: MMSE: 15/30, CDT: 2/4, AFT: 11/min.   On 09/25/2015: MMSE: 17/30, CDT: 1/4, AFT: 7/min.  On 02/19/2016: MMSE: 16/30, CDT: 2/4, AFT: 6/min.   Cranial nerves are as described above under HEENT exam. In addition,  she has a mildly weak shoulder shrug bilaterally.   Motor exam: Globally thin bulk, global strength of 4 out of 5, she has no dyskinesias, she has an intermittent resting tremor in the left upper extremity, she has a more consistent resting tremor in the right upper extremity, no lower extremity tremors. Fine motor skills are moderately impaired globally. She has moderate bradykinesia, Romberg is not testable. Reflexes are 1+ in the upper extremities and absent in the lower extremities.   Sensory exam is intact to light touch throughout.    Gait, station and balance: She  stands with significant difficulty and needs assistance, she has a moderately stooped posture, stands wide-based, she uses her rolling walker. She is unstable and needs guidance. She has no overt freezing spells. Balance is impaired.   Assessment and Plan:    In summary, Zakkiyya Barno is a very pleasant 83 y.o.-year old female with an underlying complex medical history of spinal stenosis, leg swelling, history of cellulitis of the jaw and also necrotizing fasciitis of the leg with status post skin grafting of the right leg, hypertension, depression, anxiety, osteoarthritis, and hypothyroidism as well as overweight state, who presents for follow-up consultation of her advanced right-sided predominant Parkinson's disease, complicated by chronic constipation, dementia, history of RBD, deconditioning, global weakness, anxiety, depression, Lethargy, spells of decreased attentiveness and overall physical challenges pertaining to aging. She has helped with caregivers and her daughter has been very attentive. She has been on Comtan and Sinemet. Comtan is 4 times a day, Sinemet is  2 pills alternating with 1-1/2 pills for a total of 4 doses per day. She has been on Lexapro. She has stopped the gabapentin. She had a recent spell of unresponsiveness during which she was not noticed to have any blood pressure issues, she was not noted to have any convulsion or twitching or moaning, she came out of it after about 4 hours. We have previously done an EEG in 2015 which was normal for age during the awake state. I suggested repeating an EEG, I don't think we need to put her through an MRI at this time. Patient has been on several medications in the past including Ambien, amitriptyline, amantadine, clonazepam, Seroquel at different times with intolerances to medications. She had been on Stalevo in the past which had to be changed to generic Sinemet and generic Comtan for financial reasons. Previous workup had included EEG and brain MRI as well as MRA, mostly in 2015. I suggested we try to do an EEG and called her daughter with the test results. I suggested a 6 month  routine follow-up. I suggested we continue with her current medication regimen. Unfortunately, I do not have a good answer as to what the spells are. Patient usually sees her PCP on a 3 monthly basis. I answered all their questions today and the patient and her daughter were in agreement. I spent 25 minutes in total face-to-face time with the patient, more than 50% of which was spent in counseling and coordination of care, reviewing test results, reviewing medication and discussing or reviewing the diagnosis of PD, its prognosis and treatment options. Pertinent laboratory and imaging test results that were available during this visit with the patient were reviewed by me and considered in my medical decision making (see chart for details).

## 2016-05-09 ENCOUNTER — Other Ambulatory Visit: Payer: Self-pay | Admitting: Internal Medicine

## 2016-05-15 ENCOUNTER — Other Ambulatory Visit: Payer: Medicare Other

## 2016-06-19 ENCOUNTER — Ambulatory Visit: Payer: Medicare Other | Admitting: Internal Medicine

## 2016-06-19 ENCOUNTER — Encounter: Payer: Self-pay | Admitting: Internal Medicine

## 2016-06-19 ENCOUNTER — Ambulatory Visit (INDEPENDENT_AMBULATORY_CARE_PROVIDER_SITE_OTHER): Payer: Medicare Other | Admitting: Internal Medicine

## 2016-06-19 VITALS — BP 140/74 | HR 65 | Temp 98.0°F | Ht 65.0 in | Wt 210.4 lb

## 2016-06-19 DIAGNOSIS — G20 Parkinson's disease: Secondary | ICD-10-CM

## 2016-06-19 DIAGNOSIS — K5901 Slow transit constipation: Secondary | ICD-10-CM

## 2016-06-19 DIAGNOSIS — I5032 Chronic diastolic (congestive) heart failure: Secondary | ICD-10-CM | POA: Diagnosis not present

## 2016-06-19 DIAGNOSIS — F0280 Dementia in other diseases classified elsewhere without behavioral disturbance: Secondary | ICD-10-CM

## 2016-06-19 DIAGNOSIS — IMO0001 Reserved for inherently not codable concepts without codable children: Secondary | ICD-10-CM

## 2016-06-19 DIAGNOSIS — R739 Hyperglycemia, unspecified: Secondary | ICD-10-CM

## 2016-06-19 DIAGNOSIS — G20A1 Parkinson's disease without dyskinesia, without mention of fluctuations: Secondary | ICD-10-CM

## 2016-06-19 DIAGNOSIS — E669 Obesity, unspecified: Secondary | ICD-10-CM | POA: Diagnosis not present

## 2016-06-19 LAB — COMPLETE METABOLIC PANEL WITH GFR
ALT: 5 U/L — ABNORMAL LOW (ref 6–29)
AST: 11 U/L (ref 10–35)
Albumin: 3.7 g/dL (ref 3.6–5.1)
Alkaline Phosphatase: 64 U/L (ref 33–130)
BUN: 21 mg/dL (ref 7–25)
CO2: 25 mmol/L (ref 20–31)
Calcium: 9.7 mg/dL (ref 8.6–10.4)
Chloride: 111 mmol/L — ABNORMAL HIGH (ref 98–110)
Creat: 1.38 mg/dL — ABNORMAL HIGH (ref 0.60–0.88)
GFR, Est African American: 40 mL/min — ABNORMAL LOW (ref >=60)
GFR, Est Non African American: 35 mL/min — ABNORMAL LOW (ref >=60)
Glucose, Bld: 88 mg/dL (ref 65–99)
Potassium: 4.6 mmol/L (ref 3.5–5.3)
Sodium: 144 mmol/L (ref 135–146)
Total Bilirubin: 0.8 mg/dL (ref 0.2–1.2)
Total Protein: 5.7 g/dL — ABNORMAL LOW (ref 6.1–8.1)

## 2016-06-19 LAB — CBC WITH DIFFERENTIAL/PLATELET
Basophils Absolute: 59 cells/uL (ref 0–200)
Basophils Relative: 1 %
Eosinophils Absolute: 177 cells/uL (ref 15–500)
Eosinophils Relative: 3 %
HCT: 50 % — ABNORMAL HIGH (ref 35.0–45.0)
Hemoglobin: 16.4 g/dL — ABNORMAL HIGH (ref 11.7–15.5)
Lymphocytes Relative: 19 %
Lymphs Abs: 1121 cells/uL (ref 850–3900)
MCH: 30.7 pg (ref 27.0–33.0)
MCHC: 32.8 g/dL (ref 32.0–36.0)
MCV: 93.6 fL (ref 80.0–100.0)
MPV: 10.8 fL (ref 7.5–12.5)
Monocytes Absolute: 649 cells/uL (ref 200–950)
Monocytes Relative: 11 %
Neutro Abs: 3894 cells/uL (ref 1500–7800)
Neutrophils Relative %: 66 %
Platelets: 167 10*3/uL (ref 140–400)
RBC: 5.34 MIL/uL — ABNORMAL HIGH (ref 3.80–5.10)
RDW: 14.5 % (ref 11.0–15.0)
WBC: 5.9 10*3/uL (ref 3.8–10.8)

## 2016-06-19 NOTE — Progress Notes (Signed)
Location:  [facility] clinic Provider:  Jasir Rother L. [name], D.O., C.M.D.  Code Status: DNR Goals of Care:  Advanced Directives 06/19/2016  Does Patient Have a Medical Advance Directive? Yes  Type of Advance Directive [place]  Does patient want to make changes to medical advance directive? -  Copy of [place] in Chart? Yes  Would patient like information on creating a medical advance directive? -  Pre-existing out of facility DNR order (yellow form or pink MOST form) -   Chief Complaint  Patient presents with  . Medical Management of Chronic Issues    4 month follow up    HPI: Patient is a 83 y.o. female seen today for medical management of chronic diseases.    Parkinson's disease:  No changes lately.  Seems to have more masked facies.  No concerns that are new.   She was having spells of decreased attentiveness--very widespread--1 every 3-4 mos or so.  She is to have an EEG done with Dr. Guadelupe Sabin office (ordered last month).  No episodes since last neuro appt.  Lift chair working better now for mobility.    She is not fasting today for labs as requested last visit, but not checking lipids so ok to still do.    Having a bm daily with current regimen.  Obesity:  Remains with bmi of 35 actually going up from where it was last time at 60. She is not mobile at all and chronically deconditioned.  Limited by her Parkinson's disease.  Past Medical History:  Diagnosis Date  . Benign essential hypertension   . Dementia in Parkinson's disease ([place])   . Depression   . History of necrotizing fasciitis    left leg, s/p debridement and graft  . Hypothyroidism   . Osteoarthritis, generalized   . Parkinson disease ([place])   . Spinal stenosis     Past Surgical History:  Procedure Laterality Date  . ABDOMINAL HYSTERECTOMY  1977  . SKIN DEBRIDEMENT  2014   [place], [place]  . SKIN GRAFT  2014   [place]  2006   spinal stenosis     Allergies  Allergen Reactions  . Piperacillin-Tazobactam In Dex Rash    Unclear whether associated with clindamycin or zosyn, but probably more likely to be zosyn.   [name] Dye [Iodinated Diagnostic Agents]     Only when intravenous, not on external skin.  . Clindamycin Rash    Unclear whether patient has an actual allergy to clindamycin. On beta-lactam at the same time.    Allergies as of 06/19/2016      Reactions   Piperacillin-tazobactam In Dex Rash   Unclear whether associated with clindamycin or zosyn, but probably more likely to be zosyn.    Ivp Dye [iodinated Diagnostic Agents]    Only when intravenous, not on external skin.   Clindamycin Rash   Unclear whether patient has an actual allergy to clindamycin. On beta-lactam at the same time.      Medication List       Accurate as of 06/19/16  8:59 AM. Always use your most recent med list.          acetaminophen 500 MG tablet Commonly known as:  TYLENOL Take 500 mg by mouth every 6 (six) hours as needed for mild pain.   aspirin EC 81 MG tablet Take 81 mg by mouth daily.   carbidopa-levodopa 25-100 MG tablet Commonly known as:  SINEMET IR Take  1.5 pills at 8 am and 12 and 2 pills at 4 pm and 2 pills at 8 pm.   carbidopa-levodopa 25-100 MG disintegrating tablet Commonly known as:  PARCOPA Take 1 tablet by mouth 2 (two) times daily as needed.   carbidopa-levodopa 50-200 MG tablet Commonly known as:  SINEMET CR Take 1 tablet by mouth at bedtime.   entacapone 200 MG tablet Commonly known as:  COMTAN Take 1 tablet (200 mg total) by mouth 4 (four) times daily. In lieu of stalevo, take with Sinemet 4 times a day [facility] for mailorder   entacapone 200 MG tablet Commonly known as:  COMTAN Take 1 tablet (200 mg total) by mouth 4 (four) times daily.   escitalopram 10 MG tablet Commonly known as:  LEXAPRO Take 1 tablet (10 mg total) by mouth daily.   hydrALAZINE 25 MG tablet Commonly known as:   APRESOLINE Take one tablet by mouth three times daily   levothyroxine 25 MCG tablet Commonly known as:  SYNTHROID, LEVOTHROID Take one tablet by mouth once daily 30 minutes before breakfast for thyroid   metoprolol succinate 25 MG 24 hr tablet Commonly known as:  TOPROL-XL TAKE 2 TABLETS BY MOUTH TWICE DAILY   Vitamin D3 2000 units Tabs Take 2,000 Units by mouth daily.       Review of Systems:  Review of Systems  Constitutional: Positive for malaise/fatigue. Negative for chills and fever.       Weight gain  Respiratory: Negative for cough and shortness of breath.   Cardiovascular: Negative for chest pain and palpitations.  Gastrointestinal: Positive for constipation. Negative for abdominal pain, blood in stool and melena.       Bowel regimen is working  Genitourinary: Negative for dysuria.  Musculoskeletal: Positive for back pain. Negative for falls.  Skin: Negative for itching and rash.  Neurological: Positive for tremors and weakness. Negative for dizziness and loss of consciousness.  Endo/Heme/Allergies: Does not bruise/bleed easily.  Psychiatric/Behavioral: Positive for memory loss. Negative for depression. The patient is not nervous/anxious and does not have insomnia.     Health Maintenance  Topic Date Due  . TETANUS/TDAP  02/28/1948  . DEXA SCAN  02/29/1996  . INFLUENZA VACCINE  08/20/2016  . PNA vac Low Risk Adult  Completed    Physical Exam: Vitals:   06/19/16 0839  BP: 140/74  Pulse: 65  Temp: 98 F (36.7 C)  SpO2: 97%  Weight: 210 lb 6.4 oz (95.4 kg)  Height: 5\' 5"  (1.651 m)   Body mass index is 35.01 kg/m. Physical Exam  Constitutional: She is oriented to person, place, and time. She appears well-developed and well-nourished. No distress.  Cardiovascular: Intact distal pulses.   irreg irreg  Pulmonary/Chest: Effort normal and breath sounds normal. No respiratory distress.  Abdominal: Soft. Bowel sounds are normal.  Musculoskeletal: Normal range  of motion.  Neurological: She is alert and oriented to person, place, and time.  Skin: Skin is warm and dry. Capillary refill takes less than 2 seconds.  Psychiatric:  Masked facies    Labs reviewed: Basic Metabolic Panel:  Recent Labs  10/15/15 0922 02/18/16 1024  NA 142  --   K 4.2  --   CL 110  --   CO2 26  --   GLUCOSE 109*  --   BUN 16  --   CREATININE 1.21*  --   CALCIUM 9.8  --   TSH 5.06* 3.33   Liver Function Tests:  Recent Labs  10/15/15 4259  AST 11  ALT 4*  ALKPHOS 62  BILITOT 1.0  PROT 6.2  ALBUMIN 3.9   No results for input(s): LIPASE, AMYLASE in the last 8760 hours. No results for input(s): AMMONIA in the last 8760 hours. CBC:  Recent Labs  10/15/15 0922  WBC 6.1  NEUTROABS 3,843  HGB 16.9*  HCT 50.0*  MCV 93.5  PLT 161   Lipid Panel:  Recent Labs  10/15/15 0922  CHOL 214*  HDL 62  LDLCALC 136*  TRIG 79  CHOLHDL 3.5   Lab Results  Component Value Date   HGBA1C 5.5 10/15/2015    Assessment/Plan 1. Parkinson disease ([place]) -ongoing, cont entacapon, sinemet IR and CR and parcopa -follows with [name] -periods of lethargy and decreased responsiveness--none since seen last by neuro a month ago, but also has not had the EEG yet for some reason - COMPLETE METABOLIC PANEL WITH GFR - CBC with Differential/Platelet  2. Dementia due to Parkinson's disease without behavioral disturbance ([place]) -not on meds for this  - COMPLETE METABOLIC PANEL WITH GFR - CBC with Differential/Platelet  3. Chronic diastolic CHF (congestive heart failure) (HCC) -stable, keeps gaining weight but seems to be due to immobility not fluid - COMPLETE METABOLIC PANEL WITH GFR - CBC with Differential/Platelet  4. Obesity, Class II, BMI 35.0-39.9, with comorbidity (see actual BMI) -is inactive, has been ongoing, weight trending up - COMPLETE METABOLIC PANEL WITH GFR - CBC with Differential/Platelet  5. Hyperglycemia - f/u labs - COMPLETE METABOLIC  PANEL WITH GFR - Hemoglobin A1c  6. Slow transit constipation -stable with current bowel regimen which oddly is not on med list so I have no idea what she's using  Labs/tests ordered:   Orders Placed This Encounter  Procedures  . COMPLETE METABOLIC PANEL WITH GFR  . CBC with Differential/Platelet  . Hemoglobin A1c    Next appt:  4 mos med mgt, come fasting  Meilah Delrosario L. Jozi Malachi, D.O. [facility] [address], [place] [address] Cell Phone (Mon-Fri 8am-5pm):  [phone] On Call:  [phone] & follow prompts after 5pm & weekends Office Phone:  [phone] Office Fax:  [phone]

## 2016-06-20 LAB — HEMOGLOBIN A1C
Hgb A1c MFr Bld: 5.6 % (ref <5.7)
Mean Plasma Glucose: 114 mg/dL

## 2016-07-19 ENCOUNTER — Other Ambulatory Visit: Payer: Self-pay | Admitting: Internal Medicine

## 2016-09-14 ENCOUNTER — Other Ambulatory Visit: Payer: Self-pay | Admitting: Internal Medicine

## 2016-09-15 ENCOUNTER — Other Ambulatory Visit: Payer: Self-pay | Admitting: *Deleted

## 2016-09-15 MED ORDER — HYDRALAZINE HCL 25 MG PO TABS: 25.0000 mg | ORAL_TABLET | Freq: Three times a day (TID) | ORAL | 1 refills | Status: DC

## 2016-09-15 NOTE — Telephone Encounter (Signed)
[facility] 

## 2016-09-18 ENCOUNTER — Other Ambulatory Visit: Payer: Self-pay

## 2016-09-18 DIAGNOSIS — F0280 Dementia in other diseases classified elsewhere without behavioral disturbance: Secondary | ICD-10-CM

## 2016-09-18 DIAGNOSIS — G3183 Dementia with Lewy bodies: Secondary | ICD-10-CM

## 2016-09-18 DIAGNOSIS — F419 Anxiety disorder, unspecified: Secondary | ICD-10-CM

## 2016-09-18 DIAGNOSIS — R29898 Other symptoms and signs involving the musculoskeletal system: Secondary | ICD-10-CM

## 2016-09-18 DIAGNOSIS — G20 Parkinson's disease: Secondary | ICD-10-CM

## 2016-09-18 MED ORDER — ENTACAPONE 200 MG PO TABS
200.0000 mg | ORAL_TABLET | Freq: Four times a day (QID) | ORAL | 3 refills | Status: DC
Start: 2016-09-18 — End: 2017-02-19

## 2016-09-18 NOTE — Telephone Encounter (Signed)
Received notice from [facility] that pt's comtan RX has expired and a new RX is needed. Is only valid for 6 months, not one year. Will send refill to [facility].

## 2016-09-30 ENCOUNTER — Ambulatory Visit (INDEPENDENT_AMBULATORY_CARE_PROVIDER_SITE_OTHER): Payer: Medicare Other | Admitting: Nurse Practitioner

## 2016-09-30 ENCOUNTER — Encounter: Payer: Self-pay | Admitting: Nurse Practitioner

## 2016-09-30 VITALS — BP 142/80 | HR 65 | Temp 97.6°F | Resp 17 | Ht 65.0 in | Wt 208.6 lb

## 2016-09-30 DIAGNOSIS — L89322 Pressure ulcer of left buttock, stage 2: Secondary | ICD-10-CM | POA: Diagnosis not present

## 2016-09-30 NOTE — Patient Instructions (Addendum)
Make sure eating nutritional diet- high in protein to promote healing- add low sugar supplement- muscle milk, Glucerna daily  Change position every 2 hours! Use a pressure reduction cushion- no donut cushions.  Use duoderm- change every 3-4 times days until healed or can use for prevention   Pressure Injury A pressure injury, sometimes called a bedsore, is an injury to the skin and underlying tissue caused by pressure. Pressure on blood vessels causes decreased blood flow to the skin, which can eventually cause the skin tissue to die and break down into a wound. Pressure injuries usually occur:  Over bony parts of the body such as the tailbone, shoulders, elbows, hips, and heels.  Under medical devices such as respiratory equipment, stockings, tubes, and splints.  Pressure injuries start as reddened areas on the skin and can lead to pain, muscle damage, and infection. Pressure injuries can vary in severity. What are the causes? Pressure injuries are caused by a lack of blood supply to an area of skin. They can occur from intense pressure over a short period of time or from less intense pressure over a long period of time. What increases the risk? This condition is more likely to develop in people who:  Are in the hospital or an extended care facility.  Are bedridden or in a wheelchair.  Have an injury or disease that keeps them from: ? Moving normally. ? Feeling pain or pressure.  Have a condition that: ? Makes them sleepy or less alert. ? Causes poor blood flow.  Need to wear a medical device.  Have poor control of their bladder or bowel functions (incontinence).  Have poor nutrition (malnutrition).  Are of certain ethnicities. People of African American and Latino or Hispanic descent are at higher risk compared to other ethnic groups.  If you are at risk for pressure ulcers, your health care provider may recommend certain types of bedding to help prevent them. These may  include foam or gel mattresses covered with one of the following:  A sheepskin blanket.  A pad that is filled with gel, air, water, or foam.  What are the signs or symptoms? The main symptom is a blister or change in skin color that opens into a wound. Other symptoms include:  Red or dark areas of skin that do not turn white or pale when pressed with a finger.  Pain, warmth, or change of skin texture.  How is this diagnosed? This condition is diagnosed with a medical history and physical exam. You may also have tests, including:  Blood tests to check for infection or signs of poor nutrition.  Imaging studies to check for damage to the deep tissues under your skin.  Blood flow studies.  Your pressure injury will be staged to determine its severity. Staging is an assessment of:  The depth of the pressure injury.  Which tissues are exposed because of the pressure injury.  The causes of the pressure injury.  How is this treated? The main focus of treatment is to help your injury heal. This may be done by:  Relieving or redistributing pressure on your skin. This includes: ? Frequently changing your position. ? Eliminating or minimizing positions that caused the wound or that can make the wound worse. ? Using specific bed mattresses and chair cushions. ? Refitting, resizing, or replacing any medical devices, or padding the skin under them. ? Using creams or powders to prevent rubbing (friction) on the skin.  Keeping your skin clean and dry. This  may include using a skin cleanser or skin protectant as told by your health care provider. This may be a lotion, ointment, or spray.  Cleaning your injury and removing any dead tissue from the wound (debridement).  Placing a bandage (dressing) over your injury.  Preventing or treating infection. This may include antibiotic, antimicrobial, or antiseptic medicines.  Treatment may also include medicine for pain. Sometimes surgery is  needed to close the wound with a flap of healthy skin or a piece of skin from another area of your body (graft). You may need surgery if other treatments are not working or if your injury is very deep. Follow these instructions at home: Wound care  Follow instructions from your health care provider about: ? How to take care of your wound. ? When and how you should change your dressing. ? When you should remove your dressing. If your dressing is dry and stuck when you try to remove it, moisten or wet the dressing with saline or water so that it can be removed without harming your skin or wound tissue.  Check your wound every day for signs of infection. Have a caregiver do this for you if you are not able. Watch for: ? More redness, swelling, or pain. ? More fluid, blood, or pus. ? A bad smell. Skin Care  Keep your skin clean and dry. Gently pat your skin dry.  Do not rub or massage your skin.  Use a skin protectant only as told by your health care provider.  Check your skin every day for any changes in color or any new blisters or sores (ulcers). Have a caregiver do this for you if you are not able. Medicines  Take over-the-counter and prescription medicines only as told by your health care provider.  If you were prescribed an antibiotic medicine, take it or apply it as told by your health care provider. Do not stop taking or using the antibiotic even if your condition improves. Reducing and Redistributing Pressure  Do not lie or sit in one position for a long time. Move or change position every two hours or as told by your health care provider.  Use pillows or cushions to reduce pressure. Ask your health care provider to recommend cushions or pads for you.  Use medical devices that do not rub your skin. Tell your health care provider if one of your medical devices is causing a pressure injury to develop. General instructions   Eat a healthy diet that includes lots of protein. Ask  your health care provider for diet advice.  Drink enough fluid to keep your urine clear or pale yellow.  Be as active as you can every day. Ask your health care provider to suggest safe exercises or activities.  Do not abuse drugs or alcohol.  Keep all follow-up visits as told by your health care provider. This is important.  Do not smoke. Contact a health care provider if:   You have chills or fever.  Your pain medicine is not helping.  You have any changes in skin color.  You have new blisters or sores.  You develop warmth, redness, or swelling near a pressure injury.  You have a bad odor or pus coming from your pressure injury.  You lose control of your bowels or bladder.  You develop new symptoms.  Your wound does not improve after 1-2 weeks of treatment.  You develop a new medical condition, such as diabetes, peripheral vascular disease, or conditions that affect your  defense (immune) system. This information is not intended to replace advice given to you by your health care provider. Make sure you discuss any questions you have with your health care provider. Document Released: 01/06/2005 Document Revised: 06/11/2015 Document Reviewed: 05/17/2014 Elsevier Interactive Patient Education  [employer].

## 2016-09-30 NOTE — Progress Notes (Signed)
Careteam: Patient Care Team: Gayland Curry, DO as PCP - General (Geriatric Medicine) Star Age, MD as Attending Physician (Neurology)  Advanced Directive information Does Patient Have a Medical Advance Directive?: Yes, Type of Advance Directive: Healthcare Power of Attorney  Allergies  Allergen Reactions  . Piperacillin-Tazobactam In Dex Rash    Unclear whether associated with clindamycin or zosyn, but probably more likely to be zosyn.   [name] Dye [Iodinated Diagnostic Agents]     Only when intravenous, not on external skin.  . Clindamycin Rash    Unclear whether patient has an actual allergy to clindamycin. On beta-lactam at the same time.    Chief Complaint  Patient presents with  . Acute Visit    Pt is being seen for a pressure sore on her buttock x 10 days.   . Other    Daughter in room      HPI: Patient is a 83 y.o. female seen in the office today due to pressure ulcer.  Daughter here with pt. Pt with hx of parkinson's disease, dementia, htn.  Reports she has had pressure ulcer before but it would heal however this is not healing. Daughter noticed this ~10 days ago.  Sits a lot in a chair. Has doughnut cushion. Walks to the bathroom and will go to kitchen occasionally  Eats well. A lot of vegetables, proteins with chicken or sausage, does not eat eggs.   Review of Systems:  Review of Systems  Unable to perform ROS: Dementia    Past Medical History:  Diagnosis Date  . Benign essential hypertension   . Dementia in Parkinson's disease ([place])   . Depression   . History of necrotizing fasciitis    left leg, s/p debridement and graft  . Hypothyroidism   . Osteoarthritis, generalized   . Parkinson disease ([place])   . Spinal stenosis    Past Surgical History:  Procedure Laterality Date  . ABDOMINAL HYSTERECTOMY  1977  . SKIN DEBRIDEMENT  2014   [place]helt, [place]  . SKIN GRAFT  2014   [place]  2006   spinal stenosis   Social History:   reports that she has never smoked. She has never used smokeless tobacco. She reports that she does not drink alcohol or use drugs.  Family History  Problem Relation Age of Onset  . Heart disease Mother   . Heart disease Sister   . Hypertension Sister   . Stroke Sister   . Heart disease Sister        heart attack  . Cancer Sister        colon    Medications: Patient's Medications  New Prescriptions   No medications on file  Previous Medications   ACETAMINOPHEN (TYLENOL) 500 MG TABLET    Take 500 mg by mouth every 6 (six) hours as needed for mild pain.    ASPIRIN EC 81 MG TABLET    Take 81 mg by mouth daily.   CARBIDOPA-LEVODOPA (PARCOPA) 25-100 MG DISINTEGRATING TABLET    Take 1 tablet by mouth 2 (two) times daily as needed.   CARBIDOPA-LEVODOPA (SINEMET CR) 50-200 MG TABLET    Take 1 tablet by mouth at bedtime.   CARBIDOPA-LEVODOPA (SINEMET IR) 25-100 MG TABLET    Take 1.5 pills at 8 am and 12 and 2 pills at 4 pm and 2 pills at 8 pm.   CHOLECALCIFEROL (VITAMIN D3) 2000 UNITS TABS    Take 2,000 Units by mouth daily.  ENTACAPONE (COMTAN) 200 MG TABLET    Take 1 tablet (200 mg total) by mouth 4 (four) times daily.   ESCITALOPRAM (LEXAPRO) 10 MG TABLET    Take 1 tablet (10 mg total) by mouth daily.   HYDRALAZINE (APRESOLINE) 25 MG TABLET    Take 1 tablet (25 mg total) by mouth 3 (three) times daily.   LEVOTHYROXINE (SYNTHROID, LEVOTHROID) 25 MCG TABLET    Take one tablet by mouth once daily 30 minutes before breakfast for thyroid   METOPROLOL SUCCINATE (TOPROL-XL) 25 MG 24 HR TABLET    TAKE 2 TABLETS BY MOUTH TWICE DAILY  Modified Medications   No medications on file  Discontinued Medications   CARBIDOPA-LEVODOPA (SINEMET CR) 50-200 MG TABLET    Take 1 tablet by mouth at bedtime.   ENTACAPONE (COMTAN) 200 MG TABLET    Take 1 tablet (200 mg total) by mouth 4 (four) times daily. In lieu of stalevo, take with Sinemet 4 times a day [facility] for mailorder     Physical  Exam:  Vitals:   09/30/16 1415  BP: (!) 142/80  Pulse: 65  Resp: 17  Temp: 97.6 F (36.4 C)  TempSrc: Oral  SpO2: 96%  Weight: 208 lb 9.6 oz (94.6 kg)  Height: 5\' 5"  (1.651 m)   Body mass index is 34.71 kg/m.  Physical Exam  Constitutional: She is oriented to person, place, and time. She appears well-developed and well-nourished. No distress.  Cardiovascular: Normal rate and normal heart sounds.   irreg irreg  Pulmonary/Chest: Effort normal and breath sounds normal. No respiratory distress.  Musculoskeletal: Normal range of motion.  Neurological: She is alert and oriented to person, place, and time.  Skin: Skin is warm and dry. Capillary refill takes less than 2 seconds.  Left buttock-1 cm x 1 cm circular stage 2 pressure injury- red base, no drainage or odor.   Psychiatric:  Masked facies    Labs reviewed: Basic Metabolic Panel:  Recent Labs  10/15/15 0922 02/18/16 1024 06/19/16 0916  NA 142  --  144  K 4.2  --  4.6  CL 110  --  111*  CO2 26  --  25  GLUCOSE 109*  --  88  BUN 16  --  21  CREATININE 1.21*  --  1.38*  CALCIUM 9.8  --  9.7  TSH 5.06* 3.33  --    Liver Function Tests:  Recent Labs  10/15/15 0922 06/19/16 0916  AST 11 11  ALT 4* 5*  ALKPHOS 62 64  BILITOT 1.0 0.8  PROT 6.2 5.7*  ALBUMIN 3.9 3.7   No results for input(s): LIPASE, AMYLASE in the last 8760 hours. No results for input(s): AMMONIA in the last 8760 hours. CBC:  Recent Labs  10/15/15 0922 06/19/16 0916  WBC 6.1 5.9  NEUTROABS 3,843 3,894  HGB 16.9* 16.4*  HCT 50.0* 50.0*  MCV 93.5 93.6  PLT 161 167   Lipid Panel:  Recent Labs  10/15/15 0922  CHOL 214*  HDL 62  LDLCALC 136*  TRIG 79  CHOLHDL 3.5   TSH:  Recent Labs  10/15/15 0922 02/18/16 1024  TSH 5.06* 3.33   A1C: Lab Results  Component Value Date   HGBA1C 5.6 06/19/2016     Assessment/Plan 1. Decubitus ulcer of left buttock, stage 2 encouraged nutritional diet- high in protein to promote  healing- add low sugar supplement daily after or in between meals.  Change position every 2 hours Use a pressure reduction cushion-  no donut cushions.  Use duoderm- change every 3-4 times days until healed or can use for prevention   Next appt: Dr [name] in 1 month or sooner if needed due to delay in healing or worsening of pressure ulcer.  Carlos American. Harle Battiest  [facility] [phone] 8 am - 5 pm) [phone] (after hours)

## 2016-10-23 ENCOUNTER — Encounter: Payer: Self-pay | Admitting: Internal Medicine

## 2016-10-23 ENCOUNTER — Ambulatory Visit (INDEPENDENT_AMBULATORY_CARE_PROVIDER_SITE_OTHER): Payer: Medicare Other | Admitting: Internal Medicine

## 2016-10-23 ENCOUNTER — Telehealth: Payer: Self-pay | Admitting: *Deleted

## 2016-10-23 VITALS — BP 138/80 | HR 63 | Temp 97.9°F | Wt 210.0 lb

## 2016-10-23 DIAGNOSIS — L89322 Pressure ulcer of left buttock, stage 2: Secondary | ICD-10-CM

## 2016-10-23 DIAGNOSIS — I5032 Chronic diastolic (congestive) heart failure: Secondary | ICD-10-CM

## 2016-10-23 DIAGNOSIS — Z23 Encounter for immunization: Secondary | ICD-10-CM

## 2016-10-23 DIAGNOSIS — G20 Parkinson's disease: Secondary | ICD-10-CM

## 2016-10-23 DIAGNOSIS — I10 Essential (primary) hypertension: Secondary | ICD-10-CM

## 2016-10-23 DIAGNOSIS — R739 Hyperglycemia, unspecified: Secondary | ICD-10-CM

## 2016-10-23 DIAGNOSIS — G20A1 Parkinson's disease without dyskinesia, without mention of fluctuations: Secondary | ICD-10-CM

## 2016-10-23 NOTE — Progress Notes (Signed)
Location:  [facility] clinic Provider:  Verna Desrocher L. [name], D.O., C.M.D.  Code Status: DNR Goals of Care:  Advanced Directives 09/30/2016  Does Patient Have a Medical Advance Directive? Yes  Type of Advance Directive [place]  Does patient want to make changes to medical advance directive? -  Copy of [place] in Chart? -  Would patient like information on creating a medical advance directive? -  Pre-existing out of facility DNR order (yellow form or pink MOST form) -   Chief Complaint  Patient presents with  . Medical Management of Chronic Issues    85mth follow-up    HPI: Patient is a 83 y.o. female seen today for medical management of chronic diseases.    Had a wound on her buttocks that healed, but now has a new one.  Has a therapeutic pillow to sit on.  This one started last week.  Sometimes painful.  Only in bed at night.  Up all day.  Mostly in a recliner chair all day with the cushion.  Gets up every 2 hrs to go to the bathroom and does walk around with her rollator. Still eating well.  No signs of infection or drainage.  No warmth.  Right now, rinsed with saline and put patches over it.  Already getting better from last week.  They've been trying to give her glucerna but she doesn't like them.  Parkinson's stable.  Dementia not markedly changed either.    Past Medical History:  Diagnosis Date  . Benign essential hypertension   . Dementia in Parkinson's disease ([place])   . Depression   . History of necrotizing fasciitis    left leg, s/p debridement and graft  . Hypothyroidism   . Osteoarthritis, generalized   . Parkinson disease ([place])   . Spinal stenosis     Past Surgical History:  Procedure Laterality Date  . ABDOMINAL HYSTERECTOMY  1977  . SKIN DEBRIDEMENT  2014   [place]  . SKIN GRAFT  2014   [place]  2006   spinal stenosis    Allergies  Allergen Reactions  . Piperacillin-Tazobactam In Dex Rash   Unclear whether associated with clindamycin or zosyn, but probably more likely to be zosyn.   [name] Dye [Iodinated Diagnostic Agents]     Only when intravenous, not on external skin.  . Clindamycin Rash    Unclear whether patient has an actual allergy to clindamycin. On beta-lactam at the same time.    Outpatient Encounter Prescriptions as of 10/23/2016  Medication Sig  . acetaminophen (TYLENOL) 500 MG tablet Take 500 mg by mouth every 6 (six) hours as needed for mild pain.   Marland Kitchen aspirin EC 81 MG tablet Take 81 mg by mouth daily.  . carbidopa-levodopa (PARCOPA) 25-100 MG disintegrating tablet Take 1 tablet by mouth 2 (two) times daily as needed.  . carbidopa-levodopa (SINEMET CR) 50-200 MG tablet Take 1 tablet by mouth at bedtime.  . carbidopa-levodopa (SINEMET IR) 25-100 MG tablet Take 1.5 pills at 8 am and 12 and 2 pills at 4 pm and 2 pills at 8 pm.  . Cholecalciferol (VITAMIN D3) 2000 UNITS TABS Take 2,000 Units by mouth daily.   . entacapone (COMTAN) 200 MG tablet Take 1 tablet (200 mg total) by mouth 4 (four) times daily.  Marland Kitchen escitalopram (LEXAPRO) 10 MG tablet Take 1 tablet (10 mg total) by mouth daily.  . hydrALAZINE (APRESOLINE) 25 MG tablet Take 1 tablet (  25 mg total) by mouth 3 (three) times daily.  Marland Kitchen levothyroxine (SYNTHROID, LEVOTHROID) 25 MCG tablet Take one tablet by mouth once daily 30 minutes before breakfast for thyroid  . metoprolol succinate (TOPROL-XL) 25 MG 24 hr tablet TAKE 2 TABLETS BY MOUTH TWICE DAILY   No facility-administered encounter medications on file as of 10/23/2016.     Review of Systems:  Review of Systems  Constitutional: Positive for malaise/fatigue. Negative for chills and fever.  HENT: Positive for hearing loss. Negative for congestion.   Eyes: Negative for blurred vision.  Respiratory: Negative for cough and shortness of breath.   Cardiovascular: Negative for chest pain, palpitations and leg swelling.       Had swelling last week but only lasted one  day  Gastrointestinal: Negative for abdominal pain, blood in stool, constipation and melena.       Pt c/o constipation but bowels do move daily per caregiver  Genitourinary: Negative for dysuria.  Musculoskeletal: Negative for falls.  Skin: Negative for itching and rash.  Neurological: Positive for tremors and weakness. Negative for dizziness and loss of consciousness.  Endo/Heme/Allergies: Does not bruise/bleed easily.  Psychiatric/Behavioral: Positive for memory loss.    Health Maintenance  Topic Date Due  . TETANUS/TDAP  02/28/1948  . DEXA SCAN  02/29/1996  . INFLUENZA VACCINE  08/20/2016  . PNA vac Low Risk Adult  Completed    Physical Exam: Vitals:   10/23/16 0859  BP: 138/80  Pulse: 63  Temp: 97.9 F (36.6 C)  TempSrc: Oral  SpO2: 96%  Weight: 210 lb (95.3 kg)   Body mass index is 34.95 kg/m. Physical Exam  Constitutional: She appears well-developed and well-nourished. No distress.  Cardiovascular: Normal rate, regular rhythm, normal heart sounds and intact distal pulses.   Pulmonary/Chest: Effort normal and breath sounds normal. No respiratory distress.  Abdominal: Bowel sounds are normal.  Musculoskeletal: Normal range of motion.  Neurological: She is alert.  Masked facies, resting tremor, cogwheel rigidity, comes in walker, takes very small steps when moving from wheelchair to exam table for exam  Skin: Skin is warm and dry.  Couple of mm opening of left medial buttock which is red, no surrounding erythema, warmth, drainage, is tender    Labs reviewed: Basic Metabolic Panel:  Recent Labs  02/18/16 1024 06/19/16 0916  NA  --  144  K  --  4.6  CL  --  111*  CO2  --  25  GLUCOSE  --  88  BUN  --  21  CREATININE  --  1.38*  CALCIUM  --  9.7  TSH 3.33  --    Liver Function Tests:  Recent Labs  06/19/16 0916  AST 11  ALT 5*  ALKPHOS 64  BILITOT 0.8  PROT 5.7*  ALBUMIN 3.7   No results for input(s): LIPASE, AMYLASE in the last 8760 hours. No  results for input(s): AMMONIA in the last 8760 hours. CBC:  Recent Labs  06/19/16 0916  WBC 5.9  NEUTROABS 3,894  HGB 16.4*  HCT 50.0*  MCV 93.6  PLT 167   Lipid Panel: No results for input(s): CHOL, HDL, LDLCALC, TRIG, CHOLHDL, LDLDIRECT in the last 8760 hours. Lab Results  Component Value Date   HGBA1C 5.6 06/19/2016   Assessment/Plan 1. Need for immunization against influenza - Flu vaccine HIGH DOSE PF (Fluzone High dose)  2. Parkinson disease ([place]) -stable lately, cont same regimen and monitor, cont caregiver help/24h supervision - CBC with Differential/Platelet - COMPLETE METABOLIC  PANEL WITH GFR - Hemoglobin A1c  3. Chronic diastolic CHF (congestive heart failure) ([facility]) -stable w/o exacerbation, cont same regimen and monitor - TSH  4. Benign essential hypertension -bp at goal, cont same regimen  5. Pressure injury of left buttock, stage 2 -use endit cream over the wound, then apply duoderm for protection--change every 3 days and prn soiled dressing or wrinkled up dressing  6.  Hyperglycemia -control has improved last few checks - Hemoglobin A1c - Lipid panel  Labs/tests ordered:   Orders Placed This Encounter  Procedures  . Flu vaccine HIGH DOSE PF (Fluzone High dose)  . CBC with Differential/Platelet  . COMPLETE METABOLIC PANEL WITH GFR  . Hemoglobin A1c  . TSH  . Lipid panel   Next appt:  02/23/2017 med mgt  Unice Vantassel L. Mark Hassey, D.O. [facility] [address], [place] [address] Cell Phone (Mon-Fri 8am-5pm):  [phone] On Call:  [phone] & follow prompts after 5pm & weekends Office Phone:  [phone] Office Fax:  [phone]

## 2016-10-23 NOTE — Telephone Encounter (Signed)
Patient daughter, Andrea Dorsey called and stated that they went to the pharmacy and the medication for the Pressure sore was not there that Dr. [name] told her to try. Daughter wants to make sure it was called in. Please Advise. (Reviewed OV note, not complete yet)

## 2016-10-23 NOTE — Telephone Encounter (Signed)
It's over the counter and can be ordered from the [facility] website--I gave them a handout with the website on it.

## 2016-10-23 NOTE — Telephone Encounter (Signed)
Wanda notified and agreed.  

## 2016-10-24 LAB — CBC WITH DIFFERENTIAL/PLATELET
Basophils Absolute: 51 cells/uL (ref 0–200)
Basophils Relative: 0.8 %
Eosinophils Absolute: 192 cells/uL (ref 15–500)
Eosinophils Relative: 3 %
HCT: 50.3 % — ABNORMAL HIGH (ref 35.0–45.0)
Hemoglobin: 16.8 g/dL — ABNORMAL HIGH (ref 11.7–15.5)
Lymphs Abs: 1216 cells/uL (ref 850–3900)
MCH: 30.3 pg (ref 27.0–33.0)
MCHC: 33.4 g/dL (ref 32.0–36.0)
MCV: 90.8 fL (ref 80.0–100.0)
MPV: 11 fL (ref 7.5–12.5)
Monocytes Relative: 9.6 %
Neutro Abs: 4326 cells/uL (ref 1500–7800)
Neutrophils Relative %: 67.6 %
Platelets: 177 10*3/uL (ref 140–400)
RBC: 5.54 10*6/uL — ABNORMAL HIGH (ref 3.80–5.10)
RDW: 12.9 % (ref 11.0–15.0)
Total Lymphocyte: 19 %
WBC mixed population: 614 cells/uL (ref 200–950)
WBC: 6.4 10*3/uL (ref 3.8–10.8)

## 2016-10-24 LAB — LIPID PANEL
Cholesterol: 218 mg/dL — ABNORMAL HIGH (ref <200)
HDL: 66 mg/dL (ref >50)
LDL Cholesterol (Calc): 135 mg/dL (calc) — ABNORMAL HIGH
Non-HDL Cholesterol (Calc): 152 mg/dL (calc) — ABNORMAL HIGH (ref <130)
Total CHOL/HDL Ratio: 3.3 (calc) (ref <5.0)
Triglycerides: 73 mg/dL (ref <150)

## 2016-10-24 LAB — COMPLETE METABOLIC PANEL WITH GFR
AG Ratio: 1.7 (calc) (ref 1.0–2.5)
ALT: 4 U/L — ABNORMAL LOW (ref 6–29)
AST: 12 U/L (ref 10–35)
Albumin: 4 g/dL (ref 3.6–5.1)
Alkaline phosphatase (APISO): 66 U/L (ref 33–130)
BUN/Creatinine Ratio: 18 (calc) (ref 6–22)
BUN: 21 mg/dL (ref 7–25)
CO2: 28 mmol/L (ref 20–32)
Calcium: 9.9 mg/dL (ref 8.6–10.4)
Chloride: 107 mmol/L (ref 98–110)
Creat: 1.2 mg/dL — ABNORMAL HIGH (ref 0.60–0.88)
GFR, Est African American: 48 mL/min/{1.73_m2} — ABNORMAL LOW (ref >=60)
GFR, Est Non African American: 41 mL/min/{1.73_m2} — ABNORMAL LOW (ref >=60)
Globulin: 2.4 g/dL (calc) (ref 1.9–3.7)
Glucose, Bld: 110 mg/dL — ABNORMAL HIGH (ref 65–99)
Potassium: 4.6 mmol/L (ref 3.5–5.3)
Sodium: 141 mmol/L (ref 135–146)
Total Bilirubin: 1 mg/dL (ref 0.2–1.2)
Total Protein: 6.4 g/dL (ref 6.1–8.1)

## 2016-10-24 LAB — TSH: TSH: 4.21 mIU/L (ref 0.40–4.50)

## 2016-10-24 LAB — HEMOGLOBIN A1C
Hgb A1c MFr Bld: 5.4 % of total Hgb (ref <5.7)
Mean Plasma Glucose: 108 (calc)
eAG (mmol/L): 6 (calc)

## 2016-11-05 ENCOUNTER — Ambulatory Visit: Payer: Medicare Other | Admitting: Neurology

## 2016-11-19 ENCOUNTER — Telehealth: Payer: Self-pay

## 2016-11-19 DIAGNOSIS — G20 Parkinson's disease: Secondary | ICD-10-CM

## 2016-11-19 MED ORDER — CARBIDOPA-LEVODOPA 25-100 MG PO TABS: ORAL_TABLET | ORAL | 0 refills | Status: DC

## 2016-11-19 NOTE — Telephone Encounter (Signed)
Refill faxed to [facility]

## 2016-12-23 ENCOUNTER — Other Ambulatory Visit: Payer: Self-pay | Admitting: *Deleted

## 2016-12-23 MED ORDER — LEVOTHYROXINE SODIUM 25 MCG PO TABS: ORAL_TABLET | ORAL | 3 refills | Status: DC

## 2016-12-23 NOTE — Telephone Encounter (Signed)
[facility] 

## 2016-12-29 ENCOUNTER — Telehealth: Payer: Self-pay

## 2016-12-29 NOTE — Telephone Encounter (Signed)
I called pt, spoke to Imogene Burn, per [facility], advised her that our office will be closed on 12/30/16 and pt's appt will need to be rescheduled. Pt's daughter is agreeable to an appt on 01/02/17 at 8:30am. Pt's daughter verbalized understanding of new appt date and time.

## 2016-12-30 ENCOUNTER — Ambulatory Visit: Payer: Medicare Other | Admitting: Neurology

## 2017-01-02 ENCOUNTER — Encounter: Payer: Self-pay | Admitting: Neurology

## 2017-01-02 ENCOUNTER — Ambulatory Visit: Payer: Medicare Other | Admitting: Neurology

## 2017-01-02 VITALS — BP 188/90 | HR 68 | Ht 64.0 in | Wt 210.0 lb

## 2017-01-02 DIAGNOSIS — G479 Sleep disorder, unspecified: Secondary | ICD-10-CM | POA: Diagnosis not present

## 2017-01-02 DIAGNOSIS — G4752 REM sleep behavior disorder: Secondary | ICD-10-CM

## 2017-01-02 DIAGNOSIS — G20 Parkinson's disease: Secondary | ICD-10-CM | POA: Diagnosis not present

## 2017-01-02 MED ORDER — CLONAZEPAM 0.25 MG PO TBDP: ORAL_TABLET | ORAL | 1 refills | Status: DC

## 2017-01-02 NOTE — Patient Instructions (Addendum)
We will continue with your medications for Parkinson's.   We will try you again on a low dose of Klonopin (generic: clonazepam): 0.25 mg, take 1/2 pill to one pill each bedtime. Common side effects include sedation or sleepiness, balance problem, personality changes.  Your daughter and your caretakers will look out for potential adverse issues, such as being too sleepy/lethargic during the day, confusion etc.

## 2017-01-02 NOTE — Progress Notes (Signed)
Subjective:    Patient ID: Andrea Dorsey is a 83 y.o. female.  HPI     Interim history:   [name] is a very pleasant 83 year old right-handed woman with an underlying complex medical history of spinal stenosis, necrotizing fasciitis, status post debridement and grafting on the right lower leg, lower extremity edema, insomnia, hypothyroidism, osteoarthritis, hypertension, depression, and obesity, who presents for followup consultation of her advanced, right-sided predominant Parkinson's disease, complicated by hallucinations, memory loss, mood disorder including anxiety and depression, sleep disorder including insomnia, OSA and RBD, lethargy, and complications pertaining to deconditioning and advancing age. She is accompanied by her daughter again today. I last saw her on 05/01/2016 for a sooner than scheduled appointment because of an episode of altered mental status. Her daughter called on 04/19/2016 reporting that patient was less responsive for about an hour. We had advised her daughter to take patient to the emergency room. I ordered an EEG. She did not have the EEG.  Today, 01/02/2017 (all dictated new, as well as above notes, some dictation done in note pad or Word, outside of chart, may appear as copied):  She reports very little. Denies pain with the exception of left-sided low back pain. Daughter reports that she has had some issues with decubitus sores but they are healing. Appetite is reasonably good, water intake fluctuates, no significant constipation, thankfully no recent fall. Motor-wise she is fairly stable. She has ongoing issues with sleep, sleep is disturbed and fragmented at night, some evidence of RBD. She doses throughout the day. Daughter and grandson are there during the weekend and during the workweek there are caretakers. Someone's at the house 24-7. Cognitively, there has been declined. She needs assistance with all ADLs and help with sequencing her tasks.   The  patient's allergies, current medications, family history, past medical history, past social history, past surgical history and problem list were reviewed and updated as  appropriate.    Previously (copied from previous notes for reference):   The daughter called in the interim on 04/19/2016 due to patient being less responsive for an hour. She was advised to take patient to the emergency room. She did not do this. She called on 04/22/2016 to request a sooner appointment. I last saw her on 02/19/2016, at which time she reported feeling weaker in her legs. She was having trouble standing up. She felt that mornings are worse in evening's. Daughter reported that gabapentin did not help very much at night. She did not have any recent falls. Daughter requested a lift chair. She had constipation, which they tried to manage with prune juice and as needed laxative. She did go 5 days without a bowel movement recently. Patient was still having issues with anxiety off and on, no significant depression and stable on Lexapro.    I saw her on 09/25/15, at which time she reported doing okay. Her sleep was a little better per daughter. Her daughter had to reduce her work hours to 30 hours per week to be able to take better care of her mom. Patient was taking Sinemet 2 pills alternating with 1-1/2 pills. She was taking Parcopa as needed. She was complaining of her legs feeling heavy. She was on Lexapro generic 10 mg daily with success. She was taking gabapentin 200 mg at night. She had not tried BuSpar as yet. Her memory was a little worse per daughter. Patient needed more verbal cues and guidance. She did not have significant constipation. She had 24-7 supervision. She had  no recent falls thankfully.   I saw her on 05/08/2015, at which time she reported still having trouble sleeping at night, her legs would feel heavy, she had trouble moving altogether. We have changed her Stalevo to generic Sinemet and entacapone secondary  to cost. She was taking Sinemet 1-1/2 pills 4 times a day and the entacapone 200 mg strength one pill 4 times a day. She had not tried the BuSpar. She had residual anxiety. She was on Lexapro. She has caretakers during the day but daughter felt overwhelmed and has to help out a lot over the weekend 2 and works full-time as well. Daughter had noticed more freezing spells in the afternoons. I suggested she start a trial of low-dose BuSpar. We also increased her Sinemet to alternate 1-1/2 pills with 2 pills, we kept the entacapone the same.   I saw her on 11/15/2014 at which time she had more numbness in her legs. She has more difficulty with bladder control. She had thankfully not fallen and anxiety was stable. She was using her walker. I suggested we continue with Stalevo 150 mg 4 times a day. She had problems with her doses and other medication intolerances in the past.   I saw her on 06/28/2014, at which time her daughter reported that things were fairly stable. Patient was reporting a cough at night. She was taking cough medicine daily. She reported postnasal drip and drooling at night. Her speech was softer. She was walking with a walker and thankfully had not fallen recently. Her appetite was good. She needed more assistance. She had a lift chair. She was on Stalevo 1 pill 4 times a day at 4 hourly intervals. She was on Sinemet CR at night. They never tried the BuSpar as the anxiety episodes or spells subsided. Her daughter suspected that these spells were in the context of a certain caretaker that cause stress to the patient. She was on Lexapro 10 mg daily. Her daughter requested FMLA paperwork so she could make it to the appointments and also be able to go home in an emergency situation.   I saw her on 03/16/2014, at which time her daughter reported ongoing issues with spells of decreased alertness. She would not lose full consciousness but became listless and limp. She would not follow commands. The  duration would be minutes to maybe 2 hours. Checking blood pressure at this times showed that she had some high blood pressure at the time. There was no convulsion, no tongue bite, no full loss of consciousness, no loss of bowel or bladder control except for one time when she did wet herself. There was no time predilection. I suggested we continue with Lexapro at 10 mg and add low-dose BuSpar for anxiety. We have done workup in the form of brain scans and EEG in the recent past. She was no longer on Parcopa as it did not help. She was not sleeping well. We kept the Sinemet CR the same at night.   I saw her on 12/07/2013, at which time I talked to the patient's daughter, [name], separately before seeing the patient. Her daughter was concerned about the patient's general decline. She had more dream enactments. The patient felt that she was getting worse and that she could not sleep at night. She was dozing off during the day. She had not fallen thankfully. She could not tolerate clonazepam. She also had side effects on low-dose Seroquel. Melatonin at 10 mg at night did not seem to help. She  was not drinking enough water. She had moved to a new home and had 3 caretakers taking turns and she was not without supervision. On the weekends her family will take turns supervising and helping. I increased her Lexapro and kept her other medications the same.    I saw her on 09/07/13, at which time her daughter reported no recent staring or zoning out spells. She was in the process of moving to a townhome with a caretaker for 4 days and 3 nights and family staying with her the rest of the time. I ordered an EEG. I also asked her to take Sinemet CR at night to get her through the night a little bit better. I started her on a low-dose Lexapro 5 mg strength. Her EEG on 09/22/2013 was reported as normal in the awake state. Her daughter requested a sooner appointment for more hallucinations and confusion. She had more memory loss.     I saw her on 06/08/2013, at which time I suggested she continue with Stalevo 150 4 times a day. She previously has had hallucinations with increased doses. I suggested adding a little extra levodopa in the form of Parcopa half a pill up to twice daily as needed. This was supposed to help with freezing. In the interim, about a month later on 07/13/2013 she was seen by Charlott Holler, NP for a sooner than scheduled appointment at which time the Parcopa dose was increased to one whole pill up to twice daily for rigidity and freezing. She presented to the emergency room on 07/15/2013 with generalized complaint of weakness and freezing and was advised to increase her Parcopa. She presented voice recently this month to the emergency room with altered sensorium, or staring spells. Workup with head CT and MRIs were negative. It was felt that these were related to Parkinson's disease. She had a brain MRI and MRA without contrast on 09/03/2013:No acute intracranial abnormality. 2. Cerebral atrophy and single remote microhemorrhage in the left frontal lobe. 3. Unremarkable head MRA. In addition, have reviewed the images through the PACS system. She had head CT without contrast on 09/03/2013:No acute intracranial pathology seen on CT. 2. Inspissated mucus filling the left maxillary sinus, and mild partial opacification of the mastoid air cells bilaterally. She had head CT without contrast on 09/01/2013: No acute intracranial pathology seen on CT. 2. Mild cortical volume loss noted. 3. Mild partial opacification of the mastoid air cells bilaterally. In addition, reviewed the images through the PACS system.   I saw her on 02/03/13, at which time I increased her Stalevo to 5 times a day. We talked about potential side effects. I asked her to stop the Seroquel and started her on low-dose clonazepam for insomnia and RBD. In the interim she was seen by our nurse practitioner, Ms. Lam on 04/08/2013, at which time I also saw her, and  we mutually decided to decrease Stalevo back to 4 times a day because of worsening confusion and hallucinations. Her clonazepam was discontinued by her PCP because of side effects. I suggested a trial of melatonin, 5-10 mg. We checked some labs including CBC, CMP, CRP, ESR and urinalysis. Labs showed no significant abnormalities, mild but stable kidney impairment was noted. She was encouraged to drink more water. She has seen her PCP in April 2015 and was advised to take her fluid pill as needed, an increase of melatonin to 10 mg.   I first met her on 10/15/2012, at which time a continued her Stalevo. I  suggested a small dose of Seroquel to help her sleep and tone down the REM behavior disorder and encouraged him to discuss with her primary care physician the addition of an antidepressant. She presents with a complaint of worsening tremors.   She has been in [facility] [address] since 10/18/12. She has a Hx of vivid dreams and tends to act out in her sleep.   She previously used to see a neurologist at [facility], when she lived in [place], [place]. She was diagnosed with PD about 12 years ago when she was still residing in [place]. She needs assistance with her ADLs. She has been living with her daughter and son-in-law and they have looked into the possibility of a long-term care facility but the patient has been resistant. She has had problems at night including sundowning, confusion, inability to sleep.   Her symptoms started on one side with tremors, but the patient was not sure which side. She has been on Stalevo for the past 2 years, and prior to that she was on C/L, and prior to that she was on Amantadine. She may not have tried a dopamine agonist or rasagiline in the past. She has been experiencing nausea with her PD medications and still has occasional nausea. In March 2014 she developed necrotizing fasciitis and needed debridement and grafting. She developed hallucinations at the time,  but was on pain medications at the time, but the [facility] persisted beyond that. She also started having memory loss then. She developed cellulitis with complications in her jaw and needed all remaining teeth removed and had IV antibiotics in mid-2014. She has no FHx of PD or dementia. She has no Hx of psychiatric premorbid illness. In 2006 she had back surgery. She has no exposure to chemicals, or agent orange. She has been an anxious person. She is not able to sleep at night. She was tried on Ambien and amitriptyline. She has difficulty with sleep onset and sleep maintenance.   She has occasional urinary incontinence, occasional constipation. She snores, and needed to have a sleep study, but did not go. She has had some dream enactments and has slid out of bed.   She had been very opposed to going into assisted living, but understood that she given her complex medical history and multiple issues and advanced Parkinson's disease she was no longer safe to live by herself.  Her Past Medical History Is Significant For: Past Medical History:  Diagnosis Date  . Benign essential hypertension   . Dementia in Parkinson's disease ([place])   . Depression   . History of necrotizing fasciitis    left leg, s/p debridement and graft  . Hypothyroidism   . Osteoarthritis, generalized   . Parkinson disease ([place])   . Spinal stenosis     Her Past Surgical History Is Significant For: Past Surgical History:  Procedure Laterality Date  . ABDOMINAL HYSTERECTOMY  1977  . SKIN DEBRIDEMENT  2014   [place], MD  . SKIN GRAFT  2014   [place]t MD  . SPINE SURGERY  2006   spinal stenosis    Her Family History Is Significant For: Family History  Problem Relation Age of Onset  . Heart disease Mother   . Heart disease Sister   . Hypertension Sister   . Stroke Sister   . Heart disease Sister        heart attack  . Cancer Sister        colon  Her Social History Is Significant For: Social History    Socioeconomic History  . Marital status: Widowed    Spouse name: None  . Number of children: 2  . Years of education: 62  . Highest education level: None  Social Needs  . Financial resource strain: None  . Food insecurity - worry: None  . Food insecurity - inability: None  . Transportation needs - medical: None  . Transportation needs - non-medical: None  Occupational History    Comment: retired   Tobacco Use  . Smoking status: Never Smoker  . Smokeless tobacco: Never Used  Substance and Sexual Activity  . Alcohol use: No    Alcohol/week: 0.0 oz  . Drug use: No  . Sexual activity: Not Currently  Other Topics Concern  . None  Social History Narrative   Patient lives in assisted living.patient is right handed    Her Allergies Are:  Allergies  Allergen Reactions  . Piperacillin-Tazobactam In Dex Rash    Unclear whether associated with clindamycin or zosyn, but probably more likely to be zosyn.   [name] Dye [Iodinated Diagnostic Agents]     Only when intravenous, not on external skin.  . Clindamycin Rash    Unclear whether patient has an actual allergy to clindamycin. On beta-lactam at the same time.  :   Her Current Medications Are:  Outpatient Encounter Medications as of 01/02/2017  Medication Sig  . acetaminophen (TYLENOL) 500 MG tablet Take 500 mg by mouth every 6 (six) hours as needed for mild pain.   Marland Kitchen aspirin EC 81 MG tablet Take 81 mg by mouth daily.  . carbidopa-levodopa (PARCOPA) 25-100 MG disintegrating tablet Take 1 tablet by mouth 2 (two) times daily as needed.  . carbidopa-levodopa (SINEMET CR) 50-200 MG tablet Take 1 tablet by mouth at bedtime.  . carbidopa-levodopa (SINEMET IR) 25-100 MG tablet Take 1.5 pills at 8 am and 12 and 2 pills at 4 pm and 2 pills at 8 pm.  . Cholecalciferol (VITAMIN D3) 2000 UNITS TABS Take 2,000 Units by mouth daily.   . entacapone (COMTAN) 200 MG tablet Take 1 tablet (200 mg total) by mouth 4 (four) times daily.  Marland Kitchen  escitalopram (LEXAPRO) 10 MG tablet Take 1 tablet (10 mg total) by mouth daily.  . hydrALAZINE (APRESOLINE) 25 MG tablet Take 1 tablet (25 mg total) by mouth 3 (three) times daily.  Marland Kitchen levothyroxine (SYNTHROID, LEVOTHROID) 25 MCG tablet Take one tablet by mouth once daily 30 minutes before breakfast for thyroid  . metoprolol succinate (TOPROL-XL) 25 MG 24 hr tablet TAKE 2 TABLETS BY MOUTH TWICE DAILY   No facility-administered encounter medications on file as of 01/02/2017.   :  Review of Systems:  Out of a complete 14 point review of systems, all are reviewed and negative with the exception of these symptoms as listed below: Review of Systems  Neurological:       Pt presents today to discuss her PD and memory. Pt is having trouble sleeping at night.    Objective:  Neurological Exam  Physical Exam Physical Examination:   Vitals:   01/02/17 0851  BP: (!) 188/90  Pulse: 68   General Examination: The patient is a very pleasant 83 y.o. female in no acute distress. She appears frail. Quiet. Answers questions appropriately with one or 2 word sentences only.   HEENT: Normocephalic, atraumatic, pupils are equal, round and reactive to light and accommodation. She has difficulty tracking particularly in the vertical gaze. She  has limitation to upper gaze. She has hearing impairment. She has moderate facial masking, she has a lower lip tremor. Neck is moderately rigid. She has edentulous speech and moderately to severely hypophonic today, mild dysarthria noted. Mild mouth dryness noted. Mallampati is class III. Tongue is central.   Chest: Clear to auscultation without wheezing, rhonchi or crackles noted.  Heart: S1+S2+0, regular and normal without murmurs, rubs or gallops noted.   Abdomen: Soft, non-tender and non-distended with normal bowel sounds appreciated on auscultation.  Extremities: There is trace edema in the distal lower extremities bilaterally. Chronic changes after skin  grafting.  Skin: Warm and dry without trophic changes noted.   Musculoskeletal: exam reveals no obvious joint deformities, tenderness or joint swelling or erythema.   Neurologically: Mental status: The patient is awake, not very alert, does not pay very close attention. She answers with one or 2 word sentences, is minimally verbal overall, his heart to understand today. Memory is impaired.   On 12/07/2013: MMSE 17/30, CDT: 3/4, AFT: 5/min  On 06/28/2014: MMSE: 15/30, CDT: 2/4, AFT: 11/min.   On 09/25/2015: MMSE: 17/30, CDT: 1/4, AFT: 7/min.  On 02/19/2016: MMSE: 16/30, CDT: 2/4, AFT: 6/min.   On 01/02/2017: MMSE: 15/30, CDT: 1/4, AFT: 3/min.  Cranial nerves are as described above under HEENT exam. In addition, she has a mildly weak shoulder shrug bilaterally.   Motor exam: Globally thin bulk, global strength of 4 out of 5, she has no dyskinesias, she has an intermittent resting tremor in the left upper extremity, no lower extremity tremors. Fine motor skills are moderate to severely impaired globally. She has moderate bradykinesia, Romberg is not testable. Reflexes are 1+ in the upper extremities and absent in the lower extremities.   Sensory exam isintact to light touch throughout.   Gait, station and balance: She stands with significant difficulty and needs assistance, she has a moderately stooped posture, stands wide-based, she uses her rolling walker. She is able to use her walker. She has no overt freezing spells. Balance is impaired.   Assessment and Plan:    In summary, Danijah Noh is a very pleasant 83 year old female with an underlying complex medical history of spinal stenosis, leg swelling, history of cellulitis of the jaw and also necrotizing fasciitis of the leg with status post skin grafting of the right leg, hypertension, depression, anxiety, osteoarthritis, and hypothyroidism as well as overweight state, who presents for follow-up consultation of her  advanced right-sided predominant Parkinson's disease, complicated by chronic constipation, dementia, history of RBD, deconditioning, global weakness, anxiety, depression, lethargy, spells of decreased attentiveness and overall physical challenges pertaining to aging. She has caregivers and her daughter has been very attentive. She has been on Comtan and Sinemet. Comtan is 4 times a day, Sinemet is 2 pills alternating with 1-1/2 pills for a total of 4 doses per day. She has been on Lexapro. She stopped the gabapentin. She had a spell of unresponsiveness during which she was not noticed to have any blood pressure issues, no convulsion or twitching, lasted about 4 hours per daughter. We have previously done an EEG in 2015 which was normal for age during the awake state and I ordered another EEG in April, but they did not go through with it. She has ongoing difficulty with sleep. Patient has been on several medications in the past including Ambien, amitriptyline, amantadine, clonazepam, Seroquel at different times with intolerances to medications or no help, melatonin did not help.  she was briefly on clonazepam, this  was stopped by PCP in early 2015. I suggested we try a very small dose again and look out for side effects such as increase in sleepiness/lethargy and/or confusion. She has 24-7 supervision. We will start with clonazepam 0.25 mg strength half a pill at night and try to increase it to 1 pill at night if possible. We will keep her medications the same, she will have insurance change and prescription pharmacy change likely with the first of the year and we will will have to probably redo all prescriptions at the time. She had been on Stalevo in the past which had to be changed to generic Sinemet and generic Comtan d/t cost. Previous workup had included EEG and brain MRI as well as MRA, mostly in 2015. I suggested we continue with the PD meds at the current doses. I suggested a 6 month routine follow-up. I  answered all their questions today and the patient and her daughter were in agreement. I spent 30 minutes in total face-to-face time with the patient, more than 50% of which was spent in counseling and coordination of care, reviewing test results, reviewing medication and discussing or reviewing the diagnosis of PD, its prognosis and treatment options. Pertinent laboratory and imaging test results that were available during this visit with the patient were reviewed by me and considered in my medical decision making (see chart for details).

## 2017-01-15 ENCOUNTER — Other Ambulatory Visit: Payer: Self-pay | Admitting: Internal Medicine

## 2017-02-19 ENCOUNTER — Other Ambulatory Visit: Payer: Self-pay

## 2017-02-19 DIAGNOSIS — R29898 Other symptoms and signs involving the musculoskeletal system: Secondary | ICD-10-CM

## 2017-02-19 DIAGNOSIS — G3183 Dementia with Lewy bodies: Secondary | ICD-10-CM

## 2017-02-19 DIAGNOSIS — G20 Parkinson's disease: Secondary | ICD-10-CM

## 2017-02-19 DIAGNOSIS — F0280 Dementia in other diseases classified elsewhere without behavioral disturbance: Secondary | ICD-10-CM

## 2017-02-19 DIAGNOSIS — F419 Anxiety disorder, unspecified: Secondary | ICD-10-CM

## 2017-02-19 MED ORDER — CARBIDOPA-LEVODOPA 25-100 MG PO TABS: ORAL_TABLET | ORAL | 0 refills | Status: DC

## 2017-02-19 MED ORDER — ENTACAPONE 200 MG PO TABS: 200.0000 mg | ORAL_TABLET | Freq: Four times a day (QID) | ORAL | 3 refills | Status: DC

## 2017-02-19 MED ORDER — CARBIDOPA-LEVODOPA 25-100 MG PO TBDP: 1.0000 | ORAL_TABLET | Freq: Two times a day (BID) | ORAL | 3 refills | Status: DC | PRN

## 2017-02-19 NOTE — Telephone Encounter (Signed)
Received a notice from [facility] that pt is requesting refills for her C/L and entacapone sent to them. Will refill.

## 2017-02-20 ENCOUNTER — Other Ambulatory Visit: Payer: Self-pay | Admitting: *Deleted

## 2017-02-20 MED ORDER — HYDRALAZINE HCL 25 MG PO TABS: 25.0000 mg | ORAL_TABLET | Freq: Three times a day (TID) | ORAL | 1 refills | Status: DC

## 2017-02-20 MED ORDER — METOPROLOL SUCCINATE ER 25 MG PO TB24: 50.0000 mg | ORAL_TABLET | Freq: Two times a day (BID) | ORAL | 1 refills | Status: DC

## 2017-02-20 MED ORDER — LEVOTHYROXINE SODIUM 25 MCG PO TABS: ORAL_TABLET | ORAL | 1 refills | Status: DC

## 2017-02-20 NOTE — Telephone Encounter (Signed)
[facility] 

## 2017-02-23 ENCOUNTER — Ambulatory Visit (INDEPENDENT_AMBULATORY_CARE_PROVIDER_SITE_OTHER): Payer: Medicare Other | Admitting: Internal Medicine

## 2017-02-23 ENCOUNTER — Other Ambulatory Visit: Payer: Self-pay

## 2017-02-23 ENCOUNTER — Encounter: Payer: Self-pay | Admitting: Internal Medicine

## 2017-02-23 VITALS — BP 160/90 | HR 61 | Temp 97.9°F | Wt 210.0 lb

## 2017-02-23 DIAGNOSIS — I10 Essential (primary) hypertension: Secondary | ICD-10-CM

## 2017-02-23 DIAGNOSIS — I5032 Chronic diastolic (congestive) heart failure: Secondary | ICD-10-CM | POA: Diagnosis not present

## 2017-02-23 DIAGNOSIS — F0280 Dementia in other diseases classified elsewhere without behavioral disturbance: Secondary | ICD-10-CM

## 2017-02-23 DIAGNOSIS — G20 Parkinson's disease: Secondary | ICD-10-CM | POA: Diagnosis not present

## 2017-02-23 DIAGNOSIS — G3183 Dementia with Lewy bodies: Secondary | ICD-10-CM

## 2017-02-23 DIAGNOSIS — R29898 Other symptoms and signs involving the musculoskeletal system: Secondary | ICD-10-CM

## 2017-02-23 DIAGNOSIS — F419 Anxiety disorder, unspecified: Secondary | ICD-10-CM

## 2017-02-23 DIAGNOSIS — L89322 Pressure ulcer of left buttock, stage 2: Secondary | ICD-10-CM

## 2017-02-23 MED ORDER — ESCITALOPRAM OXALATE 10 MG PO TABS: 10.0000 mg | ORAL_TABLET | Freq: Every day | ORAL | 3 refills | Status: DC

## 2017-02-23 NOTE — Progress Notes (Signed)
Location:  [facility] clinic Provider:  Tiffany L. [name], D.O., C.M.D.  Code Status: DNR  Goals of Care:  Advanced Directives 02/23/2017  Does Patient Have a Medical Advance Directive? -  Type of Advance Directive [place]  Does patient want to make changes to medical advance directive? No - Patient declined  Copy of [place] in Chart? Yes  Would patient like information on creating a medical advance directive? -  Pre-existing out of facility DNR order (yellow form or pink MOST form) -     Chief Complaint  Patient presents with  . Medical Management of Chronic Issues    8mth follow-up  . ACP    has durable POA    HPI: Patient is a 84 y.o. female seen today for medical management of chronic diseases.    Continuous wound on Left buttock that heals and reopens. Currently open as of two weeks ago. Pain occurs with prolong sitting and touching. Sitting more than she was last Fall. Uses recliner chair with her therapeutic pillow. Denies drainage or bleeding. There is some redness around the wound bed. Saline washes with a barrier cream with occasional use of a dressing. Is getting better over the last two weeks.   One stool daily. Voids 2-3 times day, does not void at night. Depends with pad is worn. Rarely has accidents/leakage. She is eating well. She is able to feed herself. She needs help with dressing and showering. Her daughter cooks for her. Sleeps well. No falls reported. No changes in memory loss. Is followed by neurology for Parkinson's.   Hypertension: Blood pressure was elevated at the Neurologist office in Dec as well as today in office. Caregiver states she will do that sometimes and then it will go back down. She states it is down more than up. She is taking her medications as prescribed and without trouble.    Past Medical History:  Diagnosis Date  . Benign essential hypertension   . Dementia in Parkinson's disease ([place])   . Depression    . History of necrotizing fasciitis    left leg, s/p debridement and graft  . Hypothyroidism   . Osteoarthritis, generalized   . Parkinson disease ([place])   . Spinal stenosis     Past Surgical History:  Procedure Laterality Date  . ABDOMINAL HYSTERECTOMY  1977  . SKIN DEBRIDEMENT  2014   Brambhelt, [place]  . SKIN GRAFT  2014   [place]  2006   spinal stenosis    Allergies  Allergen Reactions  . Piperacillin-Tazobactam In Dex Rash    Unclear whether associated with clindamycin or zosyn, but probably more likely to be zosyn.   [name] Dye [Iodinated Diagnostic Agents]     Only when intravenous, not on external skin.  . Clindamycin Rash    Unclear whether patient has an actual allergy to clindamycin. On beta-lactam at the same time.    Outpatient Encounter Medications as of 02/23/2017  Medication Sig  . acetaminophen (TYLENOL) 500 MG tablet Take 500 mg by mouth every 6 (six) hours as needed for mild pain.   Marland Kitchen aspirin EC 81 MG tablet Take 81 mg by mouth daily.  . carbidopa-levodopa (PARCOPA) 25-100 MG disintegrating tablet Take 1 tablet by mouth 2 (two) times daily as needed.  . carbidopa-levodopa (SINEMET CR) 50-200 MG tablet Take 1 tablet by mouth at bedtime.  . carbidopa-levodopa (SINEMET IR) 25-100 MG tablet Take 1.5 pills at 8 am  and 12 and 2 pills at 4 pm and 2 pills at 8 pm.  . Cholecalciferol (VITAMIN D3) 2000 UNITS TABS Take 2,000 Units by mouth daily.   . clonazePAM (KLONOPIN) 0.25 MG disintegrating tablet Take 1/2 pill to one pill at night for sleep  . entacapone (COMTAN) 200 MG tablet Take 1 tablet (200 mg total) by mouth 4 (four) times daily.  Marland Kitchen escitalopram (LEXAPRO) 10 MG tablet Take 1 tablet (10 mg total) by mouth daily.  . hydrALAZINE (APRESOLINE) 25 MG tablet Take 1 tablet (25 mg total) by mouth 3 (three) times daily.  Marland Kitchen levothyroxine (SYNTHROID, LEVOTHROID) 25 MCG tablet Take one tablet by mouth once daily 30 minutes before breakfast for thyroid  .  metoprolol succinate (TOPROL-XL) 25 MG 24 hr tablet Take 2 tablets (50 mg total) by mouth 2 (two) times daily.  . [DISCONTINUED] escitalopram (LEXAPRO) 10 MG tablet Take 1 tablet (10 mg total) by mouth daily.   No facility-administered encounter medications on file as of 02/23/2017.     Review of Systems:  Review of Systems  Constitutional: Negative for chills, fever and malaise/fatigue.  HENT: Positive for hearing loss.   Respiratory: Negative.   Cardiovascular: Negative.   Gastrointestinal: Negative.   Genitourinary: Negative.   Musculoskeletal: Negative for falls.  Skin:       Wound to right buttock  Neurological: Positive for tremors.       Weakness on-going, but stable  Psychiatric/Behavioral: Positive for memory loss.    Health Maintenance  Topic Date Due  . TETANUS/TDAP  02/28/1950  . DEXA SCAN  02/29/1996  . INFLUENZA VACCINE  Completed  . PNA vac Low Risk Adult  Completed    Physical Exam: Vitals:   02/23/17 1036  BP: (!) 160/90  Pulse: 61  Temp: 97.9 F (36.6 C)  TempSrc: Oral  SpO2: 95%  Weight: 210 lb (95.3 kg)    Body mass index is 36.05 kg/m. Physical Exam  Constitutional: She appears well-developed and well-nourished.  HENT:  Head: Normocephalic.  Cardiovascular: Normal rate, regular rhythm, normal heart sounds and intact distal pulses.  Pulmonary/Chest: Effort normal and breath sounds normal.  Musculoskeletal: Normal range of motion.  Neurological: She is alert.  Masked face, resting tremor, cogwheel rigidity, shuffle short steps with walking, uses rolling walker.  Skin: Skin is warm and dry.  Left buttock: Healed wound bed that had reopened with a linear slit (2-3 mm) in the very center of bed. Slit is red with scant bleeding. Scar tissue surrounds the wound from previous larger wound. There is no other noted drainage or signs of infection.     Labs reviewed: Basic Metabolic Panel: Recent Labs    06/19/16 0916 10/23/16 0947  NA 144 141  K  4.6 4.6  CL 111* 107  CO2 25 28  GLUCOSE 88 110*  BUN 21 21  CREATININE 1.38* 1.20*  CALCIUM 9.7 9.9  TSH  --  4.21   Liver Function Tests: Recent Labs    06/19/16 0916 10/23/16 0947  AST 11 12  ALT 5* 4*  ALKPHOS 64  --   BILITOT 0.8 1.0  PROT 5.7* 6.4  ALBUMIN 3.7  --    No results for input(s): LIPASE, AMYLASE in the last 8760 hours. No results for input(s): AMMONIA in the last 8760 hours. CBC: Recent Labs    06/19/16 0916 10/23/16 0947  WBC 5.9 6.4  NEUTROABS 3,894 4,326  HGB 16.4* 16.8*  HCT 50.0* 50.3*  MCV 93.6 90.8  PLT  167 177   Lipid Panel: Recent Labs    10/23/16 0947  CHOL 218*  HDL 66  TRIG 73  CHOLHDL 3.3   Lab Results  Component Value Date   HGBA1C 5.4 10/23/2016    Procedures since last visit: No results found.  Assessment/Plan  1. Parkinson disease ([place]) She is stable today on exam with no increase or worsen of symptoms. She is followed by neurology-per last note she was weaning Klonopin. Will maintain her current regimen and advise to call should there be any changes. She has a caregiver 24 hours a day for supervision and safety.   2. Benign essential hypertension Blood pressure is elevated today in office. Advised to keep a record for the next 2 weeks to see if there is a pattern. If BP at home stays 140/90 will look at changing medication regimen.   3. Chronic diastolic CHF (congestive heart failure) (HCC) No signs of fluid overload today. Overall stable. Will continue medication regimen.   4. Dementia due to Parkinson's disease without behavioral disturbance ([place]) Her memory decline is stable. Last MMSE 15/30. Her behavior per caregiver is good. She does not require treatment for this at this time.    5. Pressure injury of left buttock, stage 2 Please keep wound clean with normal saline and continue to apply Endit cream. Avoid pulling, tugging, and sliding when in chair or bed. Try and keep the wound clean of stool. Maintain  the use of pillow and change positions frequently and rotate to hips as needed.   Labs/tests ordered: none   Next appt: 06/25/2017   Karen Kays, RN, DNP [facility] [phone], [place] [address] Cell Phone (Mon-Fri 8am-5pm):  [phone] On Call:  [phone] & follow prompts after 5pm & weekends Office Phone:  [phone] Office Fax:  [phone]

## 2017-02-23 NOTE — Telephone Encounter (Signed)
Received a refill request for pt's escitalopram for [facility]. Pt is up to date on her appts and is due for a refill.

## 2017-02-23 NOTE — Patient Instructions (Addendum)
Please keep a record of BP if consistently staying elevated above 140/90 call the office an let us know so that we can address any medication changes.  Please keep wound clean with normal saline and continue to apply barrier cream. Avoid pulling, tugging, and sliding when in chair or bed. Try and keep the wound clean of stool. Maintain the use of pillow and change positions frequently and rotate to hips as needed.

## 2017-02-25 ENCOUNTER — Telehealth: Payer: Self-pay | Admitting: Neurology

## 2017-02-25 NOTE — Telephone Encounter (Signed)
The under the tongue carbidopa-levodopa or orally disintegrating carbidopa/levodopa was 25-100 milligrams strength one to up to twice daily as needed.   Immediate release Sinemet 25-100 milligrams strength should be 2 pills alternating with 1-1/2 pills for a total of 4 doses, she can take 2 at 8 am, 1-1/2 at 12, 2 at 4 pm and 1-1/2 at 8 PM.  Sinemet CR should be 1 at bedtime 50-200 milligrams strength.   In addition, she is on Comtan generic 1 pill 4 times a day 200 mg strength.   We also started her most recently on low-dose clonazepam at bedtime 0.25 mg.

## 2017-02-25 NOTE — Telephone Encounter (Signed)
Per my review of pt's records, it appears that pt is supposed to be taking as follows: Carbidopa levodopa 25-100 mg disintegrating tablet by mouth twice daily-last prescribed by [name] Carbidopa levodopa 25-100 mg  IR tablet 1.5 tablets at 8am and 12 noon, and 2 pills at 4pm and 8pm- last prescribed by [name]  Carbidopa levodopa CR 50-200 mg tablet at bedtime. (this RX was discontinued by another user on 09/30/16 for "dose change." but had been prescribed by [name])   Will send to [name] to review to make sure this RXs are accurate and that pt should continue taking the sinemet CR 50-200mg  qhs, as well as the other 2 RXs for sinemet.

## 2017-02-25 NOTE — Telephone Encounter (Signed)
Pt daughter(on DPR) has called to inform that pt mail order pharmacy was supposed to have sent pt  carbidopa-levodopa (SINEMET CR) 50-200 MG tablet Instead pt daughter states she was sent carbidopa-levodopa (SINEMET CR) 25-100mg  to be placed under her tongue.   Pt daughter is asking if Rx can be sent over since pt is about to run out. Pt daughter is asking that [facility] be faxed at [phone] with corrected prescription.  Pt daughter also asking for a call back

## 2017-02-26 MED ORDER — CARBIDOPA-LEVODOPA ER 50-200 MG PO TBCR: 1.0000 | EXTENDED_RELEASE_TABLET | Freq: Every day | ORAL | 3 refills | Status: DC

## 2017-02-26 NOTE — Telephone Encounter (Signed)
I returned Andrea Dorsey's call, sent the sinemet CR order to [facility] and reviewed pt's sinemet RXs for pt. Pt's daughter verbalized understanding.

## 2017-02-26 NOTE — Telephone Encounter (Signed)
Pt's daughter returned RN's call °

## 2017-02-26 NOTE — Telephone Encounter (Signed)
Sinemet CR order sent to [facility].   I called [name] back to discuss. No answer, left a message asking her to call me back.

## 2017-02-26 NOTE — Addendum Note (Signed)
Addended by: Lester Megargel A on: 02/26/2017 08:19 AM   Modules accepted: Orders

## 2017-03-06 DIAGNOSIS — L603 Nail dystrophy: Secondary | ICD-10-CM | POA: Diagnosis not present

## 2017-03-06 DIAGNOSIS — L84 Corns and callosities: Secondary | ICD-10-CM | POA: Diagnosis not present

## 2017-03-06 DIAGNOSIS — I739 Peripheral vascular disease, unspecified: Secondary | ICD-10-CM | POA: Diagnosis not present

## 2017-03-18 NOTE — Telephone Encounter (Signed)
Error

## 2017-05-05 ENCOUNTER — Telehealth: Payer: Self-pay | Admitting: Neurology

## 2017-05-05 DIAGNOSIS — G20 Parkinson's disease: Secondary | ICD-10-CM

## 2017-05-05 DIAGNOSIS — R6889 Other general symptoms and signs: Secondary | ICD-10-CM | POA: Diagnosis not present

## 2017-05-05 NOTE — Telephone Encounter (Signed)
FYI Pt daughter(on DPR) called the message was relayed to her and she had no questions.  No request for a call back

## 2017-05-05 NOTE — Telephone Encounter (Signed)
I spoke with Dr. Leta Baptist and he gave me a VO for the pt's lift chair.   Order faxed to [facility].  I called pt's daughter back to discuss. No answer, left a VM asking her to call me back.  If pt's daughter calls back, please advise her of this information.

## 2017-05-05 NOTE — Telephone Encounter (Signed)
Pt's daughter request rx for liftchair sent to [facility] (p) [phone]. Pt said the chair she currently has is broken. The daughter said she is at the store currently, they do have a chair she can purchase but rx will knock off the sales tax and delivery fee. She is wanting to know if this can be done asap as she is going to wait .  FYI

## 2017-05-08 ENCOUNTER — Other Ambulatory Visit: Payer: Self-pay | Admitting: Neurology

## 2017-05-08 DIAGNOSIS — G20 Parkinson's disease: Secondary | ICD-10-CM

## 2017-06-03 DIAGNOSIS — L84 Corns and callosities: Secondary | ICD-10-CM | POA: Diagnosis not present

## 2017-06-03 DIAGNOSIS — I739 Peripheral vascular disease, unspecified: Secondary | ICD-10-CM | POA: Diagnosis not present

## 2017-06-03 DIAGNOSIS — L603 Nail dystrophy: Secondary | ICD-10-CM | POA: Diagnosis not present

## 2017-06-25 ENCOUNTER — Ambulatory Visit: Payer: Medicare Other | Admitting: Internal Medicine

## 2017-07-16 ENCOUNTER — Ambulatory Visit (INDEPENDENT_AMBULATORY_CARE_PROVIDER_SITE_OTHER): Payer: Medicare Other | Admitting: Internal Medicine

## 2017-07-16 ENCOUNTER — Encounter: Payer: Self-pay | Admitting: Internal Medicine

## 2017-07-16 VITALS — BP 130/70 | HR 67 | Temp 97.9°F | Ht 64.0 in | Wt 206.0 lb

## 2017-07-16 DIAGNOSIS — E039 Hypothyroidism, unspecified: Secondary | ICD-10-CM | POA: Diagnosis not present

## 2017-07-16 DIAGNOSIS — D45 Polycythemia vera: Secondary | ICD-10-CM

## 2017-07-16 DIAGNOSIS — S30810A Abrasion of lower back and pelvis, initial encounter: Secondary | ICD-10-CM | POA: Diagnosis not present

## 2017-07-16 DIAGNOSIS — I5032 Chronic diastolic (congestive) heart failure: Secondary | ICD-10-CM

## 2017-07-16 DIAGNOSIS — G20 Parkinson's disease: Secondary | ICD-10-CM | POA: Diagnosis not present

## 2017-07-16 DIAGNOSIS — I10 Essential (primary) hypertension: Secondary | ICD-10-CM

## 2017-07-16 DIAGNOSIS — R739 Hyperglycemia, unspecified: Secondary | ICD-10-CM | POA: Diagnosis not present

## 2017-07-16 DIAGNOSIS — G20A1 Parkinson's disease without dyskinesia, without mention of fluctuations: Secondary | ICD-10-CM

## 2017-07-16 NOTE — Progress Notes (Signed)
Location:  [facility] clinic Provider:  Burnette Valenti L. [name], D.O., C.M.D.  Code Status: DNR Goals of Care:  Advanced Directives 07/16/2017  Does Patient Have a Medical Advance Directive? Yes  Type of Advance Directive [place]  Does patient want to make changes to medical advance directive? No - Patient declined  Copy of [place] in Chart? Yes  Would patient like information on creating a medical advance directive? -  Pre-existing out of facility DNR order (yellow form or pink MOST form) -     Chief Complaint  Patient presents with  . Medical Management of Chronic Issues    51mth follow-up    HPI: Patient is a 84 y.o. female seen today for medical management of chronic diseases.    Pressure ulcer flared up for at least a week.  Her daughter's been putting endit on it.  It does hurt when she sits.  They're using a pressure cushion, donut cushion.    BP at goal.  May go to 160s occasionally at home, but usually around 130s.    PD:  Remains on her sinemet.  [name] is noticing that she has more rigidity and freezing than she did have so getting more parcopa in the morning.  She had a slide and she eased down to the floor, but no injury.    Constipation; doing fine.  Sleep about the same.  Some nights she'll sleep through till the morning.  No longer taking sleep medications.    Both lower legs hurt.  Muscles are sore.  She leans to the side despite attempts to prop her up and that makes her neck sore.    Wt is down 4 lbs from feb.    Past Medical History:  Diagnosis Date  . Benign essential hypertension   . Dementia in Parkinson's disease ([place])   . Depression   . History of necrotizing fasciitis    left leg, s/p debridement and graft  . Hypothyroidism   . Osteoarthritis, generalized   . Parkinson disease ([place])   . Spinal stenosis     Past Surgical History:  Procedure Laterality Date  . ABDOMINAL HYSTERECTOMY  1977  . SKIN DEBRIDEMENT   2014   Brambhelt, [place]  . SKIN GRAFT  2014   [place]  2006   spinal stenosis    Allergies  Allergen Reactions  . Piperacillin-Tazobactam In Dex Rash    Unclear whether associated with clindamycin or zosyn, but probably more likely to be zosyn.   [name] Dye [Iodinated Diagnostic Agents]     Only when intravenous, not on external skin.  . Clindamycin Rash    Unclear whether patient has an actual allergy to clindamycin. On beta-lactam at the same time.    Outpatient Encounter Medications as of 07/16/2017  Medication Sig  . acetaminophen (TYLENOL) 500 MG tablet Take 500 mg by mouth every 6 (six) hours as needed for mild pain.   Marland Kitchen aspirin EC 81 MG tablet Take 81 mg by mouth daily.  . carbidopa-levodopa (PARCOPA) 25-100 MG disintegrating tablet Take 1 tablet by mouth 2 (two) times daily as needed.  . carbidopa-levodopa (SINEMET CR) 50-200 MG tablet Take 1 tablet by mouth at bedtime.  . carbidopa-levodopa (SINEMET IR) 25-100 MG tablet TAKE 1 AND 1/2 TABLETS BY  MOUTH AT 8AM AND 12PM, 2  TABLETS AT 4PM, AND 2  TABLETS AT 8PM  . Cholecalciferol (VITAMIN D3) 2000 UNITS TABS Take 2,000 Units by  mouth daily.   . clonazePAM (KLONOPIN) 0.25 MG disintegrating tablet Take 1/2 pill to one pill at night for sleep  . entacapone (COMTAN) 200 MG tablet Take 1 tablet (200 mg total) by mouth 4 (four) times daily.  Marland Kitchen escitalopram (LEXAPRO) 10 MG tablet Take 1 tablet (10 mg total) by mouth daily.  . hydrALAZINE (APRESOLINE) 25 MG tablet Take 1 tablet (25 mg total) by mouth 3 (three) times daily.  Marland Kitchen levothyroxine (SYNTHROID, LEVOTHROID) 25 MCG tablet Take one tablet by mouth once daily 30 minutes before breakfast for thyroid  . metoprolol succinate (TOPROL-XL) 25 MG 24 hr tablet Take 2 tablets (50 mg total) by mouth 2 (two) times daily.   No facility-administered encounter medications on file as of 07/16/2017.     Review of Systems:  ROS  Health Maintenance  Topic Date Due  .  TETANUS/TDAP  02[birth year]28/1950  . DEXA SCAN  02/29/1996  . INFLUENZA VACCINE  08/20/2017  . PNA vac Low Risk Adult  Completed    Physical Exam: Vitals:   07/16/17 1329  BP: 130/70  Pulse: 67  Temp: 97.9 F (36.6 C)  TempSrc: Oral  SpO2: 93%  Weight: 206 lb (93.4 kg)  Height: 5\' 4"  (1.626 m)   Body mass index is 35.36 kg/m. Physical Exam  Labs reviewed: Basic Metabolic Panel: Recent Labs    10/23/16 0947  NA 141  K 4.6  CL 107  CO2 28  GLUCOSE 110*  BUN 21  CREATININE 1.20*  CALCIUM 9.9  TSH 4.21   Liver Function Tests: Recent Labs    10/23/16 0947  AST 12  ALT 4*  BILITOT 1.0  PROT 6.4   No results for input(s): LIPASE, AMYLASE in the last 8760 hours. No results for input(s): AMMONIA in the last 8760 hours. CBC: Recent Labs    10/23/16 0947  WBC 6.4  NEUTROABS 4,326  HGB 16.8*  HCT 50.3*  MCV 90.8  PLT 177   Lipid Panel: Recent Labs    10/23/16 0947  CHOL 218*  HDL 66  LDLCALC 135*  TRIG 73  CHOLHDL 3.3   Lab Results  Component Value Date   HGBA1C 5.4 10/23/2016   Assessment/Plan 1. Parkinson disease ([place]) -more freezing, cont regimen as ordered, f/u with [name] as planned  2. Morbid (severe) obesity due to excess calories (HCC) -down 4 lbs, eats a lot of treats she enjoys, inactive  3. Polycythemia vera ([place]) - f/u lab: - CBC with Differential/Platelet  4. Abrasion of left buttock, initial encounter -left buttock had prior pressure ulcer, but healed--has scar tissue now that looks like it got scraped during wiping or bathing -keep covered with cushioned dressing -cont endit for barrier cream -use wipes rather than dry paper to clean  5. Chronic diastolic CHF (congestive heart failure) (HCC) -cont current regimen, no signs of acute exacerbation  6. Benign essential hypertension -bp at goal today and typically at home per her daughter - COMPLETE METABOLIC PANEL WITH GFR  7. Hyperglycemia -ongoing, f/u lab - COMPLETE  METABOLIC PANEL WITH GFR - Hemoglobin A1c  8. Hypothyroidism, unspecified type -cont current levothyroxine - f/u TSH  Labs/tests ordered:  Orders Placed This Encounter  Procedures  . CBC with Differential/Platelet  . COMPLETE METABOLIC PANEL WITH GFR  . Hemoglobin A1c  . TSH    Next appt:  6 mos med mgt  Ariyon Gerstenberger L. Aalyiah Camberos, D.O. [facility] [address], [place] [address] Cell Phone (  Mon-Fri 8am-5pm):  [phone] On Call:  [phone] & follow prompts after 5pm & weekends Office Phone:  [phone] Office Fax:  [phone]

## 2017-07-17 LAB — COMPLETE METABOLIC PANEL WITH GFR
AG Ratio: 1.8 (calc) (ref 1.0–2.5)
ALT: 3 U/L — ABNORMAL LOW (ref 6–29)
AST: 11 U/L (ref 10–35)
Albumin: 4 g/dL (ref 3.6–5.1)
Alkaline phosphatase (APISO): 69 U/L (ref 33–130)
BUN/Creatinine Ratio: 17 (calc) (ref 6–22)
BUN: 24 mg/dL (ref 7–25)
CO2: 28 mmol/L (ref 20–32)
Calcium: 9.7 mg/dL (ref 8.6–10.4)
Chloride: 108 mmol/L (ref 98–110)
Creat: 1.39 mg/dL — ABNORMAL HIGH (ref 0.60–0.88)
GFR, Est African American: 40 mL/min/{1.73_m2} — ABNORMAL LOW (ref >=60)
GFR, Est Non African American: 34 mL/min/{1.73_m2} — ABNORMAL LOW (ref >=60)
Globulin: 2.2 g/dL (calc) (ref 1.9–3.7)
Glucose, Bld: 109 mg/dL (ref 65–139)
Potassium: 4.2 mmol/L (ref 3.5–5.3)
Sodium: 142 mmol/L (ref 135–146)
Total Bilirubin: 1.3 mg/dL — ABNORMAL HIGH (ref 0.2–1.2)
Total Protein: 6.2 g/dL (ref 6.1–8.1)

## 2017-07-17 LAB — CBC WITH DIFFERENTIAL/PLATELET
Basophils Absolute: 30 cells/uL (ref 0–200)
Basophils Relative: 0.5 %
Eosinophils Absolute: 90 cells/uL (ref 15–500)
Eosinophils Relative: 1.5 %
HCT: 50.4 % — ABNORMAL HIGH (ref 35.0–45.0)
Hemoglobin: 16.7 g/dL — ABNORMAL HIGH (ref 11.7–15.5)
Lymphs Abs: 1260 cells/uL (ref 850–3900)
MCH: 30.9 pg (ref 27.0–33.0)
MCHC: 33.1 g/dL (ref 32.0–36.0)
MCV: 93.2 fL (ref 80.0–100.0)
MPV: 11.3 fL (ref 7.5–12.5)
Monocytes Relative: 11 %
Neutro Abs: 3960 cells/uL (ref 1500–7800)
Neutrophils Relative %: 66 %
Platelets: 152 10*3/uL (ref 140–400)
RBC: 5.41 10*6/uL — ABNORMAL HIGH (ref 3.80–5.10)
RDW: 13 % (ref 11.0–15.0)
Total Lymphocyte: 21 %
WBC mixed population: 660 cells/uL (ref 200–950)
WBC: 6 10*3/uL (ref 3.8–10.8)

## 2017-07-17 LAB — TSH: TSH: 3.52 mIU/L (ref 0.40–4.50)

## 2017-07-17 LAB — HEMOGLOBIN A1C
Hgb A1c MFr Bld: 5.4 % of total Hgb (ref <5.7)
Mean Plasma Glucose: 108 (calc)
eAG (mmol/L): 6 (calc)

## 2017-07-24 ENCOUNTER — Other Ambulatory Visit: Payer: Self-pay | Admitting: Internal Medicine

## 2017-07-24 ENCOUNTER — Telehealth: Payer: Self-pay | Admitting: *Deleted

## 2017-07-24 NOTE — Telephone Encounter (Signed)
Daughter called and said patient c/o burning with urination and bladder pressure x1 day. No fever, hematuria or abd pain. No appts available today in office. Advised to increase fluid intake for now and I would call back with your suggestions. Advised if worsening to go to [facility].

## 2017-07-24 NOTE — Telephone Encounter (Signed)
Message left advising patient's daughter.  

## 2017-07-24 NOTE — Telephone Encounter (Signed)
He really needs UA done - recommend he be seen at local  urgent care

## 2017-08-15 ENCOUNTER — Other Ambulatory Visit: Payer: Self-pay | Admitting: Neurology

## 2017-08-15 ENCOUNTER — Other Ambulatory Visit: Payer: Self-pay | Admitting: Internal Medicine

## 2017-08-15 DIAGNOSIS — G20 Parkinson's disease: Secondary | ICD-10-CM

## 2017-09-04 DIAGNOSIS — I739 Peripheral vascular disease, unspecified: Secondary | ICD-10-CM | POA: Diagnosis not present

## 2017-09-04 DIAGNOSIS — L84 Corns and callosities: Secondary | ICD-10-CM | POA: Diagnosis not present

## 2017-09-04 DIAGNOSIS — L603 Nail dystrophy: Secondary | ICD-10-CM | POA: Diagnosis not present

## 2017-09-28 ENCOUNTER — Encounter

## 2017-09-28 ENCOUNTER — Encounter: Payer: Self-pay | Admitting: Neurology

## 2017-09-28 ENCOUNTER — Ambulatory Visit: Payer: Medicare Other | Admitting: Neurology

## 2017-09-28 VITALS — BP 171/84 | HR 65 | Ht 64.0 in | Wt 202.0 lb

## 2017-09-28 DIAGNOSIS — G4752 REM sleep behavior disorder: Secondary | ICD-10-CM | POA: Diagnosis not present

## 2017-09-28 DIAGNOSIS — R6889 Other general symptoms and signs: Secondary | ICD-10-CM | POA: Diagnosis not present

## 2017-09-28 DIAGNOSIS — G20 Parkinson's disease: Secondary | ICD-10-CM

## 2017-09-28 DIAGNOSIS — G479 Sleep disorder, unspecified: Secondary | ICD-10-CM | POA: Diagnosis not present

## 2017-09-28 DIAGNOSIS — F0280 Dementia in other diseases classified elsewhere without behavioral disturbance: Secondary | ICD-10-CM

## 2017-09-28 DIAGNOSIS — G3183 Dementia with Lewy bodies: Secondary | ICD-10-CM

## 2017-09-28 NOTE — Patient Instructions (Addendum)
Your exam is about the same, you do have several challenges, which are undeniable.  Nevertheless, I would like for you to keep taking your Sinemet immediate release 2 pills alternating with 1-1/2 pills for 4 doses, Comtan generic for 4 doses and Sinemet long-acting at bedtime. Please continue to try to move with your walker and with supervision.  Please eat well and drink enough water, about 6 cups, if possible.  I will see you back in 6 months.

## 2017-09-28 NOTE — Progress Notes (Signed)
Subjective:    Patient ID: Andrea Dorsey is a 84 y.o. female.  HPI     Interim history:   Andrea Dorsey is a very pleasant 84 year old right-handed woman with an underlying complex medical history of spinal stenosis, necrotizing fasciitis, status post debridement and grafting on the right lower leg, lower extremity edema, insomnia, hypothyroidism, osteoarthritis, hypertension, depression, and obesity, who presents for followup consultation of her advanced, right-sided predominant Parkinson's disease, complicated by hallucinations, memory loss, mood disorder including anxiety and depression, sleep disorder including insomnia, OSA and RBD, lethargy, and complications pertaining to deconditioning and advancing age. She is accompanied by her daughter again today. I last saw her on 01/02/2017, at which time she had some lower back pain and some issues with decubitus sores. They were healing but recurring. She had reasonably good appetite. She had sleep difficulty at night and was napping throughout the day. She had cognitive decline over time. She was requiring assistance with all her ADLs. I suggested that we retry clonazepam in low-dose but she either did not try it or stopped it after trying it.   Today, 09/28/17: (all dictated new, as well as above notes, some dictation done in note pad or Word, outside of chart, may appear as copied):  She reports very little, complains of difficulty walking, some times she can not move at all. She feels like she wants to walk but she is not always able to. Daughter has noted more freezing spells. She has visual hallucinations which are not every day. She does have treatment enactments, not every day. She still has episodes of unresponsiveness during which she has a stable blood pressure but is not able to follow commands. She had a recent cold during which she became very lethargic and slept almost 12 hours at a time.   The patient's allergies, current medications,  family history, past medical history, past social history, past surgical history and problem list were reviewed and updated as  appropriate.    Previously (copied from previous notes for reference):    I saw her on 05/01/2016 for a sooner than scheduled appointment because of an episode of altered mental status. Her daughter called on 04/19/2016 reporting that patient was less responsive for about an hour. We had advised her daughter to take patient to the emergency room. I ordered an EEG. She did not have the EEG.    The daughter called in the interim on 04/19/2016 due to patient being less responsive for an hour. She was advised to take patient to the emergency room. She did not do this. She called on 04/22/2016 to request a sooner appointment. I last saw her on 02/19/2016, at which time she reported feeling weaker in her legs. She was having trouble standing up. She felt that mornings are worse in evening's. Daughter reported that gabapentin did not help very much at night. She did not have any recent falls. Daughter requested a lift chair. She had constipation, which they tried to manage with prune juice and as needed laxative. She did go 5 days without a bowel movement recently. Patient was still having issues with anxiety off and on, no significant depression and stable on Lexapro.   I saw her on 09/25/15, at which time she reported doing okay. Her sleep was a little better per daughter. Her daughter had to reduce her work hours to 30 hours per week to be able to take better care of her mom. Patient was taking Sinemet 2 pills alternating with  1-1/2 pills. She was taking Parcopa as needed. She was complaining of her legs feeling heavy. She was on Lexapro generic 10 mg daily with success. She was taking gabapentin 200 mg at night. She had not tried BuSpar as yet. Her memory was a little worse per daughter. Patient needed more verbal cues and guidance. She did not have significant constipation. She had  24-7 supervision. She had no recent falls thankfully.   I saw her on 05/08/2015, at which time she reported still having trouble sleeping at night, her legs would feel heavy, she had trouble moving altogether. We have changed her Stalevo to generic Sinemet and entacapone secondary to cost. She was taking Sinemet 1-1/2 pills 4 times a day and the entacapone 200 mg strength one pill 4 times a day. She had not tried the BuSpar. She had residual anxiety. She was on Lexapro. She has caretakers during the day but daughter felt overwhelmed and has to help out a lot over the weekend 2 and works full-time as well. Daughter had noticed more freezing spells in the afternoons. I suggested she start a trial of low-dose BuSpar. We also increased her Sinemet to alternate 1-1/2 pills with 2 pills, we kept the entacapone the same.   I saw her on 11/15/2014 at which time she had more numbness in her legs. She has more difficulty with bladder control. She had thankfully not fallen and anxiety was stable. She was using her walker. I suggested we continue with Stalevo 150 mg 4 times a day. She had problems with her doses and other medication intolerances in the past.   I saw her on 06/28/2014, at which time her daughter reported that things were fairly stable. Patient was reporting a cough at night. She was taking cough medicine daily. She reported postnasal drip and drooling at night. Her speech was softer. She was walking with a walker and thankfully had not fallen recently. Her appetite was good. She needed more assistance. She had a lift chair. She was on Stalevo 1 pill 4 times a day at 4 hourly intervals. She was on Sinemet CR at night. They never tried the BuSpar as the anxiety episodes or spells subsided. Her daughter suspected that these spells were in the context of a certain caretaker that cause stress to the patient. She was on Lexapro 10 mg daily. Her daughter requested FMLA paperwork so she could make it to the  appointments and also be able to go home in an emergency situation.   I saw her on 03/16/2014, at which time her daughter reported ongoing issues with spells of decreased alertness. She would not lose full consciousness but became listless and limp. She would not follow commands. The duration would be minutes to maybe 2 hours. Checking blood pressure at this times showed that she had some high blood pressure at the time. There was no convulsion, no tongue bite, no full loss of consciousness, no loss of bowel or bladder control except for one time when she did wet herself. There was no time predilection. I suggested we continue with Lexapro at 10 mg and add low-dose BuSpar for anxiety. We have done workup in the form of brain scans and EEG in the recent past. She was no longer on Parcopa as it did not help. She was not sleeping well. We kept the Sinemet CR the same at night.   I saw her on 12/07/2013, at which time I talked to the patient's daughter, [name], separately before seeing the  patient. Her daughter was concerned about the patient's general decline. She had more dream enactments. The patient felt that she was getting worse and that she could not sleep at night. She was dozing off during the day. She had not fallen thankfully. She could not tolerate clonazepam. She also had side effects on low-dose Seroquel. Melatonin at 10 mg at night did not seem to help. She was not drinking enough water. She had moved to a new home and had 3 caretakers taking turns and she was not without supervision. On the weekends her family will take turns supervising and helping. I increased her Lexapro and kept her other medications the same.    I saw her on 09/07/13, at which time her daughter reported no recent staring or zoning out spells. She was in the process of moving to a townhome with a caretaker for 4 days and 3 nights and family staying with her the rest of the time. I ordered an EEG. I also asked her to take Sinemet  CR at night to get her through the night a little bit better. I started her on a low-dose Lexapro 5 mg strength. Her EEG on 09/22/2013 was reported as normal in the awake state. Her daughter requested a sooner appointment for more hallucinations and confusion. She had more memory loss.    I saw her on 06/08/2013, at which time I suggested she continue with Stalevo 150 4 times a day. She previously has had hallucinations with increased doses. I suggested adding a little extra levodopa in the form of Parcopa half a pill up to twice daily as needed. This was supposed to help with freezing. In the interim, about a month later on 07/13/2013 she was seen by Charlott Holler, NP for a sooner than scheduled appointment at which time the Parcopa dose was increased to one whole pill up to twice daily for rigidity and freezing. She presented to the emergency room on 07/15/2013 with generalized complaint of weakness and freezing and was advised to increase her Parcopa. She presented voice recently this month to the emergency room with altered sensorium, or staring spells. Workup with head CT and MRIs were negative. It was felt that these were related to Parkinson's disease. She had a brain MRI and MRA without contrast on 09/03/2013:No acute intracranial abnormality. 2. Cerebral atrophy and single remote microhemorrhage in the left frontal lobe. 3. Unremarkable head MRA. In addition, have reviewed the images through the PACS system. She had head CT without contrast on 09/03/2013:No acute intracranial pathology seen on CT. 2. Inspissated mucus filling the left maxillary sinus, and mild partial opacification of the mastoid air cells bilaterally. She had head CT without contrast on 09/01/2013: No acute intracranial pathology seen on CT. 2. Mild cortical volume loss noted. 3. Mild partial opacification of the mastoid air cells bilaterally. In addition, reviewed the images through the PACS system.   I saw her on 02/03/13, at which time I  increased her Stalevo to 5 times a day. We talked about potential side effects. I asked her to stop the Seroquel and started her on low-dose clonazepam for insomnia and RBD. In the interim she was seen by our nurse practitioner, Ms. Lam on 04/08/2013, at which time I also saw her, and we mutually decided to decrease Stalevo back to 4 times a day because of worsening confusion and hallucinations. Her clonazepam was discontinued by her PCP because of side effects. I suggested a trial of melatonin, 5-10 mg. We checked some  labs including CBC, CMP, CRP, ESR and urinalysis. Labs showed no significant abnormalities, mild but stable kidney impairment was noted. She was encouraged to drink more water. She has seen her PCP in April 2015 and was advised to take her fluid pill as needed, an increase of melatonin to 10 mg.   I first met her on 10/15/2012, at which time a continued her Stalevo. I suggested a small dose of Seroquel to help her sleep and tone down the REM behavior disorder and encouraged him to discuss with her primary care physician the addition of an antidepressant. She presents with a complaint of worsening tremors.   She has been in [facility] [address] since 10/18/12. She has a Hx of vivid dreams and tends to act out in her sleep.   She previously used to see a neurologist at [facility], when she lived in [place], [place]. She was diagnosed with PD about 12 years ago when she was still residing in [place]. She needs assistance with her ADLs. She has been living with her daughter and son-in-law and they have looked into the possibility of a long-term care facility but the patient has been resistant. She has had problems at night including sundowning, confusion, inability to sleep.   Her symptoms started on one side with tremors, but the patient was not sure which side. She has been on Stalevo for the past 2 years, and prior to that she was on C/L, and prior to that she was on Amantadine. She  may not have tried a dopamine agonist or rasagiline in the past. She has been experiencing nausea with her PD medications and still has occasional nausea. In March 2014 she developed necrotizing fasciitis and needed debridement and grafting. She developed hallucinations at the time, but was on pain medications at the time, but the [facility] persisted beyond that. She also started having memory loss then. She developed cellulitis with complications in her jaw and needed all remaining teeth removed and had IV antibiotics in mid-2014. She has no FHx of PD or dementia. She has no Hx of psychiatric premorbid illness. In 2006 she had back surgery. She has no exposure to chemicals, or agent orange. She has been an anxious person. She is not able to sleep at night. She was tried on Ambien and amitriptyline. She has difficulty with sleep onset and sleep maintenance.   She has occasional urinary incontinence, occasional constipation. She snores, and needed to have a sleep study, but did not go. She has had some dream enactments and has slid out of bed.   She had been very opposed to going into assisted living, but understood that she given her complex medical history and multiple issues and advanced Parkinson's disease she was no longer safe to live by herself.    Her Past Medical History Is Significant For: Past Medical History:  Diagnosis Date  . Benign essential hypertension   . Dementia in Parkinson's disease ([place])   . Depression   . History of necrotizing fasciitis    left leg, s/p debridement and graft  . Hypothyroidism   . Osteoarthritis, generalized   . Parkinson disease ([place])   . Spinal stenosis     Her Past Surgical History Is Significant For: Past Surgical History:  Procedure Laterality Date  . ABDOMINAL HYSTERECTOMY  1977  . SKIN DEBRIDEMENT  2014   [place], [place]  . SKIN GRAFT  2014   Kathleen  2006  spinal stenosis    Her Family History Is Significant For: Family  History  Problem Relation Age of Onset  . Heart disease Mother   . Heart disease Sister   . Hypertension Sister   . Stroke Sister   . Heart disease Sister        heart attack  . Cancer Sister        colon    Her Social History Is Significant For: Social History   Socioeconomic History  . Marital status: Widowed    Spouse name: Not on file  . Number of children: 2  . Years of education: 60  . Highest education level: Not on file  Occupational History    Comment: retired   Scientific laboratory technician  . Financial resource strain: Not on file  . Food insecurity:    Worry: Not on file    Inability: Not on file  . Transportation needs:    Medical: Not on file    Non-medical: Not on file  Tobacco Use  . Smoking status: Never Smoker  . Smokeless tobacco: Never Used  Substance and Sexual Activity  . Alcohol use: No    Alcohol/week: 0.0 standard drinks  . Drug use: No  . Sexual activity: Not Currently  Lifestyle  . Physical activity:    Days per week: Not on file    Minutes per session: Not on file  . Stress: Not on file  Relationships  . Social connections:    Talks on phone: Not on file    Gets together: Not on file    Attends religious service: Not on file    Active member of club or organization: Not on file    Attends meetings of clubs or organizations: Not on file    Relationship status: Not on file  Other Topics Concern  . Not on file  Social History Narrative   Patient lives in assisted living.patient is right handed    Her Allergies Are:  Allergies  Allergen Reactions  . Piperacillin-Tazobactam In Dex Rash    Unclear whether associated with clindamycin or zosyn, but probably more likely to be zosyn.   [name] Dye [Iodinated Diagnostic Agents]     Only when intravenous, not on external skin.  . Clindamycin Rash    Unclear whether patient has an actual allergy to clindamycin. On beta-lactam at the same time.  :   Her Current Medications Are:  Outpatient Encounter  Medications as of 09/28/2017  Medication Sig  . acetaminophen (TYLENOL) 500 MG tablet Take 500 mg by mouth every 6 (six) hours as needed for mild pain.   Marland Kitchen aspirin EC 81 MG tablet Take 81 mg by mouth daily.  . carbidopa-levodopa (PARCOPA) 25-100 MG disintegrating tablet Take 1 tablet by mouth 2 (two) times daily as needed.  . carbidopa-levodopa (SINEMET CR) 50-200 MG tablet Take 1 tablet by mouth at bedtime.  . carbidopa-levodopa (SINEMET IR) 25-100 MG tablet TAKE 1 AND 1/2 TABLETS BY  MOUTH AT 8AM AND 12PM 2  TABLETS AT 4PM AND 2  TABLETS AT 8PM  . Cholecalciferol (VITAMIN D3) 2000 UNITS TABS Take 2,000 Units by mouth daily.   . entacapone (COMTAN) 200 MG tablet Take 1 tablet (200 mg total) by mouth 4 (four) times daily.  Marland Kitchen escitalopram (LEXAPRO) 10 MG tablet Take 1 tablet (10 mg total) by mouth daily.  . hydrALAZINE (APRESOLINE) 25 MG tablet TAKE 1 TABLET BY MOUTH 3  TIMES DAILY  . levothyroxine (SYNTHROID, LEVOTHROID) 25 MCG tablet  TAKE ONE TABLET BY MOUTH  ONCE DAILY 30 MINUTES  BEFORE BREAKFAST FOR  THYROID  . metoprolol succinate (TOPROL-XL) 25 MG 24 hr tablet TAKE 2 TABLETS BY MOUTH TWO TIMES DAILY  . [DISCONTINUED] clonazePAM (KLONOPIN) 0.25 MG disintegrating tablet Take 1/2 pill to one pill at night for sleep   No facility-administered encounter medications on file as of 09/28/2017.   :  Review of Systems:  Out of a complete 14 point review of systems, all are reviewed and negative with the exception of these symptoms as listed below: Review of Systems  Neurological:       Pt presents today to follow up on her PD. Pt has had a couple of "slides" where she started to fall but was assisted to the floor.    Objective:  Neurological Exam  Physical Exam Physical Examination:   Vitals:   09/28/17 1042  BP: (!) 171/84  Pulse: 65    General Examination: The patient is a very pleasant 84 y.o. female in no acute distress. She appears frail and deconditioned, minimally verbal.    HEENT:Normocephalic, atraumatic, pupils are equal, round and reactive to light and accommodation. She has difficulty tracking particularly in the vertical gaze. She has limitation to upper gaze. She has hearing impairment. She has moderate facial masking, she has a lower lip tremor. Neck is moderately rigid. She has edentulous speech and moderately to severely hypophonic today, moderate dysarthria noted. Mild mouth dryness noted. Mallampati is class III. Tongue is central.  Chest:Clear to auscultation without wheezing, rhonchi or crackles noted.  Heart:S1+S2+0, regular and normal without murmurs, rubs or gallops noted.   Abdomen:Soft, non-tender and non-distended with normal bowel sounds appreciated on auscultation.  Extremities:There istrace edema in the distal lower extremities bilaterally. Chronic changes after skin grafting lower ext.  Skin: Warm and dry without trophic changes noted.   Musculoskeletal: exam reveals no obvious joint deformities, tenderness or joint swelling or erythema.  Neurologically: Mental status: The patient is awake,not very alert, does not pay very close attention. She answers with one or 2 word sentences, is minimally verbal overall, his hard to understand today. Memory is impaired.  On 12/07/2013: MMSE 17/30, CDT: 3/4, AFT: 5/min  On 06/28/2014: MMSE: 15/30, CDT: 2/4, AFT: 11/min.   On 09/25/2015: MMSE: 17/30, CDT: 1/4, AFT: 7/min.  On 02/19/2016: MMSE: 16/30, CDT: 2/4, AFT: 6/min.   On 01/02/2017: MMSE: 15/30, CDT: 1/4, AFT: 3/min.  On 09/28/2017: MMSE: 13/30, CDT: 1/4, AFT: 4/min.  Cranial nerves are as described above under HEENT exam. In addition, she has a mildly weak shoulder shrug bilaterally.   Motor exam:Globallythin bulk, global strength of 4 out of 5, she has no dyskinesias, she has an intermittent resting tremor in the left upper extremity, no lower extremity tremors. Fine motor skills are moderate to severely impaired  globally. She has moderate bradykinesia, Romberg is not testable. Reflexes are 1+ in the upper extremities and absent in the lower extremities.   Sensory exam isintact to light touch throughout.   Gait, station and balance: She stands with significant difficulty and needs assistance, she has a moderately stooped posture, stands wide-based,she uses her rolling walker. She is able to use her walker. Balance is impaired.  Assessmentand Plan:   In summary,Andrea Hoggardis a very pleasant 57 year oldfemalewith an underlyingcomplex medical history of spinal stenosis, leg swelling, history of cellulitis of the jaw and also necrotizing fasciitis of the leg with status post skin grafting of the right leg, hypertension, depression, anxiety, osteoarthritis, and  hypothyroidism as well as overweight state, who presents for follow-up consultation of her advanced right-sided predominant Parkinson's disease, complicated by chronic constipation, dementia, history of RBD, deconditioning, global weakness, anxiety, depression,lethargy, spells of decreased attentiveness and overall physical challenges pertaining to aging and progression of PD. She has caregivers and her daughter has been very attentive. She has been on Comtan and Sinemet.she is on Sinemet CR at bedtime. She is no longer on clonazepam. Comtan is 4 times a day, Sinemet is 2 pills alternating with 1-1/2 pills for a total of 4 doses per day. She has been on Lexapro. She stopped the gabapentin. She had a spell of unresponsiveness during which she was not noticed to have any blood pressure issues, no convulsion or twitching, can last a few hours even. No obvious triggers, no pattern. We have previously done an EEG in 2015 which was normal for age during the awake state and I ordered another EEG in April, but they did not go through with it. She has ongoing difficulty with sleep. Patient has been on several medications in the past including Ambien,  amitriptyline, amantadine, clonazepam, Seroquel at different times with intolerances to medications or no help, melatonin also did not help. She was briefly on clonazepam, this was stopped by PCP in early 2015. I suggested we re-try a very small dose again but she either did not try it or stopped it, daughter is not sure. She has 24-7 supervision. No recent falls, but more of a forced sit down event, thankfully no injuries. She had been on Stalevo in the past which had to be changed to generic Sinemet and generic Comtan d/t cost. Previous workup had included EEG and brain MRI as well as MRA, mostly in 2015. I suggested we continue with the PD meds at the current doses. I suggested a 6 month routine follow-up. I answered all their questions today and her daughter were in agreement.I spent 25 minutes in total face-to-face time with the patient, more than 50% of which was spent in counseling and coordination of care, reviewing test results, reviewing medication and discussing or reviewing the diagnosis of PD, its prognosis and treatment options. Pertinent laboratory and imaging test results that were available during this visit with the patient were reviewed by me and considered in my medical decision making (see chart for details).

## 2017-09-30 ENCOUNTER — Ambulatory Visit: Payer: Medicare Other | Admitting: Neurology

## 2017-10-16 ENCOUNTER — Ambulatory Visit: Payer: Medicare Other

## 2017-10-23 ENCOUNTER — Other Ambulatory Visit: Payer: Self-pay | Admitting: Neurology

## 2017-10-23 ENCOUNTER — Ambulatory Visit (INDEPENDENT_AMBULATORY_CARE_PROVIDER_SITE_OTHER): Payer: Medicare Other

## 2017-10-23 VITALS — BP 122/64 | HR 71 | Temp 98.0°F | Ht 64.0 in | Wt 200.0 lb

## 2017-10-23 DIAGNOSIS — Z23 Encounter for immunization: Secondary | ICD-10-CM | POA: Diagnosis not present

## 2017-10-23 DIAGNOSIS — Z0000 Encounter for general adult medical examination without abnormal findings: Secondary | ICD-10-CM

## 2017-10-23 DIAGNOSIS — G20 Parkinson's disease: Secondary | ICD-10-CM

## 2017-10-23 MED ORDER — ZOSTER VAC RECOMB ADJUVANTED 50 MCG/0.5ML IM SUSR: 0.5000 mL | Freq: Once | INTRAMUSCULAR | 1 refills | Status: AC

## 2017-10-23 NOTE — Patient Instructions (Addendum)
[name] , Thank you for taking time to come for your Medicare Wellness Visit. I appreciate your ongoing commitment to your health goals. Please review the following plan we discussed and let me know if I can assist you in the future.   Screening recommendations/referrals: Colonoscopy excluded, over age 82 Mammogram excluded, over age 70 Bone Density due, ordered Recommended yearly ophthalmology/optometry visit for glaucoma screening and checkup Recommended yearly dental visit for hygiene and checkup  Vaccinations: Influenza vaccine high dose given today Pneumococcal vaccine up to date, completed Tdap vaccine due, declined for now Shingles vaccine due  Advanced directives: In chart  Conditions/risks identified: none  Next appointment: [name], RN 10/25/2018 @ 9:30am   Preventive Care 65 Years and Older, Female Preventive care refers to lifestyle choices and visits with your health care provider that can promote health and wellness. What does preventive care include?  A yearly physical exam. This is also called an annual well check.  Dental exams once or twice a year.  Routine eye exams. Ask your health care provider how often you should have your eyes checked.  Personal lifestyle choices, including:  Daily care of your teeth and gums.  Regular physical activity.  Eating a healthy diet.  Avoiding tobacco and drug use.  Limiting alcohol use.  Practicing safe sex.  Taking low-dose aspirin every day.  Taking vitamin and mineral supplements as recommended by your health care provider. What happens during an annual well check? The services and screenings done by your health care provider during your annual well check will depend on your age, overall health, lifestyle risk factors, and family history of disease. Counseling  Your health care provider may ask you questions about your:  Alcohol use.  Tobacco use.  Drug use.  Emotional well-being.  Home and  relationship well-being.  Sexual activity.  Eating habits.  History of falls.  Memory and ability to understand (cognition).  Work and work Statistician.  Reproductive health. Screening  You may have the following tests or measurements:  Height, weight, and BMI.  Blood pressure.  Lipid and cholesterol levels. These may be checked every 5 years, or more frequently if you are over 36 years old.  Skin check.  Lung cancer screening. You may have this screening every year starting at age 110 if you have a 30-pack-year history of smoking and currently smoke or have quit within the past 15 years.  Fecal occult blood test (FOBT) of the stool. You may have this test every year starting at age 69.  Flexible sigmoidoscopy or colonoscopy. You may have a sigmoidoscopy every 5 years or a colonoscopy every 10 years starting at age 59.  Hepatitis C blood test.  Hepatitis B blood test.  Sexually transmitted disease (STD) testing.  Diabetes screening. This is done by checking your blood sugar (glucose) after you have not eaten for a while (fasting). You may have this done every 1-3 years.  Bone density scan. This is done to screen for osteoporosis. You may have this done starting at age 65.  Mammogram. This may be done every 1-2 years. Talk to your health care provider about how often you should have regular mammograms. Talk with your health care provider about your test results, treatment options, and if necessary, the need for more tests. Vaccines  Your health care provider may recommend certain vaccines, such as:  Influenza vaccine. This is recommended every year.  Tetanus, diphtheria, and acellular pertussis (Tdap, Td) vaccine. You may need a Td booster  every 10 years.  Zoster vaccine. You may need this after age 15.  Pneumococcal 13-valent conjugate (PCV13) vaccine. One dose is recommended after age 105.  Pneumococcal polysaccharide (PPSV23) vaccine. One dose is recommended after  age 44. Talk to your health care provider about which screenings and vaccines you need and how often you need them. This information is not intended to replace advice given to you by your health care provider. Make sure you discuss any questions you have with your health care provider. Document Released: 02/02/2015 Document Revised: 09/26/2015 Document Reviewed: 11/07/2014 Elsevier Interactive Patient Education  2017 [place] Prevention in the Home Falls can cause injuries. They can happen to people of all ages. There are many things you can do to make your home safe and to help prevent falls. What can I do on the outside of my home?  Regularly fix the edges of walkways and driveways and fix any cracks.  Remove anything that might make you trip as you walk through a door, such as a raised step or threshold.  Trim any bushes or trees on the path to your home.  Use bright outdoor lighting.  Clear any walking paths of anything that might make someone trip, such as rocks or tools.  Regularly check to see if handrails are loose or broken. Make sure that both sides of any steps have handrails.  Any raised decks and porches should have guardrails on the edges.  Have any leaves, snow, or ice cleared regularly.  Use sand or salt on walking paths during winter.  Clean up any spills in your garage right away. This includes oil or grease spills. What can I do in the bathroom?  Use night lights.  Install grab bars by the toilet and in the tub and shower. Do not use towel bars as grab bars.  Use non-skid mats or decals in the tub or shower.  If you need to sit down in the shower, use a plastic, non-slip stool.  Keep the floor dry. Clean up any water that spills on the floor as soon as it happens.  Remove soap buildup in the tub or shower regularly.  Attach bath mats securely with double-sided non-slip rug tape.  Do not have throw rugs and other things on the floor that can  make you trip. What can I do in the bedroom?  Use night lights.  Make sure that you have a light by your bed that is easy to reach.  Do not use any sheets or blankets that are too big for your bed. They should not hang down onto the floor.  Have a firm chair that has side arms. You can use this for support while you get dressed.  Do not have throw rugs and other things on the floor that can make you trip. What can I do in the kitchen?  Clean up any spills right away.  Avoid walking on wet floors.  Keep items that you use a lot in easy-to-reach places.  If you need to reach something above you, use a strong step stool that has a grab bar.  Keep electrical cords out of the way.  Do not use floor polish or wax that makes floors slippery. If you must use wax, use non-skid floor wax.  Do not have throw rugs and other things on the floor that can make you trip. What can I do with my stairs?  Do not leave any items on the stairs.  Make  sure that there are handrails on both sides of the stairs and use them. Fix handrails that are broken or loose. Make sure that handrails are as long as the stairways.  Check any carpeting to make sure that it is firmly attached to the stairs. Fix any carpet that is loose or worn.  Avoid having throw rugs at the top or bottom of the stairs. If you do have throw rugs, attach them to the floor with carpet tape.  Make sure that you have a light switch at the top of the stairs and the bottom of the stairs. If you do not have them, ask someone to add them for you. What else can I do to help prevent falls?  Wear shoes that:  Do not have high heels.  Have rubber bottoms.  Are comfortable and fit you well.  Are closed at the toe. Do not wear sandals.  If you use a stepladder:  Make sure that it is fully opened. Do not climb a closed stepladder.  Make sure that both sides of the stepladder are locked into place.  Ask someone to hold it for you,  if possible.  Clearly mark and make sure that you can see:  Any grab bars or handrails.  First and last steps.  Where the edge of each step is.  Use tools that help you move around (mobility aids) if they are needed. These include:  Canes.  Walkers.  Scooters.  Crutches.  Turn on the lights when you go into a dark area. Replace any light bulbs as soon as they burn out.  Set up your furniture so you have a clear path. Avoid moving your furniture around.  If any of your floors are uneven, fix them.  If there are any pets around you, be aware of where they are.  Review your medicines with your doctor. Some medicines can make you feel dizzy. This can increase your chance of falling. Ask your doctor what other things that you can do to help prevent falls. This information is not intended to replace advice given to you by your health care provider. Make sure you discuss any questions you have with your health care provider. Document Released: 11/02/2008 Document Revised: 06/14/2015 Document Reviewed: 02/10/2014 Elsevier Interactive Patient Education  2017 [employer].

## 2017-10-23 NOTE — Progress Notes (Signed)
Subjective:   Andrea Dorsey is a 84 y.o. female who presents for Medicare Annual (Subsequent) preventive examination   Last AWV-02/18/2016    Objective:     Vitals: BP 122/64 (BP Location: Left Arm, Patient Position: Sitting)   Pulse 71   Temp 98 F (36.7 C) (Oral)   Ht 5\' 4"  (1.626 m)   Wt 200 lb (90.7 kg)   SpO2 96%   BMI 34.33 kg/m   Body mass index is 34.33 kg/m.  Advanced Directives 10/23/2017 07/16/2017 02/23/2017 02/23/2017 09/30/2016 06/19/2016 02/18/2016  Does Patient Have a Medical Advance Directive? Yes Yes - Yes Yes Yes Yes  Type of [place] administrator Power of [place] of [place]  Does patient want to make changes to medical advance directive? No - Patient declined No - Patient declined No - Patient declined - - - No - Patient declined  Copy of [place] in Chart? Yes Yes Yes Yes - Yes Yes  Would patient like information on creating a medical advance directive? - - - - - - -  Pre-existing out of facility DNR order (yellow form or pink MOST form) - - - - - - -    Tobacco Social History   Tobacco Use  Smoking Status Never Smoker  Smokeless Tobacco Never Used     Counseling given: Not Answered   Clinical Intake:  Pre-visit preparation completed: No  Pain : No/denies pain     Diabetes: No  How often do you need to have someone help you when you read instructions, pamphlets, or other written materials from your doctor or pharmacy?: 3 - Sometimes What is the last grade level you completed in school?: High School  Interpreter Needed?: No  Information entered by :: Andrea Dense, RN  Past Medical History:  Diagnosis Date  . Benign essential hypertension   . 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 Depression   . History of necrotizing fasciitis    left leg, s/p  debridement and graft  . Hypothyroidism   . Osteoarthritis, generalized   . Parkinson disease ([place])   . Spinal stenosis    Past Surgical History:  Procedure Laterality Date  . ABDOMINAL HYSTERECTOMY  1977  . SKIN DEBRIDEMENT  2014   Brambhelt, MD  . SKIN GRAFT  2014   Brambhelt MD  . SPINE SURGERY  2006   spinal stenosis   Family History  Problem Relation Age of Onset  . Heart disease Mother   . Heart disease Sister   . Hypertension Sister   . Stroke Sister   . Heart disease Sister        heart attack  . Cancer Sister        colon   Social History   Socioeconomic History  . Marital status: Widowed    Spouse name: Not on file  . Number of children: 2  . Years of education: 65  . Highest education level: Not on file  Occupational History    Comment: retired   Scientific laboratory technician  . Financial resource strain: Not hard at all  . Food insecurity:    Worry: Never true    Inability: Never true  . Transportation needs:    Medical: No    Non-medical: No  Tobacco Use  . Smoking status: Never Smoker  . Smokeless tobacco: Never Used  Substance  and Sexual Activity  . Alcohol use: No    Alcohol/week: 0.0 standard drinks  . Drug use: No  . Sexual activity: Not Currently  Lifestyle  . Physical activity:    Days per week: 0 days    Minutes per session: 0 min  . Stress: To some extent  Relationships  . Social connections:    Talks on phone: Three times a week    Gets together: Three times a week    Attends religious service: Never    Active member of club or organization: No    Attends meetings of clubs or organizations: Never    Relationship status: Widowed  Other Topics Concern  . Not on file  Social History Narrative   Patient lives in assisted living.patient is right handed    Outpatient Encounter Medications as of 10/23/2017  Medication Sig  . acetaminophen (TYLENOL) 500 MG tablet Take 500 mg by mouth every 6 (six) hours as needed for mild pain.   Marland Kitchen aspirin EC 81  MG tablet Take 81 mg by mouth daily.  . carbidopa-levodopa (PARCOPA) 25-100 MG disintegrating tablet Take 1 tablet by mouth 2 (two) times daily as needed.  . carbidopa-levodopa (SINEMET CR) 50-200 MG tablet Take 1 tablet by mouth at bedtime.  . carbidopa-levodopa (SINEMET IR) 25-100 MG tablet TAKE 1 AND 1/2 TABLETS BY  MOUTH AT 8AM AND 12PM 2  TABLETS AT 4PM AND 2  TABLETS AT 8PM  . Cholecalciferol (VITAMIN D3) 2000 UNITS TABS Take 2,000 Units by mouth daily.   . entacapone (COMTAN) 200 MG tablet Take 1 tablet (200 mg total) by mouth 4 (four) times daily.  Marland Kitchen escitalopram (LEXAPRO) 10 MG tablet Take 1 tablet (10 mg total) by mouth daily.  . hydrALAZINE (APRESOLINE) 25 MG tablet TAKE 1 TABLET BY MOUTH 3  TIMES DAILY  . levothyroxine (SYNTHROID, LEVOTHROID) 25 MCG tablet TAKE ONE TABLET BY MOUTH  ONCE DAILY 30 MINUTES  BEFORE BREAKFAST FOR  THYROID  . metoprolol succinate (TOPROL-XL) 25 MG 24 hr tablet TAKE 2 TABLETS BY MOUTH TWO TIMES DAILY   No facility-administered encounter medications on file as of 10/23/2017.     Activities of Daily Living In your present state of health, do you have any difficulty performing the following activities: 10/23/2017  Hearing? N  Vision? N  Difficulty concentrating or making decisions? Y  Walking or climbing stairs? Y  Dressing or bathing? Y  Doing errands, shopping? Y  Preparing Food and eating ? Y  Using the Toilet? Y  In the past six months, have you accidently leaked urine? Y  Comment stress incontinence  Do you have problems with loss of bowel control? N  Managing your Medications? Y  Managing your Finances? Y  Housekeeping or managing your Housekeeping? Y  Some recent data might be hidden    Patient Care Team: Gayland Curry, DO as PCP - General (Geriatric Medicine) Star Age, MD as Attending Physician (Neurology)    Assessment:   This is a routine wellness examination for Andrea Dorsey.  Exercise Activities and Dietary  recommendations Current Exercise Habits: The patient does not participate in regular exercise at present, Exercise limited by: neurologic condition(s);orthopedic condition(s)  Goals   None     Fall Risk Fall Risk  10/23/2017 09/28/2017 07/16/2017 02/23/2017 10/23/2016  Falls in the past year? Yes Yes No No No  Number falls in past yr: 2 or more - - - -  Comment sliding to floor - - - -  Injury  with Fall? No - - - -   Is the patient's home free of loose throw rugs in walkways, pet beds, electrical cords, etc?   yes      Grab bars in the bathroom? yes      Handrails on the stairs?   yes      Adequate lighting?   yes   Depression Screen PHQ 2/9 Scores 10/23/2017 07/16/2017 02/23/2017 10/23/2016  PHQ - 2 Score 0 0 0 0     Cognitive Function completed within the last year MMSE - [place] Exam 09/28/2017 02/19/2016 02/18/2016 09/25/2015 06/28/2014  Orientation to time 1 2 2 2 3   Orientation to Place 1 1 2 2 2   Registration 3 3 3 3 3   Attention/ Calculation 2 0 0 2 1  Recall 0 2 3 0 0  Language- name 2 objects 2 2 2 2 2   Language- repeat 0 1 1 1 1   Language- follow 3 step command 3 3 3 3 2   Language- read & follow direction 1 1 1 1 1   Write a sentence 0 1 0 1 0  Copy design 0 0 0 0 0  Total score 13 16 17 17 15         Immunization History  Administered Date(s) Administered  . Influenza, High Dose Seasonal PF 10/23/2016  . Influenza,inj,Quad PF,6+ Mos 10/07/2012, 12/18/2014, 10/15/2015  . Influenza-Unspecified 11/05/2010, 10/15/2011  . PPD Test 10/11/2012  . Pneumococcal Conjugate-13 12/18/2014  . Pneumococcal Polysaccharide-23 10/07/2012    Qualifies for Shingles Vaccine? Yes, educated and ordered to pharmacy  Screening Tests Health Maintenance  Topic Date Due  . Samul Dada  02/28/1948  . DEXA SCAN  02/29/1996  . INFLUENZA VACCINE  08/20/2017  . PNA vac Low Risk Adult  Completed    Cancer Screenings: Lung: Low Dose CT Chest recommended if Age 71-80 years, 30  pack-year currently smoking OR have quit w/in 15years. Patient does not qualify. Breast:  Up to date on Mammogram? Yes   Up to date of Bone Density/Dexa? No, ordered Colorectal: up to date  Additional Screenings:  Hepatitis C Screening: declined Flu vaccine due: high dose given     Plan:    I have personally reviewed and addressed the Medicare Annual Wellness questionnaire and have noted the following in the patient's chart:  A. Medical and social history B. Use of alcohol, tobacco or illicit drugs  C. Current medications and supplements D. Functional ability and status E.  Nutritional status F.  Physical activity G. Advance directives H. List of other physicians I.  Hospitalizations, surgeries, and ER visits in previous 12 months J.  [place] to include hearing, vision, cognitive, depression L. Referrals and appointments - none  In addition, I have reviewed and discussed with patient certain preventive protocols, quality metrics, and best practice recommendations. A written personalized care plan for preventive services as well as general preventive health recommendations were provided to patient.  See attached scanned questionnaire for additional information.   Signed,   Andrea Dense, RN Nurse Health Advisor  Patient Concerns: Per daughter patient has been having cloudy, red tinged urine for the last couple weeks. Sometimes the patient will say it burns while she's urinating but she isn't sure it that's from the urine or the pressure sore

## 2017-11-10 ENCOUNTER — Telehealth: Payer: Self-pay | Admitting: Neurology

## 2017-11-10 NOTE — Telephone Encounter (Signed)
Spoke with Dr. Jaynee Eagles who is aware of carbidopa >200 mg and verifies ok to continue medication as previously prescribed by [name].

## 2017-11-10 NOTE — Telephone Encounter (Signed)
Called [facility] and spoke with representative. Gave verbal order from Dr. Jaynee Eagles to fill the Sinemet IR 25-100 & Sinemet CR 50-200 as previously prescribed.

## 2017-11-10 NOTE — Telephone Encounter (Signed)
Pt daughter(on DPR-Davis,Wanda) has called re: pt's  carbidopa-levodopa (SINEMET IR) 25-100 MG tablet   She said that Dr Rexene Alberts needs to call   [facility] Mail order line @ [phone] and ref order# [id number] to give clarification about pt's medication.

## 2017-11-10 NOTE — Telephone Encounter (Signed)
Spoke with Juliann Pulse, pharmacist @ [facility]. She stated that total amount of Carbidopa that is order exceeds the recommended daily dose of 200 mg. She needs physician to verify that pt should continue as prescribed the Carbidopa-Levodopa 25-100 qid dosing as well as the Carbidopa-Levodopa 50-200 at bedtime. She is aware that [name] is out of the office but that RN will d/w work-in MD and return her call. Juliann Pulse verbalized appreciation.

## 2017-11-19 ENCOUNTER — Telehealth: Payer: Self-pay

## 2017-11-19 NOTE — Telephone Encounter (Signed)
Patient called c/o abnormal bowels  1. Is this the first time you had this concern? Pt has had several episodes over the last 2 weeks.   2. Any abdominal pain? Yes, lower abdominal pain  3. Are you experiencing any dizziness or weakness? no  4. If blood is present, how much? How often? How long since you first noticed? no  5. Have you eaten anything red? no  6. Have you taken any Peptio Bismol? no  I will forward this information to your provider and call with instructions, if your symptoms persist or progress, seek medical attention at you nearest urgent care or emergency room. Patient verbalized understanding.   Patient's daughter, [name], stated that patient will have 2 to 3 days of multiple episodes of diarrhea of the last 2 weeks. Then patient will not have a bowel movement for several days. Per [name], patient has not had a bowel movement since last Friday. She would like to know if provider has any recommendations for treatment?

## 2017-11-19 NOTE — Telephone Encounter (Signed)
I recommend a dose of miralax now and repeat in 2 hours if no bm.

## 2017-11-20 NOTE — Telephone Encounter (Signed)
Spoke with Andrea Dorsey and advised results

## 2017-12-04 DIAGNOSIS — L84 Corns and callosities: Secondary | ICD-10-CM | POA: Diagnosis not present

## 2017-12-04 DIAGNOSIS — I739 Peripheral vascular disease, unspecified: Secondary | ICD-10-CM | POA: Diagnosis not present

## 2017-12-04 DIAGNOSIS — L603 Nail dystrophy: Secondary | ICD-10-CM | POA: Diagnosis not present

## 2017-12-11 ENCOUNTER — Other Ambulatory Visit: Payer: Self-pay | Admitting: Internal Medicine

## 2017-12-11 ENCOUNTER — Other Ambulatory Visit: Payer: Self-pay | Admitting: Neurology

## 2017-12-11 DIAGNOSIS — G3183 Dementia with Lewy bodies: Secondary | ICD-10-CM

## 2017-12-11 DIAGNOSIS — R29898 Other symptoms and signs involving the musculoskeletal system: Secondary | ICD-10-CM

## 2017-12-11 DIAGNOSIS — F0280 Dementia in other diseases classified elsewhere without behavioral disturbance: Secondary | ICD-10-CM

## 2017-12-11 DIAGNOSIS — F419 Anxiety disorder, unspecified: Secondary | ICD-10-CM

## 2017-12-11 DIAGNOSIS — G20 Parkinson's disease: Secondary | ICD-10-CM

## 2018-02-06 ENCOUNTER — Other Ambulatory Visit: Payer: Self-pay | Admitting: Neurology

## 2018-02-06 ENCOUNTER — Other Ambulatory Visit: Payer: Self-pay | Admitting: Internal Medicine

## 2018-02-06 DIAGNOSIS — F0280 Dementia in other diseases classified elsewhere without behavioral disturbance: Secondary | ICD-10-CM

## 2018-02-06 DIAGNOSIS — R29898 Other symptoms and signs involving the musculoskeletal system: Secondary | ICD-10-CM

## 2018-02-06 DIAGNOSIS — G20 Parkinson's disease: Secondary | ICD-10-CM

## 2018-02-06 DIAGNOSIS — G3183 Dementia with Lewy bodies: Secondary | ICD-10-CM

## 2018-02-06 DIAGNOSIS — F419 Anxiety disorder, unspecified: Secondary | ICD-10-CM

## 2018-03-08 ENCOUNTER — Ambulatory Visit: Payer: Medicare Other | Admitting: Internal Medicine

## 2018-03-10 ENCOUNTER — Ambulatory Visit: Payer: Medicare Other | Admitting: Internal Medicine

## 2018-03-12 DIAGNOSIS — I739 Peripheral vascular disease, unspecified: Secondary | ICD-10-CM | POA: Diagnosis not present

## 2018-03-12 DIAGNOSIS — L84 Corns and callosities: Secondary | ICD-10-CM | POA: Diagnosis not present

## 2018-03-12 DIAGNOSIS — L603 Nail dystrophy: Secondary | ICD-10-CM | POA: Diagnosis not present

## 2018-03-18 ENCOUNTER — Ambulatory Visit: Payer: Medicare Other | Admitting: Neurology

## 2018-03-18 ENCOUNTER — Encounter: Payer: Self-pay | Admitting: Neurology

## 2018-03-18 ENCOUNTER — Telehealth: Payer: Self-pay

## 2018-03-18 VITALS — BP 140/88 | HR 66 | Ht 65.0 in | Wt 198.0 lb

## 2018-03-18 DIAGNOSIS — G20 Parkinson's disease: Secondary | ICD-10-CM

## 2018-03-18 DIAGNOSIS — G3183 Dementia with Lewy bodies: Secondary | ICD-10-CM

## 2018-03-18 DIAGNOSIS — R29898 Other symptoms and signs involving the musculoskeletal system: Secondary | ICD-10-CM | POA: Diagnosis not present

## 2018-03-18 DIAGNOSIS — F419 Anxiety disorder, unspecified: Secondary | ICD-10-CM | POA: Diagnosis not present

## 2018-03-18 DIAGNOSIS — F0280 Dementia in other diseases classified elsewhere without behavioral disturbance: Secondary | ICD-10-CM

## 2018-03-18 MED ORDER — CARBIDOPA-LEVODOPA 25-100 MG PO TABS: 2.0000 | ORAL_TABLET | Freq: Four times a day (QID) | ORAL | 3 refills | Status: AC

## 2018-03-18 MED ORDER — CARBIDOPA-LEVODOPA 25-100 MG PO TBDP: 1.0000 | ORAL_TABLET | Freq: Two times a day (BID) | ORAL | 3 refills | Status: AC | PRN

## 2018-03-18 NOTE — Progress Notes (Signed)
Subjective:    Patient ID: Andrea Dorsey is a 85 y.o. female.  HPI     Interim history:   [name] is an 85 year old right-handed woman with an underlying complex medical history of spinal stenosis, necrotizing fasciitis, status post debridement and grafting on the right lower leg, lower extremity edema, insomnia, hypothyroidism, osteoarthritis, hypertension, depression, and obesity, who presents for followup consultation of her advanced, right-sided predominant Parkinson's disease, complicated by hallucinations, memory loss, mood disorder including anxiety and depression, sleep disorder (insomnia, OSA and RBD), lethargy, and complications pertaining to deconditioning and advancing age in general. She is accompanied by her daughter again today and presents for slightly sooner than scheduled appointment. I last saw her on 09/28/2017, at which time she had more difficulty with overall mobility. Her daughter had noted more freezing episodes, she had fairly stable intermittent visual hallucinations. She did have some episodes of dream enactments. She still had occasional spells of decreased responsiveness with stable vital signs typically. Her MMSE was 13 at the time. I suggested we continue with Sinemet, Sinemet CR as well as Comtan.  Today, 03/18/2018: (all dictated new, as well as above notes, some dictation done in note pad or Word, outside of chart, may appear as copied):  She is unable to provide her history. She is minimally verbal. Her daughter reports that her episodes of decreased responsiveness seemed to have become more frequent. She has not called EMS are taken her to the emergency room. During these episodes the patient is unable to follow commands, no obvious shaking is noted. She had an EEG in 2015 which was normal in the awake state. Daughter is reluctant to proceed with another EEG. Daughter has had a difficult time caring for her mother in the home setting. She does have caretakers  as well. She is looking into long-term care facilities, she has visited a few places and visited 1 location with her mom. She is looking at assisted living. They have been utilizing the [facility] as needed, up to 2 a day, about 3 days out of the week. Appetite is a little less. She tries to encourage the patient to drink water. She typically has some lemonade and ginger ale with meals and water in between. Daughter reports that patient has developed a small pressure sore. They are using over-the-counter medication and a special pillow.  The patient's allergies, current medications, family history, past medical history, past social history, past surgical history and problem list were reviewed and updated as  appropriate.    Previously (copied from previous notes for reference):   I saw her on 01/02/2017, at which time she had some lower back pain and some issues with decubitus sores. They were healing but recurring. She had reasonably good appetite. She had sleep difficulty at night and was napping throughout the day. She had cognitive decline over time. She was requiring assistance with all her ADLs. I suggested that we retry clonazepam in low-dose but she either did not try it or stopped it after trying it.    I saw her on 05/01/2016 for a sooner than scheduled appointment because of an episode of altered mental status. Her daughter called on 04/19/2016 reporting that patient was less responsive for about an hour. We had advised her daughter to take patient to the emergency room. I ordered an EEG. She did not have the EEG.   The daughter called in the interim on 04/19/2016 due to patient being less responsive for an hour. She was advised  to take patient to the emergency room. She did not do this. She called on 04/22/2016 to request a sooner appointment. I last saw her on 02/19/2016, at which time she reported feeling weaker in her legs. She was having trouble standing up. She felt that mornings are worse  in evening's. Daughter reported that gabapentin did not help very much at night. She did not have any recent falls. Daughter requested a lift chair. She had constipation, which they tried to manage with prune juice and as needed laxative. She did go 5 days without a bowel movement recently. Patient was still having issues with anxiety off and on, no significant depression and stable on Lexapro.   I saw her on 09/25/15, at which time she reported doing okay. Her sleep was a little better per daughter. Her daughter had to reduce her work hours to 30 hours per week to be able to take better care of her mom. Patient was taking Sinemet 2 pills alternating with 1-1/2 pills. She was taking Parcopa as needed. She was complaining of her legs feeling heavy. She was on Lexapro generic 10 mg daily with success. She was taking gabapentin 200 mg at night. She had not tried BuSpar as yet. Her memory was a little worse per daughter. Patient needed more verbal cues and guidance. She did not have significant constipation. She had 24-7 supervision. She had no recent falls thankfully.   I saw her on 05/08/2015, at which time she reported still having trouble sleeping at night, her legs would feel heavy, she had trouble moving altogether. We have changed her Stalevo to generic Sinemet and entacapone secondary to cost. She was taking Sinemet 1-1/2 pills 4 times a day and the entacapone 200 mg strength one pill 4 times a day. She had not tried the BuSpar. She had residual anxiety. She was on Lexapro. She has caretakers during the day but daughter felt overwhelmed and has to help out a lot over the weekend 2 and works full-time as well. Daughter had noticed more freezing spells in the afternoons. I suggested she start a trial of low-dose BuSpar. We also increased her Sinemet to alternate 1-1/2 pills with 2 pills, we kept the entacapone the same.   I saw her on 11/15/2014 at which time she had more numbness in her legs. She has more  difficulty with bladder control. She had thankfully not fallen and anxiety was stable. She was using her walker. I suggested we continue with Stalevo 150 mg 4 times a day. She had problems with her doses and other medication intolerances in the past.   I saw her on 06/28/2014, at which time her daughter reported that things were fairly stable. Patient was reporting a cough at night. She was taking cough medicine daily. She reported postnasal drip and drooling at night. Her speech was softer. She was walking with a walker and thankfully had not fallen recently. Her appetite was good. She needed more assistance. She had a lift chair. She was on Stalevo 1 pill 4 times a day at 4 hourly intervals. She was on Sinemet CR at night. They never tried the BuSpar as the anxiety episodes or spells subsided. Her daughter suspected that these spells were in the context of a certain caretaker that cause stress to the patient. She was on Lexapro 10 mg daily. Her daughter requested FMLA paperwork so she could make it to the appointments and also be able to go home in an emergency situation.   I  saw her on 03/16/2014, at which time her daughter reported ongoing issues with spells of decreased alertness. She would not lose full consciousness but became listless and limp. She would not follow commands. The duration would be minutes to maybe 2 hours. Checking blood pressure at this times showed that she had some high blood pressure at the time. There was no convulsion, no tongue bite, no full loss of consciousness, no loss of bowel or bladder control except for one time when she did wet herself. There was no time predilection. I suggested we continue with Lexapro at 10 mg and add low-dose BuSpar for anxiety. We have done workup in the form of brain scans and EEG in the recent past. She was no longer on Parcopa as it did not help. She was not sleeping well. We kept the Sinemet CR the same at night.   I saw her on 12/07/2013, at  which time I talked to the patient's daughter, [name], separately before seeing the patient. Her daughter was concerned about the patient's general decline. She had more dream enactments. The patient felt that she was getting worse and that she could not sleep at night. She was dozing off during the day. She had not fallen thankfully. She could not tolerate clonazepam. She also had side effects on low-dose Seroquel. Melatonin at 10 mg at night did not seem to help. She was not drinking enough water. She had moved to a new home and had 3 caretakers taking turns and she was not without supervision. On the weekends her family will take turns supervising and helping. I increased her Lexapro and kept her other medications the same.    I saw her on 09/07/13, at which time her daughter reported no recent staring or zoning out spells. She was in the process of moving to a townhome with a caretaker for 4 days and 3 nights and family staying with her the rest of the time. I ordered an EEG. I also asked her to take Sinemet CR at night to get her through the night a little bit better. I started her on a low-dose Lexapro 5 mg strength. Her EEG on 09/22/2013 was reported as normal in the awake state. Her daughter requested a sooner appointment for more hallucinations and confusion. She had more memory loss.    I saw her on 06/08/2013, at which time I suggested she continue with Stalevo 150 4 times a day. She previously has had hallucinations with increased doses. I suggested adding a little extra levodopa in the form of Parcopa half a pill up to twice daily as needed. This was supposed to help with freezing. In the interim, about a month later on 07/13/2013 she was seen by Charlott Holler, NP for a sooner than scheduled appointment at which time the Parcopa dose was increased to one whole pill up to twice daily for rigidity and freezing. She presented to the emergency room on 07/15/2013 with generalized complaint of weakness and  freezing and was advised to increase her Parcopa. She presented voice recently this month to the emergency room with altered sensorium, or staring spells. Workup with head CT and MRIs were negative. It was felt that these were related to Parkinson's disease. She had a brain MRI and MRA without contrast on 09/03/2013:No acute intracranial abnormality. 2. Cerebral atrophy and single remote microhemorrhage in the left frontal lobe. 3. Unremarkable head MRA. In addition, have reviewed the images through the PACS system. She had head CT without contrast on  09/03/2013:No acute intracranial pathology seen on CT. 2. Inspissated mucus filling the left maxillary sinus, and mild partial opacification of the mastoid air cells bilaterally. She had head CT without contrast on 09/01/2013: No acute intracranial pathology seen on CT. 2. Mild cortical volume loss noted. 3. Mild partial opacification of the mastoid air cells bilaterally. In addition, reviewed the images through the PACS system.   I saw her on 02/03/13, at which time I increased her Stalevo to 5 times a day. We talked about potential side effects. I asked her to stop the Seroquel and started her on low-dose clonazepam for insomnia and RBD. In the interim she was seen by our nurse practitioner, Ms. Lam on 04/08/2013, at which time I also saw her, and we mutually decided to decrease Stalevo back to 4 times a day because of worsening confusion and hallucinations. Her clonazepam was discontinued by her PCP because of side effects. I suggested a trial of melatonin, 5-10 mg. We checked some labs including CBC, CMP, CRP, ESR and urinalysis. Labs showed no significant abnormalities, mild but stable kidney impairment was noted. She was encouraged to drink more water. She has seen her PCP in April 2015 and was advised to take her fluid pill as needed, an increase of melatonin to 10 mg.   I first met her on 10/15/2012, at which time a continued her Stalevo. I suggested a small  dose of Seroquel to help her sleep and tone down the REM behavior disorder and encouraged him to discuss with her primary care physician the addition of an antidepressant. She presents with a complaint of worsening tremors.   She has been in ALF, [facility] [address] since 10/18/12. She has a Hx of vivid dreams and tends to act out in her sleep.   She previously used to see a neurologist at [facility], when she lived in [place], [place]. She was diagnosed with PD about 12 years ago when she was still residing in [place]. She needs assistance with her ADLs. She has been living with her daughter and son-in-law and they have looked into the possibility of a long-term care facility but the patient has been resistant. She has had problems at night including sundowning, confusion, inability to sleep.   Her symptoms started on one side with tremors, but the patient was not sure which side. She has been on Stalevo for the past 2 years, and prior to that she was on C/L, and prior to that she was on Amantadine. She may not have tried a dopamine agonist or rasagiline in the past. She has been experiencing nausea with her PD medications and still has occasional nausea. In March 2014 she developed necrotizing fasciitis and needed debridement and grafting. She developed hallucinations at the time, but was on pain medications at the time, but the [facility] persisted beyond that. She also started having memory loss then. She developed cellulitis with complications in her jaw and needed all remaining teeth removed and had IV antibiotics in mid-2014. She has no FHx of PD or dementia. She has no Hx of psychiatric premorbid illness. In 2006 she had back surgery. She has no exposure to chemicals, or agent orange. She has been an anxious person. She is not able to sleep at night. She was tried on Ambien and amitriptyline. She has difficulty with sleep onset and sleep maintenance.   She has occasional urinary incontinence,  occasional constipation. She snores, and needed to have a sleep study, but did  not go. She has had some dream enactments and has slid out of bed.   She had been very opposed to going into assisted living, but understood that she given her complex medical history and multiple issues and advanced Parkinson's disease she was no longer safe to live by herself.  Her Past Medical History Is Significant For: Past Medical History:  Diagnosis Date  . Benign essential hypertension   . Dementia in Parkinson's disease ([place])   . Depression   . History of necrotizing fasciitis    left leg, s/p debridement and graft  . Hypothyroidism   . Osteoarthritis, generalized   . Parkinson disease ([place])   . Spinal stenosis     Her Past Surgical History Is Significant For: Past Surgical History:  Procedure Laterality Date  . ABDOMINAL HYSTERECTOMY  1977  . SKIN DEBRIDEMENT  2014   [place], MD  . SKIN GRAFT  2014   [place] MD  . SPINE SURGERY  2006   spinal stenosis    Her Family History Is Significant For: Family History  Problem Relation Age of Onset  . Heart disease Mother   . Heart disease Sister   . Hypertension Sister   . Stroke Sister   . Heart disease Sister        heart attack  . Cancer Sister        colon    Her Social History Is Significant For: Social History   Socioeconomic History  . Marital status: Widowed    Spouse name: Not on file  . Number of children: 2  . Years of education: 15  . Highest education level: Not on file  Occupational History    Comment: retired   Scientific laboratory technician  . Financial resource strain: Not hard at all  . Food insecurity:    Worry: Never true    Inability: Never true  . Transportation needs:    Medical: No    Non-medical: No  Tobacco Use  . Smoking status: Never Smoker  . Smokeless tobacco: Never Used  Substance and Sexual Activity  . Alcohol use: No    Alcohol/week: 0.0 standard drinks  . Drug use: No  . Sexual activity: Not Currently   Lifestyle  . Physical activity:    Days per week: 0 days    Minutes per session: 0 min  . Stress: To some extent  Relationships  . Social connections:    Talks on phone: Three times a week    Gets together: Three times a week    Attends religious service: Never    Active member of club or organization: No    Attends meetings of clubs or organizations: Never    Relationship status: Widowed  Other Topics Concern  . Not on file  Social History Narrative   Patient lives in assisted living.patient is right handed    Her Allergies Are:  Allergies  Allergen Reactions  . Piperacillin-Tazobactam In Dex Rash    Unclear whether associated with clindamycin or zosyn, but probably more likely to be zosyn.   [name] Dye [Iodinated Diagnostic Agents]     Only when intravenous, not on external skin.  . Clindamycin Rash    Unclear whether patient has an actual allergy to clindamycin. On beta-lactam at the same time.  :   Her Current Medications Are:  Outpatient Encounter Medications as of 03/18/2018  Medication Sig  . acetaminophen (TYLENOL) 500 MG tablet Take 500 mg by mouth every 6 (six) hours as needed for  mild pain.   Marland Kitchen aspirin EC 81 MG tablet Take 81 mg by mouth daily.  . carbidopa-levodopa (PARCOPA) 25-100 MG disintegrating tablet Take 1 tablet by mouth 2 (two) times daily as needed.  . carbidopa-levodopa (SINEMET CR) 50-200 MG tablet TAKE 1 TABLET BY MOUTH AT  BEDTIME  . carbidopa-levodopa (SINEMET IR) 25-100 MG tablet Take 2 tablets by mouth 4 (four) times daily.  . Cholecalciferol (VITAMIN D3) 2000 UNITS TABS Take 2,000 Units by mouth daily.   . entacapone (COMTAN) 200 MG tablet TAKE 1 TABLET BY MOUTH 4  TIMES DAILY  . escitalopram (LEXAPRO) 10 MG tablet TAKE 1 TABLET BY MOUTH  DAILY  . hydrALAZINE (APRESOLINE) 25 MG tablet TAKE 1 TABLET BY MOUTH 3  TIMES DAILY  . levothyroxine (SYNTHROID, LEVOTHROID) 25 MCG tablet TAKE ONE TABLET BY MOUTH  ONCE DAILY 30 MINUTES  BEFORE BREAKFAST FOR   THYROID  . metoprolol succinate (TOPROL-XL) 25 MG 24 hr tablet TAKE 2 TABLETS BY MOUTH TWO TIMES DAILY  . [DISCONTINUED] carbidopa-levodopa (PARCOPA) 25-100 MG disintegrating tablet Take 1 tablet by mouth 2 (two) times daily as needed.  . [DISCONTINUED] carbidopa-levodopa (SINEMET IR) 25-100 MG tablet TAKE 1 AND 1/2 TABLETS BY  MOUTH AT 8AM AND 12PM 2  TABLETS AT 4PM AND 2  TABLETS AT 8PM   No facility-administered encounter medications on file as of 03/18/2018.   :  Review of Systems:  Out of a complete 14 point review of systems, all are reviewed and negative with the exception of these symptoms as listed below: Review of Systems  Neurological:       Pt presents today to discuss her spells of unresponsiveness. Pt's daughter reports that they are increasing in frequency. Pt's usage of parcopa also seems to be increasing.    Objective:  Neurological Exam  Physical Exam Physical Examination:   Vitals:   03/18/18 1138 03/18/18 1213  BP: (!) 207/96 140/88  Pulse: 66    General Examination: The patient is a very pleasant 85 y.o. female in no acute distress. She is frail and deconditioned. She is stooped over in the chair. She has a pillow on the back for support. Of note, the daughter also reports that patient has a small pressure sore. Repeat blood pressure towards the end of the visit was 140/88.  HEENT:Normocephalic, atraumatic, pupils are equal, round and reactive to light and accommodation. She has difficulty tracking particularly in the vertical gaze. She has limitation to upper gaze. She has hearing impairment. She has moderate facial masking, she has a lower lip tremor. Neck is moderately rigid. She has edentulous speech and moderately to severely hypophonic today, moderate dysarthria noted. Mildmouth dryness noted. Mallampati is class III. Tongue is central.  Chest:Clear to auscultation without wheezing, rhonchi or crackles noted.  Heart:S1+S2+0, regular and normal without  murmurs, rubs or gallops noted.   Abdomen:Soft, non-tender and non-distended with normal bowel sounds appreciated on auscultation.  Extremities:There istraceedema in the distal lower extremities bilaterally. Chronic changes after skin grafting lower ext.  Skin: Warm and dry without trophic changes noted.   Musculoskeletal: exam reveals no obvious joint deformities, tenderness or joint swelling or erythema.  Neurologically: Mental status: The patient is awake,not very alert, does not pay very close attention. She answers with one or 2 word sentences, is minimally verbal overall, his hard to understand today. Memory is impaired.  On 12/07/2013: MMSE 17/30, CDT: 3/4, AFT: 5/min  On 06/28/2014: MMSE: 15/30, CDT: 2/4, AFT: 11/min.   On 09/25/2015: MMSE:  17/30, CDT: 1/4, AFT: 7/min.  On 02/19/2016: MMSE: 16/30, CDT: 2/4, AFT: 6/min.  On12/14/2018: MMSE:15/30, CDT: 1/4, AFT: 3/min.  On 09/28/2017: MMSE: 13/30, CDT: 1/4, AFT: 4/min.  On 03/18/2018: MMSE: 12/30, CDT: 0/4, AFT: 0/min.   Cranial nerves are as described above under HEENT exam. In addition, she has a mildly weak shoulder shrug bilaterally.   Motor exam:Globallythin bulk, global strength of 4 out of 5, she has no dyskinesias, she has an intermittent resting tremor in the left upper extremity, no lower extremity tremors. Fine motor skills are moderateto severely impaired globally. She has moderate bradykinesia, Romberg is not testable. Reflexes are 1+ in the upper extremities and absent in the lower extremities.   Sensory exam isintact to light touch throughout.   Gait, station and balance: She stands with significant difficulty and needs assistance, she has a moderately stooped posture, stands wide-based,she uses her rolling walker. She isable to use her walker.Balance is impaired.  Assessmentand Plan:   In summary,Santasia Hoggardis a very pleasant 63 year oldfemalewith an underlyingcomplex  medical history of spinal stenosis, leg swelling, history of cellulitis of the jaw and also necrotizing fasciitis of the leg with status post skin grafting of the right leg, hypertension, depression, anxiety, osteoarthritis, and hypothyroidism as well as overweight state, who presents for follow-up consultation of her advanced right-sided predominant Parkinson's disease, complicated by chronic constipation, dementia, history of RBD, deconditioning, global weakness, anxiety, depression,lethargy, spells of decreased attentiveness and overall physical challenges pertaining to aging and progression of PD. She has caregivers and her daughter has been very attentive. She has been on Comtan and Sinemet. Her daughter has noticed more motor decline including freezing and trembling. They are advised to increase the Sinemet to 2 pills 4 times a day and maintain her on Comtan 1 pill 4 times a day and Sinemet CR at bedtime. She is no longer on clonazepam. She has had issues with anxiety and has been on Lexapro. She is no longer on gabapentin. She has had intermittent spells of unresponsivenes during which she was not noticed to have any blood pressure issues, no convulsion or twitching, can last a few hours even. No obvious triggers, no pattern. We have previously done an EEG in 2015 which was normal for age during the awake stateandIorderedanotherEEGin The more recent past but they decided not to go through with it. She is again offered an EEG. Her daughter will think about it. She has had ongoing difficulty with sleep at night. She has been tried on multiple medications in the past including Ambien, amitriptyline, amantadine, clonazepam, Seroquel at different times with intolerances to medicationsor no help, melatonin also did not help.She was briefly on clonazepam, this was stopped by PCP in early 2015. She was on Stalevo in the past which had to be changed to generic Sinemet and generic Comtand/t cost.Previous  workup had included EEG and brain MRI as well as MRA, in 2015. Her daughter is looking into transitioning her into an assisted living facility. I'm not sure if she would be a candidate for assisted living facility. I do understand that there are different levels of ALF. She may be more appropriate for memory care facility. I suggested a six-month follow-up, sooner if needed. The daughter will call back if she would like to pursue an EEG, I will order it at the time. I answered all their questions today and the patient and her daughter were in agreement. I spent 25 minutes in total face-to-face time with the patient, more than  50% of which was spent in counseling and coordination of care, reviewing test results, reviewing medication and discussing or reviewing the diagnosis of PD, its prognosis and treatment options. Pertinent laboratory and imaging test results that were available during this visit with the patient were reviewed by me and considered in my medical decision making (see chart for details).

## 2018-03-18 NOTE — Telephone Encounter (Signed)
Received a medical clarification request from [facility] regarding pt's c/l Rxs. I confirmed with Amy pharmacist that pt is taking parcopa 25-100mg  1 tablet BID PRN, sinemet IR 25-100 2 pills QID, and sinemet 50-200mg  CR QHS, all of which is exactly how [name] ordered it.

## 2018-03-18 NOTE — Patient Instructions (Signed)
I agree with your considering long term care options. I am not sure she would be a candidate for assisted living at this point, may be more in like with memory care.  We will increase the Sinemet to 2 pills 4 times a day and keep the Parcopa the same, comtan 4 times a day, and the Sinemet CR at bedtime.  We can consider another EEG.  We will monitor the memory.

## 2018-03-31 ENCOUNTER — Telehealth: Payer: Self-pay | Admitting: Neurology

## 2018-03-31 DIAGNOSIS — G3183 Dementia with Lewy bodies: Secondary | ICD-10-CM

## 2018-03-31 DIAGNOSIS — F0280 Dementia in other diseases classified elsewhere without behavioral disturbance: Secondary | ICD-10-CM

## 2018-03-31 DIAGNOSIS — G20 Parkinson's disease: Secondary | ICD-10-CM

## 2018-03-31 DIAGNOSIS — F419 Anxiety disorder, unspecified: Secondary | ICD-10-CM

## 2018-03-31 DIAGNOSIS — R29898 Other symptoms and signs involving the musculoskeletal system: Secondary | ICD-10-CM

## 2018-03-31 MED ORDER — ENTACAPONE 200 MG PO TABS: 200.0000 mg | ORAL_TABLET | Freq: Four times a day (QID) | ORAL | 0 refills | Status: DC

## 2018-03-31 NOTE — Telephone Encounter (Signed)
Pt's daughter Hurley Cisco said entacapone (COMTAN) 200 MG tablet is on back order with [facility]. She is wanting new script sent to [facility].

## 2018-03-31 NOTE — Telephone Encounter (Signed)
I called pt's daughter, [name], per DPR, and advised her that a bridge RX for comtan was sent to [facility] until pt's mail order pharmacy can receive more comtan. Pt's daughter verbalized understanding.

## 2018-04-01 ENCOUNTER — Ambulatory Visit: Payer: Medicare Other | Admitting: Internal Medicine

## 2018-04-01 ENCOUNTER — Ambulatory Visit: Payer: Medicare Other | Admitting: Neurology

## 2018-04-07 ENCOUNTER — Ambulatory Visit: Payer: Self-pay | Admitting: Internal Medicine

## 2018-04-08 ENCOUNTER — Ambulatory Visit (INDEPENDENT_AMBULATORY_CARE_PROVIDER_SITE_OTHER): Payer: Medicare Other | Admitting: Internal Medicine

## 2018-04-08 ENCOUNTER — Encounter: Payer: Self-pay | Admitting: Internal Medicine

## 2018-04-08 ENCOUNTER — Other Ambulatory Visit: Payer: Self-pay

## 2018-04-08 VITALS — BP 160/90 | HR 69 | Temp 98.2°F | Ht 65.0 in | Wt 194.0 lb

## 2018-04-08 DIAGNOSIS — I10 Essential (primary) hypertension: Secondary | ICD-10-CM

## 2018-04-08 DIAGNOSIS — L89322 Pressure ulcer of left buttock, stage 2: Secondary | ICD-10-CM

## 2018-04-08 DIAGNOSIS — D45 Polycythemia vera: Secondary | ICD-10-CM | POA: Diagnosis not present

## 2018-04-08 DIAGNOSIS — F0280 Dementia in other diseases classified elsewhere without behavioral disturbance: Secondary | ICD-10-CM

## 2018-04-08 DIAGNOSIS — I5032 Chronic diastolic (congestive) heart failure: Secondary | ICD-10-CM

## 2018-04-08 DIAGNOSIS — G20A1 Parkinson's disease without dyskinesia, without mention of fluctuations: Secondary | ICD-10-CM

## 2018-04-08 DIAGNOSIS — E039 Hypothyroidism, unspecified: Secondary | ICD-10-CM

## 2018-04-08 DIAGNOSIS — R739 Hyperglycemia, unspecified: Secondary | ICD-10-CM | POA: Diagnosis not present

## 2018-04-08 DIAGNOSIS — G20 Parkinson's disease: Secondary | ICD-10-CM

## 2018-04-08 NOTE — Progress Notes (Signed)
Location:  [facility] clinic Provider:  Alem Fahl L. [name], D.O., C.M.D.  Code Status: DNR Goals of Care:  Advanced Directives 10/23/2017  Does Patient Have a Medical Advance Directive? Yes  Type of Advance Directive [place]  Does patient want to make changes to medical advance directive? No - Patient declined  Copy of [place] in Chart? Yes  Would patient like information on creating a medical advance directive? -  Pre-existing out of facility DNR order (yellow form or pink MOST form) -     Chief Complaint  Patient presents with  . Medical Management of Chronic Issues    6th follow-up    HPI: Patient is a 85 y.o. female seen today for medical management of chronic diseases.    Had united health care visit.  One was shingles and other was tdap.    Her daughter brought an FL2 form b/c there need to be changes in her long-term care.  Her daughter is having carpal tunnel and arthritis in her hands limiting her ability to care.  Caregiver is no longer with them.  Pt went to [name] a few weeks ago and pt is haiving periods where she "goes out".  BP has been normal when it happens.  Carbidopa/levodopa was increased to 2 pills 4 times a day.  No big changes.    Sore spot on bottom is looking a lot better.  After each episode, wipes are used.  endit is used twice a day and another skin protectant in b/w.    She does eat her veggies, but has had a lot of cake and chocolate ice cream recently.  Past Medical History:  Diagnosis Date  . Benign essential hypertension   . Dementia in Parkinson's disease ([place])   . Depression   . History of necrotizing fasciitis    left leg, s/p debridement and graft  . Hypothyroidism   . Osteoarthritis, generalized   . Parkinson disease ([place])   . Spinal stenosis     Past Surgical History:  Procedure Laterality Date  . ABDOMINAL HYSTERECTOMY  1977  . SKIN DEBRIDEMENT  2014   Brambhelt, MD  . SKIN GRAFT  2014   [place]  2006   spinal stenosis    Allergies  Allergen Reactions  . Piperacillin-Tazobactam In Dex Rash    Unclear whether associated with clindamycin or zosyn, but probably more likely to be zosyn.   [name] Dye [Iodinated Diagnostic Agents]     Only when intravenous, not on external skin.  . Clindamycin Rash    Unclear whether patient has an actual allergy to clindamycin. On beta-lactam at the same time.    Outpatient Encounter Medications as of 04/08/2018  Medication Sig  . acetaminophen (TYLENOL) 500 MG tablet Take 500 mg by mouth every 6 (six) hours as needed for mild pain.   Marland Kitchen aspirin EC 81 MG tablet Take 81 mg by mouth daily.  . carbidopa-levodopa (PARCOPA) 25-100 MG disintegrating tablet Take 1 tablet by mouth 2 (two) times daily as needed.  . carbidopa-levodopa (SINEMET CR) 50-200 MG tablet TAKE 1 TABLET BY MOUTH AT  BEDTIME  . carbidopa-levodopa (SINEMET IR) 25-100 MG tablet Take 2 tablets by mouth 4 (four) times daily.  . Cholecalciferol (VITAMIN D3) 2000 UNITS TABS Take 2,000 Units by mouth daily.   . entacapone (COMTAN) 200 MG tablet TAKE 1 TABLET BY MOUTH 4  TIMES DAILY  . escitalopram (LEXAPRO) 10 MG tablet TAKE  1 TABLET BY MOUTH  DAILY  . hydrALAZINE (APRESOLINE) 25 MG tablet TAKE 1 TABLET BY MOUTH 3  TIMES DAILY  . levothyroxine (SYNTHROID, LEVOTHROID) 25 MCG tablet TAKE ONE TABLET BY MOUTH  ONCE DAILY 30 MINUTES  BEFORE BREAKFAST FOR  THYROID  . metoprolol succinate (TOPROL-XL) 25 MG 24 hr tablet TAKE 2 TABLETS BY MOUTH TWO TIMES DAILY  . [DISCONTINUED] entacapone (COMTAN) 200 MG tablet Take 1 tablet (200 mg total) by mouth 4 (four) times daily.   No facility-administered encounter medications on file as of 04/08/2018.     Review of Systems:  Review of Systems  Constitutional: Positive for malaise/fatigue. Negative for chills and fever.  HENT: Negative for congestion.   Eyes: Negative for blurred vision.  Respiratory: Negative for cough and  shortness of breath.   Cardiovascular: Negative for chest pain, palpitations and leg swelling.  Gastrointestinal: Positive for constipation. Negative for abdominal pain, blood in stool and melena.  Genitourinary: Negative for dysuria.  Musculoskeletal: Negative for falls and joint pain.  Skin: Negative for itching and rash.  Neurological: Negative for dizziness and loss of consciousness.  Endo/Heme/Allergies: Does not bruise/bleed easily.  Psychiatric/Behavioral: Positive for hallucinations and memory loss. Negative for depression. The patient is not nervous/anxious and does not have insomnia.     Health Maintenance  Topic Date Due  . TETANUS/TDAP  02/28/1950  . DEXA SCAN  02/29/1996  . INFLUENZA VACCINE  Completed  . PNA vac Low Risk Adult  Completed    Physical Exam: Vitals:   04/08/18 1457  BP: (!) 160/90  Pulse: 69  Temp: 98.2 F (36.8 C)  TempSrc: Oral  SpO2: 94%  Weight: 194 lb (88 kg)  Height: 5\' 5"  (1.651 m)   Body mass index is 32.28 kg/m. Physical Exam Vitals signs reviewed.  Constitutional:      Appearance: Normal appearance. She is obese. She is not ill-appearing or diaphoretic.  HENT:     Head: Normocephalic and atraumatic.  Cardiovascular:     Rate and Rhythm: Normal rate and regular rhythm.     Pulses: Normal pulses.  Pulmonary:     Effort: Pulmonary effort is normal.     Breath sounds: Normal breath sounds. No rales.  Abdominal:     General: There is no distension.     Palpations: Abdomen is soft. There is no mass.     Tenderness: There is no abdominal tenderness. There is no guarding or rebound.     Comments: Hyperactive bowel sounds  Musculoskeletal: Normal range of motion.     Comments: Stooped posture, shuffling gait, uses rollator, daughter must pull her up out of her chair  Skin:    General: Skin is warm and dry.     Capillary Refill: Capillary refill takes less than 2 seconds.  Neurological:     General: No focal deficit present.      Mental Status: She is alert.     Gait: Gait abnormal.  Psychiatric:     Comments: Chronic masked facies     Labs reviewed: Basic Metabolic Panel: Recent Labs    07/16/17 1435  NA 142  K 4.2  CL 108  CO2 28  GLUCOSE 109  BUN 24  CREATININE 1.39*  CALCIUM 9.7  TSH 3.52   Liver Function Tests: Recent Labs    07/16/17 1435  AST 11  ALT 3*  BILITOT 1.3*  PROT 6.2   No results for input(s): LIPASE, AMYLASE in the last 8760 hours. No results for input(s):  AMMONIA in the last 8760 hours. CBC: Recent Labs    07/16/17 1435  WBC 6.0  NEUTROABS 3,960  HGB 16.7*  HCT 50.4*  MCV 93.2  PLT 152   Lipid Panel: No results for input(s): CHOL, HDL, LDLCALC, TRIG, CHOLHDL, LDLDIRECT in the last 8760 hours. Lab Results  Component Value Date   HGBA1C 5.4 07/16/2017    Procedures since last visit: No results found.  Assessment/Plan 1. Parkinson disease ([place]) -has progressed to where she's hardly able to stand up without being pulled up by someone else, uses rollator to walk and needs help with all adls  2. Chronic diastolic CHF (congestive heart failure) (HCC) -stable w/o exacerbation, cont same regimen  3. Polycythemia vera ([place]) -f/u cbc today, hasn't had one in 9 mos  4. Benign essential hypertension -bp not at goal, but attempts to lower it have resulted in orthostatic hypotension and falls due to her autonomic insufficiency from her parkinson's  5. Hyperglycemia - f/u labs due to sweets intake, but weight is down 4 lbs - CBC with Differential/Platelet - COMPLETE METABOLIC PANEL WITH GFR - Hemoglobin A1c  6. Hypothyroidism, unspecified type -f/u tsh, cont same regimen  7. Dementia due to Parkinson's disease without behavioral disturbance ([place]) -seems to have progressed with more difficulty following commands and with adls  8. Pressure injury of left buttock, stage 2 ([place]) -remains but smaller and less bothersome with current treatments--cont same (as in  hpi)  Labs/tests ordered:  Orders Placed This Encounter  Procedures  . CBC with Differential/Platelet  . COMPLETE METABOLIC PANEL WITH GFR  . Hemoglobin A1c    Next appt:  6 mos med mgt  Irvin Lizama L. Stokes Rattigan, D.O. [facility] [address], [place] [address] Cell Phone (Mon-Fri 8am-5pm):  [phone] On Call:  [phone] & follow prompts after 5pm & weekends Office Phone:  [phone] Office Fax:  [phone]

## 2018-04-09 ENCOUNTER — Telehealth: Payer: Self-pay | Admitting: *Deleted

## 2018-04-09 ENCOUNTER — Encounter: Payer: Self-pay | Admitting: *Deleted

## 2018-04-09 LAB — COMPLETE METABOLIC PANEL WITH GFR
AG Ratio: 2 (calc) (ref 1.0–2.5)
ALT: 4 U/L — ABNORMAL LOW (ref 6–29)
AST: 12 U/L (ref 10–35)
Albumin: 4 g/dL (ref 3.6–5.1)
Alkaline phosphatase (APISO): 62 U/L (ref 37–153)
BUN/Creatinine Ratio: 18 (calc) (ref 6–22)
BUN: 28 mg/dL — ABNORMAL HIGH (ref 7–25)
CO2: 29 mmol/L (ref 20–32)
Calcium: 9.9 mg/dL (ref 8.6–10.4)
Chloride: 108 mmol/L (ref 98–110)
Creat: 1.57 mg/dL — ABNORMAL HIGH (ref 0.60–0.88)
GFR, Est African American: 34 mL/min/{1.73_m2} — ABNORMAL LOW (ref >=60)
GFR, Est Non African American: 29 mL/min/{1.73_m2} — ABNORMAL LOW (ref >=60)
Globulin: 2 g/dL (calc) (ref 1.9–3.7)
Glucose, Bld: 93 mg/dL (ref 65–139)
Potassium: 4.2 mmol/L (ref 3.5–5.3)
Sodium: 141 mmol/L (ref 135–146)
Total Bilirubin: 1 mg/dL (ref 0.2–1.2)
Total Protein: 6 g/dL — ABNORMAL LOW (ref 6.1–8.1)

## 2018-04-09 LAB — CBC WITH DIFFERENTIAL/PLATELET
Absolute Monocytes: 565 cells/uL (ref 200–950)
Basophils Absolute: 40 cells/uL (ref 0–200)
Basophils Relative: 0.8 %
Eosinophils Absolute: 70 cells/uL (ref 15–500)
Eosinophils Relative: 1.4 %
HCT: 47.8 % — ABNORMAL HIGH (ref 35.0–45.0)
Hemoglobin: 16 g/dL — ABNORMAL HIGH (ref 11.7–15.5)
Lymphs Abs: 1240 cells/uL (ref 850–3900)
MCH: 31.6 pg (ref 27.0–33.0)
MCHC: 33.5 g/dL (ref 32.0–36.0)
MCV: 94.5 fL (ref 80.0–100.0)
MPV: 11.1 fL (ref 7.5–12.5)
Monocytes Relative: 11.3 %
Neutro Abs: 3085 cells/uL (ref 1500–7800)
Neutrophils Relative %: 61.7 %
Platelets: 179 10*3/uL (ref 140–400)
RBC: 5.06 10*6/uL (ref 3.80–5.10)
RDW: 13.1 % (ref 11.0–15.0)
Total Lymphocyte: 24.8 %
WBC: 5 10*3/uL (ref 3.8–10.8)

## 2018-04-09 LAB — HEMOGLOBIN A1C
Hgb A1c MFr Bld: 5.6 % of total Hgb (ref <5.7)
Mean Plasma Glucose: 114 (calc)
eAG (mmol/L): 6.3 (calc)

## 2018-04-09 NOTE — Telephone Encounter (Signed)
Andrea Dorsey notified and agreed.

## 2018-04-09 NOTE — Telephone Encounter (Signed)
Polycythemia is the condition that patient previously saw Dr. Alen Blew about from hematology.  Fortunately, her high blood counts never reached a worrisome level where further investigation of her bone marrow was needed.  They are actually closer to normal now than they were when she was referred to him about 4 years ago.  Her baby aspirin protects her from blood clots from this condition. We will monitor her blood count as we have and no new interventions are needed.   As far as her declining kidney function, it's affected by her fluid intake.  It also does gradually decline over time with age.  She is on diuretics for her heart failure which are necessary.  She should hydrate well with water and avoid sodas that may worsen the condition.

## 2018-04-09 NOTE — Telephone Encounter (Signed)
Imogene Burn, daughter called and stated that she looked at patient's lab results on myChart. Stated that she wasn't aware of the Polycythemia and wants to know exactly what this is and how is it treated.  Also wants to know what to do about her declining kidney functions. Please Advise.

## 2018-04-15 ENCOUNTER — Ambulatory Visit: Payer: Medicare Other | Admitting: Neurology

## 2018-05-24 ENCOUNTER — Other Ambulatory Visit: Payer: Self-pay

## 2018-05-24 ENCOUNTER — Telehealth: Payer: Self-pay

## 2018-05-24 ENCOUNTER — Ambulatory Visit (INDEPENDENT_AMBULATORY_CARE_PROVIDER_SITE_OTHER): Payer: Medicare Other | Admitting: Internal Medicine

## 2018-05-24 ENCOUNTER — Encounter: Payer: Self-pay | Admitting: Internal Medicine

## 2018-05-24 DIAGNOSIS — I5032 Chronic diastolic (congestive) heart failure: Secondary | ICD-10-CM

## 2018-05-24 DIAGNOSIS — I10 Essential (primary) hypertension: Secondary | ICD-10-CM | POA: Diagnosis not present

## 2018-05-24 DIAGNOSIS — R739 Hyperglycemia, unspecified: Secondary | ICD-10-CM | POA: Diagnosis not present

## 2018-05-24 DIAGNOSIS — G20 Parkinson's disease: Secondary | ICD-10-CM | POA: Diagnosis not present

## 2018-05-24 DIAGNOSIS — E039 Hypothyroidism, unspecified: Secondary | ICD-10-CM

## 2018-05-24 DIAGNOSIS — D45 Polycythemia vera: Secondary | ICD-10-CM | POA: Diagnosis not present

## 2018-05-24 DIAGNOSIS — F0280 Dementia in other diseases classified elsewhere without behavioral disturbance: Secondary | ICD-10-CM

## 2018-05-24 DIAGNOSIS — G20A1 Parkinson's disease without dyskinesia, without mention of fluctuations: Secondary | ICD-10-CM

## 2018-05-24 NOTE — Progress Notes (Signed)
This service is provided via telemedicine  No vital signs collected/recorded due to the encounter was a telemedicine visit.   Location of patient (ex: home, work):  Home   Patient consents to a telephone visit:  Yes  Location of the provider (ex: office, home):  Office     Names of all persons participating in the telemedicine service and their role in the encounter:  Ruthell Rummage CMA, Dr. Hollace Kinnier, Flo Shanks, and Daughter [name]   Time spent on call:  Ruthell Rummage CMA spent  6  Minutes on phone with patient.     Provider:  Rexene Edison. [name], D.O., C.M.D.  Code Status: DNR Goals of Care:  Advanced Directives 10/23/2017  Does Patient Have a Medical Advance Directive? Yes  Type of Advance Directive [place]  Does patient want to make changes to medical advance directive? No - Patient declined  Copy of [place] in Chart? Yes  Would patient like information on creating a medical advance directive? -  Pre-existing out of facility DNR order (yellow form or pink MOST form) -     Chief Complaint  Patient presents with  . Acute Visit    [place] this morning no injuries, states she experiencing weakness in her legs, daughter would like to discuss LTC     HPI: Patient is a 85 y.o. female seen today via facetime virtual visit with Ms. Paletta and her daughter, [name] who were at home while I was in my office.  More confusion in the mornings vs. Later in the day.  It was 1pm today until she perked up.  She had fallen this am.  She has continued to struggle with increased freezing in the ams and decreased mobility.  Appears she cannot focus as well when seeing.  She is meant to get an exam when things open up.  She has urinary incontinence.  It's yellow.  She's doing a little better at water intake, but still not great.  Has a sippy cup next to her chair.  Does drink cranberry juice in the morning, otherwise ginger ale or lemonade.  BP  163/89 at 12:30 this afternoon.  Not taking daily.  It's been staying in that general range.  Weight was 198.8.  Not eating as much.  She goes in spurts.  She does not sleep well at night.  She hallucinates more.  She tells people to get out of her room that are not there.  That happens often.  sometimes she will think there's a small child in the bed with her or cats under the bed or on the floor.  Carbidopa/levodopa had been increased in march.  So it's a balance b/w freezing and hallucinating.  She does not return to [name] until September.    She is freezing more in the morning and her sluggishness takes longer.    Will need PPD placed for placement if [name] decides to outright pursue a move for her mother to [place].   FL-2 form completed today  Past Medical History:  Diagnosis Date  . Benign essential hypertension   . Dementia in Parkinson's disease ([place])   . Depression   . History of necrotizing fasciitis    left leg, s/p debridement and graft  . Hypothyroidism   . Osteoarthritis, generalized   . Parkinson disease ([place])   . Spinal stenosis     Past Surgical History:  Procedure Laterality Date  . ABDOMINAL HYSTERECTOMY  1977  . SKIN DEBRIDEMENT  2014   Brambhelt, MD  . SKIN GRAFT  2014   Brambhelt MD  . SPINE SURGERY  2006   spinal stenosis    Allergies  Allergen Reactions  . Piperacillin-Tazobactam In Dex Rash    Unclear whether associated with clindamycin or zosyn, but probably more likely to be zosyn.   [name] Dye [Iodinated Diagnostic Agents]     Only when intravenous, not on external skin.  . Clindamycin Rash    Unclear whether patient has an actual allergy to clindamycin. On beta-lactam at the same time.    Outpatient Encounter Medications as of 05/24/2018  Medication Sig  . acetaminophen (TYLENOL) 500 MG tablet Take 500 mg by mouth every 6 (six) hours as needed for mild pain.   Marland Kitchen aspirin EC 81 MG tablet Take 81 mg by mouth daily.  . carbidopa-levodopa  (PARCOPA) 25-100 MG disintegrating tablet Take 1 tablet by mouth 2 (two) times daily as needed.  . carbidopa-levodopa (SINEMET CR) 50-200 MG tablet TAKE 1 TABLET BY MOUTH AT  BEDTIME  . carbidopa-levodopa (SINEMET IR) 25-100 MG tablet Take 2 tablets by mouth 4 (four) times daily.  . Cholecalciferol (VITAMIN D3) 2000 UNITS TABS Take 2,000 Units by mouth daily.   . entacapone (COMTAN) 200 MG tablet TAKE 1 TABLET BY MOUTH 4  TIMES DAILY  . escitalopram (LEXAPRO) 10 MG tablet TAKE 1 TABLET BY MOUTH  DAILY  . hydrALAZINE (APRESOLINE) 25 MG tablet TAKE 1 TABLET BY MOUTH 3  TIMES DAILY  . levothyroxine (SYNTHROID, LEVOTHROID) 25 MCG tablet TAKE ONE TABLET BY MOUTH  ONCE DAILY 30 MINUTES  BEFORE BREAKFAST FOR  THYROID  . metoprolol succinate (TOPROL-XL) 25 MG 24 hr tablet TAKE 2 TABLETS BY MOUTH TWO TIMES DAILY   No facility-administered encounter medications on file as of 05/24/2018.     Review of Systems:  Review of Systems  Constitutional: Positive for malaise/fatigue. Negative for chills and fever.  HENT: Negative for hearing loss.   Eyes:       Struggling to focus--needs eye exam  Respiratory: Negative for cough and shortness of breath.   Cardiovascular: Positive for leg swelling. Negative for chest pain and palpitations.       Chronic venous insufficiency but no increase in her swelling, weight down  Gastrointestinal: Positive for constipation. Negative for abdominal pain, blood in stool, diarrhea and melena.  Genitourinary: Negative for dysuria.       Urine dark yellow and malodorous (does not drink water, only other beverages and not much)  Musculoskeletal: Negative for back pain and joint pain.  Skin: Negative for itching and rash.       Some vaginal excoriation--Wanda notes pt scratched herself it appears  Neurological: Negative for dizziness and loss of consciousness.       History of orthostatic dizziness so allow bp to run a little high  Endo/Heme/Allergies: Bruises/bleeds easily.   Psychiatric/Behavioral: Positive for hallucinations and memory loss. Negative for depression. The patient has insomnia. The patient is not nervous/anxious.     Health Maintenance  Topic Date Due  . TETANUS/TDAP  02/28/1948  . DEXA SCAN  02/29/1996  . INFLUENZA VACCINE  08/21/2018  . PNA vac Low Risk Adult  Completed    Physical Exam: Could not be performed as visit non face-to-face via phone   Labs reviewed: Basic Metabolic Panel: Recent Labs    07/16/17 1435 04/08/18 1529  NA 142 141  K 4.2 4.2  CL 108 108  CO2 28 29  GLUCOSE 109  93  BUN 24 28*  CREATININE 1.39* 1.57*  CALCIUM 9.7 9.9  TSH 3.52  --    Liver Function Tests: Recent Labs    07/16/17 1435 04/08/18 1529  AST 11 12  ALT 3* 4*  BILITOT 1.3* 1.0  PROT 6.2 6.0*   No results for input(s): LIPASE, AMYLASE in the last 8760 hours. No results for input(s): AMMONIA in the last 8760 hours. CBC: Recent Labs    07/16/17 1435 04/08/18 1529  WBC 6.0 5.0  NEUTROABS 3,960 3,085  HGB 16.7* 16.0*  HCT 50.4* 47.8*  MCV 93.2 94.5  PLT 152 179   Lipid Panel: No results for input(s): CHOL, HDL, LDLCALC, TRIG, CHOLHDL, LDLDIRECT in the last 8760 hours. Lab Results  Component Value Date   HGBA1C 5.6 04/08/2018     Assessment/Plan 1. Parkinson disease ([place]) -continues to progress -even after increased sinemet, still having more morning freezing and slower starts -now with psychosis also  2. Dementia due to Parkinson's disease without behavioral disturbance ([place]) -disoriented at times, needs help with bathing, dressing, transfers  3. 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 for her input about intervention--?reduce sinemet again with consequence of more freezing vs continue with the hallucinations  4. Chronic diastolic CHF (congestive heart failure) (HCC) -stable, does not hydrate  well; losing weight and w/o signs of volume overload  5. Polycythemia vera ([place]) -has been stable for years  6. Benign essential hypertension -bp well enough controlled as her orthostatic hypotension allows, cont same regimen  7. Hyperglycemia -not on a carb-modified diet, but would benefit from this and more exercise (limited by her mobility with PD)  8. Hypothyroidism, unspecified type -cont current levothyroxine -will need labs at next visit Lab Results  Component Value Date   TSH 3.52 07/16/2017    9. Morbid (severe) obesity due to excess calories ([place]) -ongoing, but did lose some weight per [name] as she's going through a spurt of less intake   Labs/tests ordered:  No new Next appt:  10/26/2018 Non face-to-face time spent on virtual visit:  29 minutes  Meilyn Heindl L. Ridhi Hoffert, D.O. [facility] [address], [place] [address] Cell Phone (Mon-Fri 8am-5pm):  [phone] On Call:  [phone] & follow prompts after 5pm & weekends Office Phone:  [phone] Office Fax:  [phone]

## 2018-05-24 NOTE — Telephone Encounter (Signed)
Incoming fax received from [facility] requesting completion of FL2 and FL2 addendum. Patient will be moving to [facility]   Provider and [facility] demographic information filled in and placed in Dr.Reed's review and sign folder for completion.

## 2018-05-24 NOTE — Telephone Encounter (Signed)
Form completed today.  Pt will need PPD placed and [name] said she will call next week to arrange this.

## 2018-05-24 NOTE — Telephone Encounter (Signed)
Forms faxed. A copy will be sent to scanning and the original will be held in Dee's (Dr.Reed's assistant) folder until patient schedules PPD placement and reading

## 2018-05-24 NOTE — Patient Instructions (Signed)
Please call to arrange PPD placement if you decide about admission to an assisted living with additional CNA help or SNF which appears to be the level of care she really needs.

## 2018-05-25 ENCOUNTER — Telehealth: Payer: Self-pay | Admitting: Internal Medicine

## 2018-05-25 NOTE — Telephone Encounter (Signed)
I reached out to [name] about Andrea Dorsey's recent hallucinations that seemed to correlate with her increase in sinemet to 2 tablets 4 times daily with minimal gains in terms of her freezing/mobility.  We agreed to attempt to decrease the dose back down a bit.  We'll try sinemet 1.5 tablets QID as she'd been on prior to that increase.  Please notify her daughter, [name], so she can adjust the medication (cut the tablets).

## 2018-05-26 ENCOUNTER — Ambulatory Visit: Payer: Self-pay | Admitting: Internal Medicine

## 2018-05-26 DIAGNOSIS — N390 Urinary tract infection, site not specified: Secondary | ICD-10-CM | POA: Diagnosis not present

## 2018-05-27 NOTE — Telephone Encounter (Signed)
Ok.  Pt needs to drink more water, but that's already been discussed.

## 2018-05-27 NOTE — Telephone Encounter (Signed)
Spoke with daughter and she took pt to Urgent Care and pt was diagnosed with a UTI and they gave her Augmentin 500/125 twice daily for 5 days. Daughter does not want to change the sinement back to 1.5 mg QID yet until she see if the UTI was the problem. Please advise

## 2018-05-28 ENCOUNTER — Telehealth: Payer: Self-pay | Admitting: Internal Medicine

## 2018-05-28 NOTE — Telephone Encounter (Signed)
Called and spoke with Andrea Dorsey answering her questions.

## 2018-05-28 NOTE — Telephone Encounter (Signed)
Pt is at home & family has reached out to her(Elizabeth,RN) regarding AL & needs additional info to move forward.  Please call at [facility]  Thanks, Lattie Haw

## 2018-05-28 NOTE — Telephone Encounter (Signed)
[facility] calling with questions about the FL-2 form, they are a Assisted Living facility and the FL-2 is stating skilled care is needed. She also had questions about a pressure sore she had. They would like to speak with Dr. [name] if possible.

## 2018-05-31 ENCOUNTER — Telehealth: Payer: Self-pay | Admitting: *Deleted

## 2018-05-31 NOTE — Telephone Encounter (Signed)
Imogene Burn, HCPOA called and stated that the [facility] form was marked SNF and Nanine Means is a [facility] and they need it changed. Daughter stated when you change that they need the date of the form changed as well due to being only good for 30 days. Please Advise.

## 2018-05-31 NOTE — Telephone Encounter (Signed)
Ok to change the form to ALF if they are willing to accept her there.  Hopefully, we scanned it as we always should in these scenarios.

## 2018-06-02 NOTE — Telephone Encounter (Signed)
FL2 was sent for scanning but has not been scanned in yet.  Filled out another one and placed in Dr. Cyndi Lennert folder to review and sign.

## 2018-06-03 ENCOUNTER — Encounter: Payer: Self-pay | Admitting: Internal Medicine

## 2018-06-03 NOTE — Telephone Encounter (Signed)
Routed to Reed, Tiffany L, DO  

## 2018-06-07 NOTE — Telephone Encounter (Signed)
Andrea Dorsey called and stated that she would like the [facility] form faxed to [place] at [facility] Fax: [phone].

## 2018-06-16 ENCOUNTER — Encounter: Payer: Self-pay | Admitting: Internal Medicine

## 2018-06-16 NOTE — Telephone Encounter (Signed)
Routed to Dr.Reed and her medical assistant DeShannon Tamala Julian

## 2018-06-21 ENCOUNTER — Ambulatory Visit (INDEPENDENT_AMBULATORY_CARE_PROVIDER_SITE_OTHER): Payer: Medicare Other | Admitting: Internal Medicine

## 2018-06-21 ENCOUNTER — Encounter: Payer: Self-pay | Admitting: Internal Medicine

## 2018-06-21 ENCOUNTER — Other Ambulatory Visit: Payer: Self-pay

## 2018-06-21 VITALS — BP 120/70 | HR 72 | Temp 98.0°F | Ht 65.0 in | Wt 190.0 lb

## 2018-06-21 DIAGNOSIS — F0280 Dementia in other diseases classified elsewhere without behavioral disturbance: Secondary | ICD-10-CM

## 2018-06-21 DIAGNOSIS — Z111 Encounter for screening for respiratory tuberculosis: Secondary | ICD-10-CM

## 2018-06-21 DIAGNOSIS — L910 Hypertrophic scar: Secondary | ICD-10-CM | POA: Diagnosis not present

## 2018-06-21 DIAGNOSIS — G20 Parkinson's disease: Secondary | ICD-10-CM

## 2018-06-21 DIAGNOSIS — G20A1 Parkinson's disease without dyskinesia, without mention of fluctuations: Secondary | ICD-10-CM

## 2018-06-21 NOTE — Progress Notes (Signed)
Location:  [facility] clinic Provider:  Cabell Lazenby L. [name], D.O., C.M.D.  Code Status: DNR which has been discussed and gold form completed historically  Goals of Care:  Advanced Directives 10/23/2017  Does Patient Have a Medical Advance Directive? Yes  Type of Advance Directive [place]  Does patient want to make changes to medical advance directive? No - Patient declined  Copy of [place] in Chart? Yes  Would patient like information on creating a medical advance directive? -  Pre-existing out of facility DNR order (yellow form or pink MOST form) -     Chief Complaint  Patient presents with  . Medical Management of Chronic Issues    FL-2, TB skin test, pressure sore    HPI: Patient is a 85 y.o. female seen today for FL-2, sore on buttocks and PPD placement.  [name] plans to place her now in a residential care home--[facility].  We've done several FL-2s but facilities have not had adequate staffing ratios.    She was treated for a UTI and the odor and abnormalities of the urine are looking better per [name].  Sore on bottom bothersome--only has keloid there and scar tissue where prior pressure injury had been located on left buttock.  Past Medical History:  Diagnosis Date  . Benign essential hypertension   . Dementia in Parkinson's disease ([place])   . Depression   . History of necrotizing fasciitis    left leg, s/p debridement and graft  . Hypothyroidism   . Osteoarthritis, generalized   . Parkinson disease ([place])   . Spinal stenosis     Past Surgical History:  Procedure Laterality Date  . ABDOMINAL HYSTERECTOMY  1977  . SKIN DEBRIDEMENT  2014   [place], [place]  . SKIN GRAFT  2014   [place]  2006   spinal stenosis    Allergies  Allergen Reactions  . Piperacillin-Tazobactam In Dex Rash    Unclear whether associated with clindamycin or zosyn, but probably more likely to be zosyn.   [name] Dye [[employer]]     Only when intravenous, not on external skin.  . Clindamycin Rash    Unclear whether patient has an actual allergy to clindamycin. On beta-lactam at the same time.    Outpatient Encounter Medications as of 06/21/2018  Medication Sig  . acetaminophen (TYLENOL) 500 MG tablet Take 500 mg by mouth every 6 (six) hours as needed for mild pain.   Marland Kitchen aspirin EC 81 MG tablet Take 81 mg by mouth daily.  . carbidopa-levodopa (PARCOPA) 25-100 MG disintegrating tablet Take 1 tablet by mouth 2 (two) times daily as needed.  . carbidopa-levodopa (SINEMET CR) 50-200 MG tablet TAKE 1 TABLET BY MOUTH AT  BEDTIME  . carbidopa-levodopa (SINEMET IR) 25-100 MG tablet Take 2 tablets by mouth 4 (four) times daily.  . Cholecalciferol (VITAMIN D3) 2000 UNITS TABS Take 2,000 Units by mouth daily.   . entacapone (COMTAN) 200 MG tablet TAKE 1 TABLET BY MOUTH 4  TIMES DAILY  . escitalopram (LEXAPRO) 10 MG tablet TAKE 1 TABLET BY MOUTH  DAILY  . hydrALAZINE (APRESOLINE) 25 MG tablet TAKE 1 TABLET BY MOUTH 3  TIMES DAILY  . levothyroxine (SYNTHROID, LEVOTHROID) 25 MCG tablet TAKE ONE TABLET BY MOUTH  ONCE DAILY 30 MINUTES  BEFORE BREAKFAST FOR  THYROID  . metoprolol succinate (TOPROL-XL) 25 MG 24 hr tablet TAKE 2 TABLETS BY MOUTH TWO TIMES DAILY   No  facility-administered encounter medications on file as of 06/21/2018.     Review of Systems:  Review of Systems  Constitutional: Positive for malaise/fatigue. Negative for chills and fever.  HENT: Negative for congestion.   Eyes: Positive for blurred vision.  Respiratory: Negative for cough and shortness of breath.   Cardiovascular: Positive for leg swelling. Negative for chest pain and palpitations.  Gastrointestinal: Positive for constipation. Negative for abdominal pain, blood in stool, diarrhea and melena.  Genitourinary: Negative for dysuria.       Some incontinence, but does have awareness and sometimes able to indicate when needs to urinate    Musculoskeletal: Positive for back pain. Negative for falls.  Skin: Negative for itching and rash.  Neurological: Positive for tremors. Negative for dizziness and loss of consciousness.  Psychiatric/Behavioral: Positive for memory loss. Negative for depression. The patient is not nervous/anxious and does not have insomnia.     Health Maintenance  Topic Date Due  . TETANUS/TDAP  02/28/1948  . DEXA SCAN  02/29/1996  . INFLUENZA VACCINE  08/21/2018  . PNA vac Low Risk Adult  Completed    Physical Exam: Vitals:   06/21/18 1137  Weight: 190 lb (86.2 kg)  Height: 5\' 5"  (1.651 m)   Body mass index is 31.62 kg/m. Physical Exam Constitutional:      Appearance: She is obese.     Comments: Appears to be distracted by outside stimuli these past two visits rather than making good eye contact with me as she used to do  Cardiovascular:     Rate and Rhythm: Normal rate and regular rhythm.     Pulses: Normal pulses.     Heart sounds: Normal heart sounds.  Pulmonary:     Effort: Pulmonary effort is normal.     Breath sounds: Normal breath sounds. No rales.  Musculoskeletal: Normal range of motion.  Skin:    General: Skin is warm and dry.     Capillary Refill: Capillary refill takes less than 2 seconds.  Neurological:     Mental Status: She is alert.     Motor: Weakness present.     Gait: Gait abnormal.     Comments: Shuffling gait, freezing, requires help from her daughter to get up out of chair, uses rollator  Psychiatric:        Mood and Affect: Mood normal.     Labs reviewed: Basic Metabolic Panel: Recent Labs    07/16/17 1435 04/08/18 1529  NA 142 141  K 4.2 4.2  CL 108 108  CO2 28 29  GLUCOSE 109 93  BUN 24 28*  CREATININE 1.39* 1.57*  CALCIUM 9.7 9.9  TSH 3.52  --    Liver Function Tests: Recent Labs    07/16/17 1435 04/08/18 1529  AST 11 12  ALT 3* 4*  BILITOT 1.3* 1.0  PROT 6.2 6.0*   No results for input(s): LIPASE, AMYLASE in the last 8760 hours. No  results for input(s): AMMONIA in the last 8760 hours. CBC: Recent Labs    07/16/17 1435 04/08/18 1529  WBC 6.0 5.0  NEUTROABS 3,960 3,085  HGB 16.7* 16.0*  HCT 50.4* 47.8*  MCV 93.2 94.5  PLT 152 179   Lipid Panel: No results for input(s): CHOL, HDL, LDLCALC, TRIG, CHOLHDL, LDLDIRECT in the last 8760 hours. Lab Results  Component Value Date   HGBA1C 5.6 04/08/2018    Procedures since last visit: No results found.  Assessment/Plan 1. Parkinson disease ([place]) -is for placement in residential care home now -  her daughter at least needs respite if not a long-term solution -facility is in [place] so PCP will be changing -she will have close supervision and aid assistance -FL2 done and copy made for CMA to hold onto and original given to [place]  2. Dementia due to Parkinson's disease without behavioral disturbance ([place]) -has been worse lately--only slight very transient cognitive improvement with abx for "UTI"--her daughter did bring a copy of the positive urinalysis, and reports urine culture was positive and susceptible to the antibiotics prescribed and completed -I had thought last time I saw her that she did not actually have a UTI just progression of her dementia with more hallucinations with higher dose of sinemet  3. 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 and she did think the dose could be decreased to 1.5 tablets from 2 tablets tid)  4. Keloid of skin -of left buttock at prior pressure injury site--she's had this scar tissue a long time and it bothers her and gets broken open with excoriation   5. Screening for tuberculosis - PPD placed today--to return Wednesday for car visit for PPD reading and copy of documentation - TB  Skin Test  Labs/tests ordered:   Orders Placed This Encounter  Procedures  . TB Skin Test    PPD given Left Forearm.  Instruction to come back to have read on Wednesday.    Order Specific Question:   Has patient ever tested positive?    Answer:   No   Next appt:  10/26/2018   Derick Seminara L. Demarious Kapur, D.O. [facility] [address],  [address] Cell Phone (Mon-Fri 8am-5pm):  [phone] On Call:  [phone] & follow prompts after 5pm & weekends Office Phone:  [phone] Office Fax:  [phone]

## 2018-06-21 NOTE — Patient Instructions (Addendum)
Please return Wednesday for a car visit for PPD reading.    If you find a physician in [place], we can release records directly to them for her future care.

## 2018-06-22 ENCOUNTER — Encounter: Payer: Self-pay | Admitting: Neurology

## 2018-06-22 ENCOUNTER — Telehealth: Payer: Self-pay | Admitting: Neurology

## 2018-06-22 NOTE — Telephone Encounter (Signed)
Letter furnished, sent to mychart.

## 2018-06-22 NOTE — Telephone Encounter (Signed)
Please see today's emails from patient's daughter for reference.  Please furnish a letter of support to the residential care facility, as requested:  [name] has been a patient of mine since * and has a Dx of PD and associated dementia.  In the past few years, she has had intermittent spells of unresponsiveness or decreased alertness, witnessed by her daughter, during which the patient was not noted to have any blood pressure issues, or convulsion or twitching; spells can last a few hours even. No obvious triggers, no pattern has been determined.She has had ongoing difficulty with sleep initiation and maintenance at night. She has been tried on multiple medications in the past including Ambien, amitriptyline, clonazepam, and Seroquel at different times.Previous workup had included EEG and brain MRI as well as head MRA. I have never witnessed such a spell in the office.  I have not been able to determine if these comprise an anxiety spell, severe sleepiness, or lethargy, hypotension, a staring spell like a seizure, or a behavioral spell. It is not typical to see periods of unresponsiveness in the context of Parkinson's disease. My recommendation is, if you deem the resident not responsive, to call 911.

## 2018-06-24 ENCOUNTER — Ambulatory Visit
Admission: RE | Admit: 2018-06-24 | Discharge: 2018-06-24 | Disposition: A | Discharge status: Home / Self Care | Payer: Medicare Other | Source: Ambulatory Visit | Attending: Internal Medicine | Admitting: Internal Medicine

## 2018-06-24 ENCOUNTER — Other Ambulatory Visit: Payer: Self-pay

## 2018-06-24 ENCOUNTER — Ambulatory Visit (INDEPENDENT_AMBULATORY_CARE_PROVIDER_SITE_OTHER): Payer: Medicare Other

## 2018-06-24 VITALS — Resp 10 | Ht 65.0 in | Wt 190.0 lb

## 2018-06-24 DIAGNOSIS — J9811 Atelectasis: Secondary | ICD-10-CM | POA: Diagnosis not present

## 2018-06-24 DIAGNOSIS — Z111 Encounter for screening for respiratory tuberculosis: Secondary | ICD-10-CM

## 2018-06-24 DIAGNOSIS — R7611 Nonspecific reaction to tuberculin skin test without active tuberculosis: Secondary | ICD-10-CM | POA: Diagnosis not present

## 2018-06-24 DIAGNOSIS — J90 Pleural effusion, not elsewhere classified: Secondary | ICD-10-CM | POA: Diagnosis not present

## 2018-06-24 DIAGNOSIS — I517 Cardiomegaly: Secondary | ICD-10-CM | POA: Diagnosis not present

## 2018-06-24 LAB — TB SKIN TEST
Induration: 10 mm
TB Skin Test: POSITIVE

## 2018-06-28 ENCOUNTER — Encounter: Payer: Self-pay | Admitting: Internal Medicine

## 2018-07-01 ENCOUNTER — Other Ambulatory Visit: Payer: Self-pay | Admitting: *Deleted

## 2018-07-01 MED ORDER — HYDRALAZINE HCL 25 MG PO TABS: 25.0000 mg | ORAL_TABLET | Freq: Three times a day (TID) | ORAL | 1 refills | Status: DC

## 2018-07-01 NOTE — Telephone Encounter (Signed)
Andrea Dorsey requested refill to be sent to [facility].

## 2018-07-05 ENCOUNTER — Telehealth: Payer: Self-pay | Admitting: Neurology

## 2018-07-05 NOTE — Telephone Encounter (Signed)
I called SNF back, no answer, left a message asking them to call me back. I'm not sure what the question is and need to clarify what they are asking.

## 2018-07-05 NOTE — Telephone Encounter (Signed)
Administrator Meko Eure and RN Lucienne Capers from [place] is calling in having some questions about pts meds carbidopa-levodopa (PARCOPA) 25-100 MG disintegrating tablet, carbidopa-levodopa (SINEMET CR) 50-200 MG tablet, carbidopa-levodopa (SINEMET IR) 25-100 MG tablet , entacapone (COMTAN) 200 MG tablet. And also if there is any contra indications of dosage and times of med , if so parameters are needed.  CB# [phone]

## 2018-07-05 NOTE — Telephone Encounter (Signed)
Andrea Dorsey returned my call. She needs very specific documentation in a letter form from [name] detailing when to give pt's medications. Specifically, she needs to know what parameters there are for the parcopa admin. (Freezing for "x" amount of time. Give parcopa "x" amount of time away from sinemet, etc).  They also need times to give the sinemet IR, sinemet CR, and comtan.  It needs to be a letter and faxed to [phone].  I called pt's daughter, Andrea Dorsey, per DPR, and discussed. She reports that she gave parcopa infrequently, only when pt was having freezing episodes. She gave sinemet IR at 8am, 12noon, 4pm, and 8pm with the comtan at the same times. She gave sinemet CR at 10pm.

## 2018-07-05 NOTE — Telephone Encounter (Signed)
Please call patient's daughter and explain that it probably is best if we forego treatment with Parcopa as needed because it will be difficult to specify parameters, as the freezing is erratic and may happen repeatedly multiple times a day 1 day or she may have 1 prolonged episode another day and to outline these parameters will be complicated for the staff to administer this correctly on an as-needed basis.  If daughter is agreeable, we will forego treatment with the as needed Parcopa at this point.  Please type up in letter form:  Administer generic Sinemet 25-100 mg strength: 2 tablets 4 times a day at 8 AM, 12, 4 PM and 8 PM. Generic Sinemet CR 50-200 mg strength: 1 tablet at 10 PM. Generic Comtan 200 mg strength 1 tablet 4 times a day at 8, 12, 4 PM and 8 PM. Discontinue Parcopa.

## 2018-07-06 ENCOUNTER — Telehealth: Payer: Self-pay | Admitting: *Deleted

## 2018-07-06 NOTE — Telephone Encounter (Signed)
Our printer has been down. Will fax when it is back up and running.

## 2018-07-06 NOTE — Telephone Encounter (Signed)
Faxed letter to fax # provided. Received a receipt of confirmation.

## 2018-07-06 NOTE — Telephone Encounter (Signed)
[facility] calling asking about pt's dose of Robitussin, should she take this daily and at bedtime? Please advise

## 2018-07-06 NOTE — Telephone Encounter (Signed)
Left message on VM for Meko Eure with results, advised to call back if any questions

## 2018-07-06 NOTE — Telephone Encounter (Signed)
Pt's daughter, [name], per DPR, returned my call, she is agreeable to these medication administration recommendations. Letter will be generated and sent to [facility]. Pt's daughter verbalized understanding.

## 2018-07-06 NOTE — Telephone Encounter (Signed)
I called pt's daughter, [name], per DPR to discuss. No answer, left a message asking her to call me back.

## 2018-07-06 NOTE — Telephone Encounter (Signed)
Daily and at bedtime as needed for cough

## 2018-07-06 NOTE — Telephone Encounter (Signed)
SNF hasnt received fax to   [phone].   FYI

## 2018-07-08 ENCOUNTER — Telehealth: Payer: Self-pay | Admitting: Internal Medicine

## 2018-07-08 NOTE — Telephone Encounter (Signed)
Meko with [facility] called regarding the order for Robitussin.  She stated the order for it was not complete.  She needs to discuss this.

## 2018-07-08 NOTE — Telephone Encounter (Signed)
See response to same question from yesterday.

## 2018-07-08 NOTE — Telephone Encounter (Signed)
Spoke with adult care home they are sending over a new FL-2 form to be signed and they need new rx's sent to the pharmacy (route back to me and I will send)

## 2018-07-09 NOTE — Telephone Encounter (Signed)
Noted.  Thanks, Dee.

## 2018-07-19 DIAGNOSIS — G20 Parkinson's disease: Secondary | ICD-10-CM | POA: Diagnosis not present

## 2018-07-19 DIAGNOSIS — K529 Noninfective gastroenteritis and colitis, unspecified: Secondary | ICD-10-CM | POA: Diagnosis not present

## 2018-07-19 DIAGNOSIS — K625 Hemorrhage of anus and rectum: Secondary | ICD-10-CM | POA: Diagnosis not present

## 2018-07-22 ENCOUNTER — Telehealth: Payer: Self-pay | Admitting: *Deleted

## 2018-07-22 NOTE — Telephone Encounter (Signed)
To treat pt today we would need the notes from her encounter and what exactly was treated. There is no care everywhere attached to pt's chart so we can't see what [facility] did or why. Best bet is to call back to [facility] and let them change antibiotic. Also we need to get the notes from the encounter for pt's chart in the office.

## 2018-07-22 NOTE — Telephone Encounter (Signed)
Home care calling stating that pt was seen over the weekend at a ER in [place] and they found blood in her stool and was prescribed Augmentin 250mg  every 8 hours, per home care pt is now having nausea and they would like a different rx sent to the pharmacy.please advise

## 2018-07-22 NOTE — Telephone Encounter (Signed)
Spoke with Dr. Sheppard Coil verbally and she has suggested that pt be seen, also we would need to get the ER notes to see what was done. Per homecare pt was seen at [facility], they are on Epic and I couldn't find any information.   Spoke homecare and advised pt would need to be seen and they will let daughter know but until then they will try and get the medication in the pt.

## 2018-07-27 DIAGNOSIS — K5150 Left sided colitis without complications: Secondary | ICD-10-CM | POA: Diagnosis not present

## 2018-07-27 DIAGNOSIS — R1310 Dysphagia, unspecified: Secondary | ICD-10-CM | POA: Diagnosis not present

## 2018-07-27 DIAGNOSIS — K219 Gastro-esophageal reflux disease without esophagitis: Secondary | ICD-10-CM | POA: Diagnosis not present

## 2018-08-01 DIAGNOSIS — R944 Abnormal results of kidney function studies: Secondary | ICD-10-CM | POA: Diagnosis not present

## 2018-08-01 DIAGNOSIS — R404 Transient alteration of awareness: Secondary | ICD-10-CM | POA: Diagnosis not present

## 2018-08-01 DIAGNOSIS — G20 Parkinson's disease: Secondary | ICD-10-CM | POA: Diagnosis not present

## 2018-08-01 DIAGNOSIS — R531 Weakness: Secondary | ICD-10-CM | POA: Diagnosis not present

## 2018-08-01 DIAGNOSIS — R638 Other symptoms and signs concerning food and fluid intake: Secondary | ICD-10-CM | POA: Diagnosis not present

## 2018-08-01 DIAGNOSIS — Z743 Need for continuous supervision: Secondary | ICD-10-CM | POA: Diagnosis not present

## 2018-08-02 DIAGNOSIS — Z8669 Personal history of other diseases of the nervous system and sense organs: Secondary | ICD-10-CM | POA: Diagnosis not present

## 2018-08-02 DIAGNOSIS — G20 Parkinson's disease: Secondary | ICD-10-CM | POA: Diagnosis not present

## 2018-08-02 DIAGNOSIS — I272 Pulmonary hypertension, unspecified: Secondary | ICD-10-CM | POA: Diagnosis not present

## 2018-08-02 DIAGNOSIS — R531 Weakness: Secondary | ICD-10-CM | POA: Diagnosis not present

## 2018-08-02 DIAGNOSIS — E860 Dehydration: Secondary | ICD-10-CM | POA: Diagnosis not present

## 2018-08-02 DIAGNOSIS — E039 Hypothyroidism, unspecified: Secondary | ICD-10-CM | POA: Diagnosis not present

## 2018-08-02 DIAGNOSIS — I129 Hypertensive chronic kidney disease with stage 1 through stage 4 chronic kidney disease, or unspecified chronic kidney disease: Secondary | ICD-10-CM | POA: Diagnosis not present

## 2018-08-02 DIAGNOSIS — N182 Chronic kidney disease, stage 2 (mild): Secondary | ICD-10-CM | POA: Diagnosis not present

## 2018-08-03 DIAGNOSIS — I10 Essential (primary) hypertension: Secondary | ICD-10-CM | POA: Diagnosis not present

## 2018-08-03 DIAGNOSIS — E860 Dehydration: Secondary | ICD-10-CM | POA: Diagnosis not present

## 2018-08-03 DIAGNOSIS — Z8669 Personal history of other diseases of the nervous system and sense organs: Secondary | ICD-10-CM | POA: Diagnosis not present

## 2018-08-03 DIAGNOSIS — I129 Hypertensive chronic kidney disease with stage 1 through stage 4 chronic kidney disease, or unspecified chronic kidney disease: Secondary | ICD-10-CM | POA: Diagnosis not present

## 2018-08-03 DIAGNOSIS — G20 Parkinson's disease: Secondary | ICD-10-CM | POA: Diagnosis not present

## 2018-08-03 DIAGNOSIS — E039 Hypothyroidism, unspecified: Secondary | ICD-10-CM | POA: Diagnosis not present

## 2018-08-03 DIAGNOSIS — R531 Weakness: Secondary | ICD-10-CM | POA: Diagnosis not present

## 2018-08-03 DIAGNOSIS — I272 Pulmonary hypertension, unspecified: Secondary | ICD-10-CM | POA: Diagnosis not present

## 2018-08-03 DIAGNOSIS — N182 Chronic kidney disease, stage 2 (mild): Secondary | ICD-10-CM | POA: Diagnosis not present

## 2018-08-04 DIAGNOSIS — G20 Parkinson's disease: Secondary | ICD-10-CM | POA: Diagnosis not present

## 2018-08-04 DIAGNOSIS — R531 Weakness: Secondary | ICD-10-CM | POA: Diagnosis not present

## 2018-08-04 DIAGNOSIS — I272 Pulmonary hypertension, unspecified: Secondary | ICD-10-CM | POA: Diagnosis not present

## 2018-08-04 DIAGNOSIS — I361 Nonrheumatic tricuspid (valve) insufficiency: Secondary | ICD-10-CM | POA: Diagnosis not present

## 2018-08-04 DIAGNOSIS — E860 Dehydration: Secondary | ICD-10-CM | POA: Diagnosis not present

## 2018-08-04 DIAGNOSIS — N182 Chronic kidney disease, stage 2 (mild): Secondary | ICD-10-CM | POA: Diagnosis not present

## 2018-08-04 DIAGNOSIS — I129 Hypertensive chronic kidney disease with stage 1 through stage 4 chronic kidney disease, or unspecified chronic kidney disease: Secondary | ICD-10-CM | POA: Diagnosis not present

## 2018-08-04 DIAGNOSIS — Z8669 Personal history of other diseases of the nervous system and sense organs: Secondary | ICD-10-CM | POA: Diagnosis not present

## 2018-08-04 DIAGNOSIS — I371 Nonrheumatic pulmonary valve insufficiency: Secondary | ICD-10-CM | POA: Diagnosis not present

## 2018-08-04 DIAGNOSIS — I5030 Unspecified diastolic (congestive) heart failure: Secondary | ICD-10-CM | POA: Diagnosis not present

## 2018-08-04 DIAGNOSIS — E039 Hypothyroidism, unspecified: Secondary | ICD-10-CM | POA: Diagnosis not present

## 2018-08-05 DIAGNOSIS — R531 Weakness: Secondary | ICD-10-CM | POA: Diagnosis not present

## 2018-08-05 DIAGNOSIS — I129 Hypertensive chronic kidney disease with stage 1 through stage 4 chronic kidney disease, or unspecified chronic kidney disease: Secondary | ICD-10-CM | POA: Diagnosis not present

## 2018-08-05 DIAGNOSIS — E039 Hypothyroidism, unspecified: Secondary | ICD-10-CM | POA: Diagnosis not present

## 2018-08-05 DIAGNOSIS — Z8669 Personal history of other diseases of the nervous system and sense organs: Secondary | ICD-10-CM | POA: Diagnosis not present

## 2018-08-05 DIAGNOSIS — R269 Unspecified abnormalities of gait and mobility: Secondary | ICD-10-CM | POA: Diagnosis not present

## 2018-08-05 DIAGNOSIS — N182 Chronic kidney disease, stage 2 (mild): Secondary | ICD-10-CM | POA: Diagnosis not present

## 2018-08-05 DIAGNOSIS — E860 Dehydration: Secondary | ICD-10-CM | POA: Diagnosis not present

## 2018-08-05 DIAGNOSIS — G20 Parkinson's disease: Secondary | ICD-10-CM | POA: Diagnosis not present

## 2018-08-05 DIAGNOSIS — I272 Pulmonary hypertension, unspecified: Secondary | ICD-10-CM | POA: Diagnosis not present

## 2018-08-08 DIAGNOSIS — R4189 Other symptoms and signs involving cognitive functions and awareness: Secondary | ICD-10-CM

## 2018-08-08 DIAGNOSIS — I10 Essential (primary) hypertension: Secondary | ICD-10-CM | POA: Diagnosis not present

## 2018-08-08 DIAGNOSIS — E039 Hypothyroidism, unspecified: Secondary | ICD-10-CM | POA: Diagnosis not present

## 2018-08-08 DIAGNOSIS — R531 Weakness: Secondary | ICD-10-CM

## 2018-08-08 DIAGNOSIS — E860 Dehydration: Secondary | ICD-10-CM | POA: Diagnosis not present

## 2018-08-08 DIAGNOSIS — R4182 Altered mental status, unspecified: Secondary | ICD-10-CM

## 2018-08-08 DIAGNOSIS — Z9181 History of falling: Secondary | ICD-10-CM

## 2018-08-08 DIAGNOSIS — G20 Parkinson's disease: Secondary | ICD-10-CM | POA: Diagnosis not present

## 2018-08-09 ENCOUNTER — Telehealth: Payer: Self-pay

## 2018-08-09 NOTE — Telephone Encounter (Signed)
Late Entry- incoming call was received late Friday 08/06/2018 Allyn with [facility] called to confirm that patient is still under the care of Dr.Reed and to see if Dr.Reed will sign orders. I informed Allyn that I need to check with patient's daughter to confirm that she will be returning to [facility] for it was my understanding that patient was moving to an assisted living facility.  I called Andrea Dorsey- patient's daughter and per Andrea Dorsey patient will be under the care of the doctor at the assisted living facility yet she has been unable to get that established. Andrea Dorsey will have to provide the facility with POA paperwork and she is in the process of getting that to them. Andrea Dorsey asked if Dr.Reed will sign the initial orders and any future order can go to her new PCP once established.   I called Allyn back and informed her of conversation that I had with Andrea Dorsey. Allyn will fax paperwork to [facility] sometime within the next week. Allyn is aware this will have to be further discussed with Dr.Reed and if she disagrees with this plan of action we will call her back with a status update.   Dr.Reed please agree or disagree to sign initial orders for patient.

## 2018-08-09 NOTE — Telephone Encounter (Signed)
Noted  Awaiting orders via fax

## 2018-08-09 NOTE — Telephone Encounter (Signed)
I'm happy to sign the initial orders, but if she is switching to the PCP at [facility], subsequent orders will need to be signed by that individual.  The orders currently should not be changed from when we last saw them since I wrote them for her when she stayed temporarily at the rest home in [place].

## 2018-08-10 DIAGNOSIS — E039 Hypothyroidism, unspecified: Secondary | ICD-10-CM | POA: Diagnosis not present

## 2018-08-10 DIAGNOSIS — G20 Parkinson's disease: Secondary | ICD-10-CM | POA: Diagnosis not present

## 2018-08-10 DIAGNOSIS — R531 Weakness: Secondary | ICD-10-CM | POA: Diagnosis not present

## 2018-08-10 DIAGNOSIS — A049 Bacterial intestinal infection, unspecified: Secondary | ICD-10-CM | POA: Diagnosis not present

## 2018-08-10 DIAGNOSIS — E860 Dehydration: Secondary | ICD-10-CM | POA: Diagnosis not present

## 2018-08-10 DIAGNOSIS — Z9181 History of falling: Secondary | ICD-10-CM | POA: Diagnosis not present

## 2018-08-10 DIAGNOSIS — I10 Essential (primary) hypertension: Secondary | ICD-10-CM | POA: Diagnosis not present

## 2018-08-10 DIAGNOSIS — K219 Gastro-esophageal reflux disease without esophagitis: Secondary | ICD-10-CM | POA: Diagnosis not present

## 2018-08-10 DIAGNOSIS — R1310 Dysphagia, unspecified: Secondary | ICD-10-CM | POA: Diagnosis not present

## 2018-08-11 DIAGNOSIS — G20 Parkinson's disease: Secondary | ICD-10-CM | POA: Diagnosis not present

## 2018-08-11 DIAGNOSIS — R531 Weakness: Secondary | ICD-10-CM | POA: Diagnosis not present

## 2018-08-11 DIAGNOSIS — E860 Dehydration: Secondary | ICD-10-CM | POA: Diagnosis not present

## 2018-08-11 DIAGNOSIS — E039 Hypothyroidism, unspecified: Secondary | ICD-10-CM | POA: Diagnosis not present

## 2018-08-11 DIAGNOSIS — Z9181 History of falling: Secondary | ICD-10-CM | POA: Diagnosis not present

## 2018-08-11 DIAGNOSIS — I10 Essential (primary) hypertension: Secondary | ICD-10-CM | POA: Diagnosis not present

## 2018-08-16 ENCOUNTER — Encounter: Payer: Self-pay | Admitting: Internal Medicine

## 2018-08-16 DIAGNOSIS — I5032 Chronic diastolic (congestive) heart failure: Secondary | ICD-10-CM

## 2018-08-16 DIAGNOSIS — R7611 Nonspecific reaction to tuberculin skin test without active tuberculosis: Secondary | ICD-10-CM

## 2018-08-16 DIAGNOSIS — D45 Polycythemia vera: Secondary | ICD-10-CM

## 2018-08-16 DIAGNOSIS — G20A1 Parkinson's disease without dyskinesia, without mention of fluctuations: Secondary | ICD-10-CM

## 2018-08-16 DIAGNOSIS — F0280 Dementia in other diseases classified elsewhere without behavioral disturbance: Secondary | ICD-10-CM

## 2018-08-16 DIAGNOSIS — G20 Parkinson's disease: Secondary | ICD-10-CM

## 2018-08-16 DIAGNOSIS — R739 Hyperglycemia, unspecified: Secondary | ICD-10-CM

## 2018-08-16 NOTE — Telephone Encounter (Signed)
Routed to Reed, Tiffany L, DO  

## 2018-08-17 ENCOUNTER — Telehealth: Payer: Self-pay | Admitting: Internal Medicine

## 2018-08-17 DIAGNOSIS — R627 Adult failure to thrive: Secondary | ICD-10-CM

## 2018-08-17 DIAGNOSIS — G20 Parkinson's disease: Secondary | ICD-10-CM

## 2018-08-17 DIAGNOSIS — G20A1 Parkinson's disease without dyskinesia, without mention of fluctuations: Secondary | ICD-10-CM

## 2018-08-17 DIAGNOSIS — I5032 Chronic diastolic (congestive) heart failure: Secondary | ICD-10-CM

## 2018-08-17 DIAGNOSIS — F0280 Dementia in other diseases classified elsewhere without behavioral disturbance: Secondary | ICD-10-CM

## 2018-08-17 DIAGNOSIS — F068 Other specified mental disorders due to known physiological condition: Secondary | ICD-10-CM

## 2018-08-17 NOTE — Telephone Encounter (Signed)
I'd like to know what's happening with her that she's "declining".  Can you please call and get more information?  I'm happy to order hospice if they feel that is what she needs.  I just need to understand more since I cannot see her in [place].  Also, [name] needs to be in agreement.

## 2018-08-17 NOTE — Telephone Encounter (Signed)
Andrea Dorsey with [facility] called and states that patient is declining.  Per Andrea Dorsey patient has pallative order, but they are requesting hospice order.  [phone]

## 2018-08-18 ENCOUNTER — Other Ambulatory Visit: Payer: Self-pay | Admitting: Internal Medicine

## 2018-08-18 DIAGNOSIS — E860 Dehydration: Secondary | ICD-10-CM | POA: Diagnosis not present

## 2018-08-18 DIAGNOSIS — E039 Hypothyroidism, unspecified: Secondary | ICD-10-CM | POA: Diagnosis not present

## 2018-08-18 DIAGNOSIS — G20 Parkinson's disease: Secondary | ICD-10-CM | POA: Diagnosis not present

## 2018-08-18 DIAGNOSIS — I10 Essential (primary) hypertension: Secondary | ICD-10-CM | POA: Diagnosis not present

## 2018-08-18 DIAGNOSIS — R531 Weakness: Secondary | ICD-10-CM | POA: Diagnosis not present

## 2018-08-18 DIAGNOSIS — Z9181 History of falling: Secondary | ICD-10-CM | POA: Diagnosis not present

## 2018-08-18 NOTE — Telephone Encounter (Signed)
Spoke with palative care, pt is not eating and drinking, 12lb weight loss, not taking meds and is very weak. Daughter is not wanting to take her back and forth to the hospital. She can be admitted to hospice on Sunday.

## 2018-08-18 NOTE — Telephone Encounter (Signed)
Ok, I agree with hospice care in that instance for her.  Perhaps, we could fax this series of notes to the proper place if a written order is required.

## 2018-08-19 NOTE — Telephone Encounter (Signed)
Order placed and external referral should print out.  Will need to be faxed asap.

## 2018-08-19 NOTE — Telephone Encounter (Signed)
They need a written order for hospice admission on sunday

## 2018-09-16 ENCOUNTER — Ambulatory Visit: Payer: Medicare Other | Admitting: Neurology

## 2018-10-21 DEATH — deceased

## 2018-10-25 ENCOUNTER — Ambulatory Visit: Payer: Self-pay

## 2018-10-25 ENCOUNTER — Encounter: Payer: Self-pay | Admitting: Family

## 2018-10-26 ENCOUNTER — Encounter: Payer: Self-pay | Admitting: Family
